# Patient Record
Sex: Female | Born: 1975 | Race: Black or African American | Hispanic: No | Marital: Married | Smoking: Former smoker
Health system: Southern US, Community
[De-identification: ages and names within clinical notes are randomized; demographics above are authoritative.]

## PROBLEM LIST (undated history)

## (undated) DIAGNOSIS — Z87448 Personal history of other diseases of urinary system: Secondary | ICD-10-CM

## (undated) DIAGNOSIS — N926 Irregular menstruation, unspecified: Secondary | ICD-10-CM

## (undated) DIAGNOSIS — K219 Gastro-esophageal reflux disease without esophagitis: Secondary | ICD-10-CM

## (undated) DIAGNOSIS — R7301 Impaired fasting glucose: Secondary | ICD-10-CM

## (undated) DIAGNOSIS — IMO0002 Reserved for concepts with insufficient information to code with codable children: Secondary | ICD-10-CM

## (undated) DIAGNOSIS — G43709 Chronic migraine without aura, not intractable, without status migrainosus: Secondary | ICD-10-CM

## (undated) DIAGNOSIS — G5600 Carpal tunnel syndrome, unspecified upper limb: Secondary | ICD-10-CM

## (undated) DIAGNOSIS — Z973 Presence of spectacles and contact lenses: Secondary | ICD-10-CM

## (undated) DIAGNOSIS — J309 Allergic rhinitis, unspecified: Secondary | ICD-10-CM

## (undated) DIAGNOSIS — E785 Hyperlipidemia, unspecified: Secondary | ICD-10-CM

## (undated) DIAGNOSIS — I10 Essential (primary) hypertension: Secondary | ICD-10-CM

## (undated) HISTORY — DX: Essential (primary) hypertension: I10

## (undated) HISTORY — DX: Reserved for concepts with insufficient information to code with codable children: IMO0002

## (undated) HISTORY — DX: Chronic migraine without aura, not intractable, without status migrainosus: G43.709

## (undated) HISTORY — DX: Carpal tunnel syndrome, unspecified upper limb: G56.00

## (undated) HISTORY — DX: Impaired fasting glucose: R73.01

## (undated) HISTORY — DX: Gastro-esophageal reflux disease without esophagitis: K21.9

## (undated) HISTORY — PX: CERVICAL DISC ARTHROPLASTY: SHX587

## (undated) HISTORY — DX: Allergic rhinitis, unspecified: J30.9

## (undated) HISTORY — DX: Hyperlipidemia, unspecified: E78.5

## (undated) HISTORY — DX: Personal history of other diseases of urinary system: Z87.448

## (undated) HISTORY — DX: Irregular menstruation, unspecified: N92.6

## (undated) HISTORY — PX: BACK SURGERY: SHX140

---

## 2014-08-14 LAB — HM PAP SMEAR

## 2014-08-23 ENCOUNTER — Ambulatory Visit: Payer: Self-pay | Admitting: Family Medicine

## 2014-08-23 LAB — HM MAMMOGRAPHY

## 2014-09-08 ENCOUNTER — Ambulatory Visit: Payer: Self-pay | Admitting: Family Medicine

## 2015-01-31 ENCOUNTER — Ambulatory Visit: Payer: Self-pay

## 2015-02-01 ENCOUNTER — Other Ambulatory Visit: Payer: Self-pay | Admitting: Family Medicine

## 2015-02-01 DIAGNOSIS — R928 Other abnormal and inconclusive findings on diagnostic imaging of breast: Secondary | ICD-10-CM

## 2015-02-05 ENCOUNTER — Ambulatory Visit: Payer: Self-pay

## 2015-02-06 ENCOUNTER — Telehealth: Payer: Self-pay | Admitting: Family Medicine

## 2015-02-06 DIAGNOSIS — R928 Other abnormal and inconclusive findings on diagnostic imaging of breast: Secondary | ICD-10-CM

## 2015-02-06 NOTE — Telephone Encounter (Signed)
-----   Message from Arnetha Courser, MD sent at 02/01/2015 11:22 AM EDT ----- Regarding: Due for 6 month f/u LEFT breast mammo and [place] Due for LEFT breast mammo and [place] around February 26, 2015

## 2015-02-06 NOTE — Telephone Encounter (Signed)
Ok, per [facility], we still have to place the order for f/u and we still have to call them after we've placed the order to get her scheduled. They do NOT call the patient to schedule follow up mammo and ultrasounds.

## 2015-02-06 NOTE — Telephone Encounter (Signed)
Patient due for f/u breast imaging around June 27th Please make sure imaging center is taking care of this, or do we need to enter orders through EPIC? Thank you

## 2015-02-07 ENCOUNTER — Ambulatory Visit: Payer: BC Managed Care – PPO | Attending: Family Medicine

## 2015-02-07 DIAGNOSIS — G5701 Lesion of sciatic nerve, right lower limb: Secondary | ICD-10-CM | POA: Diagnosis present

## 2015-02-07 DIAGNOSIS — M545 Low back pain: Secondary | ICD-10-CM | POA: Insufficient documentation

## 2015-02-07 DIAGNOSIS — M5431 Sciatica, right side: Secondary | ICD-10-CM | POA: Insufficient documentation
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
## 2015-02-07 NOTE — Patient Instructions (Signed)
Patient was instructed to use a lumbar towel support when sitting on a chair. Patient verbalized understanding.   Improved exercise technique, movement at target joints, use of target muscles after mod verbal, visual, tactile cues.

## 2015-02-12 ENCOUNTER — Ambulatory Visit: Payer: BC Managed Care – PPO

## 2015-02-12 DIAGNOSIS — M545 Low back pain: Secondary | ICD-10-CM | POA: Diagnosis not present

## 2015-02-12 DIAGNOSIS — G5701 Lesion of sciatic nerve, right lower limb: Secondary | ICD-10-CM

## 2015-02-12 DIAGNOSIS — M5431 Sciatica, right side: Secondary | ICD-10-CM

## 2015-02-12 NOTE — Therapy (Signed)
[place] [facility] [address] [place], [address] Phone: [phone]   Fax:  [phone]  Physical Therapy Treatment  Patient Details  Name: Carrie Cross MRN: [id number] Date of Birth: [birth year] Referring Provider:  Arnetha Courser, MD  Encounter Date: 02/12/2015      PT End of Session - 02/12/15 1547    Visit Number 2   Number of Visits 13   Date for PT Re-Evaluation 03/21/15   PT Start Time 6812   PT Stop Time 1645   PT Time Calculation (min) 60 min   Activity Tolerance Patient tolerated treatment well   Behavior During Therapy [facility] for tasks assessed/performed      Past Medical History  Diagnosis Date  . Hypertension     Controlled per pt    No past surgical history on file.  There were no vitals filed for this visit.  Visit Diagnosis:  Piriformis syndrome, right  Sciatica, right  Bilateral low back pain, with sciatica presence unspecified      Subjective Assessment - 02/12/15 1609    Subjective 4/10 R hip and LE pain currently. R hip and LE pain felt better after last session.    Patient Stated Goals Patient states she wants to be better able to bring her R leg up. Also expresses the desire to decrease her pain.    Currently in Pain? Yes   Pain Score 4    Pain Location Other (Comment)  Posterior hip, thigh, lateral leg, foot   Pain Orientation Right   Pain Onset More than a month ago   Pain Frequency Constant   Multiple Pain Sites Yes          [facility] Adult PT Treatment/Exercise - 02/12/15 1610    Lumbar Exercises: Standing   Other Standing Lumbar Exercises Standing bilateral scapular retraction with red band 10x5 seconds for 3 sets, standing wobble board ankle DF/PF x 45 seconds   Lumbar Exercises: Seated   Other Seated Lumbar Exercises Sitting with lumbar towel roll x 3 min, sitting on pysioball anterior to neutral pelvic rocking 10x, seated L hip extension isometrics 10x2  with 5 second holds, seated R hip extension isometrics 10x3 with 5 second holds, seated R piriformis stretch 30 seconds x 3.    Lumbar Exercises: Prone   Other Prone Lumbar Exercises Pone glute max/quad set 10x3 with 5 second holds, pone manual R piriformis stretch 30 seconds x 3, prone on elbows with 10 exhales.    Manual Therapy   Manual therapy comments Prone P to A to R L 5, L4, L3 transverse process grade 3- to 3+.           PT Education - 02/12/15 2108    Education provided Yes   Education Details ther-ex, HEP   Person(s) Educated Patient   Methods Explanation;Demonstration;Tactile cues;Verbal cues;Handout   Comprehension Verbalized understanding;Returned demonstration             PT Long Term Goals - 02/07/15 1943    PT LONG TERM GOAL #1   Title Patient will be independent with her HEP to help decrease back pain, improve ability to sit, sleep, and laugh.    Time 6   Period Weeks   Status New   PT LONG TERM GOAL #2   Title Patient will have a decrease in back pain to 4/10 or less at worst to improve ability to tolerate sitting for longer durations to perform sitting tasks  Time 6   Period Weeks   Status New   PT LONG TERM GOAL #3   Title Patient will improve her Modified Oswestry Low Back Pain Disability Questionnaire by at least 6 points as a demonstration of improved function.    Time 6   Period Weeks   Status New   PT LONG TERM GOAL #4   Title Patient will improve bilateral glute max strength by at least 1/2 MMT to decrease overuse of R piriformis muscle and to help decrease R LE pain.    Time 6   Period Weeks   Status New               Plan - 02/12/15 1636    Clinical Impression Statement Decreased R LE pain to 1/10 after manual therapy to R L3-5 transverse processes. Increased R LE symptoms with prone on elbows with deep breaths, pelvic rocks on physioball, prone glute max/quad sets, standing ankle DF/PF. Slight decrease in R LE pain after seated R  hip extension isometrics. Decreased to 1/10 after sitting piriformis stretch.    Pt will benefit from skilled therapeutic intervention in order to improve on the following deficits Decreased range of motion;Difficulty walking;Pain;Hypomobility;Improper body mechanics;Impaired flexibility;Decreased strength   Rehab Potential Good   Clinical Impairments Affecting Rehab Potential pain   PT Frequency 2x / week   PT Duration 6 weeks   PT Treatment/Interventions ADLs/Self Care Home Management;Aquatic Therapy;Traction;Neuromuscular re-education;Gait training;Patient/family education;Passive range of motion;Cryotherapy;Electrical Stimulation;Iontophoresis 4mg /ml Dexamethasone;Therapeutic activities;Manual techniques;Therapeutic exercise;Moist Heat   PT Next Visit Plan R piriformis stretch, R glute max strengthening in seated, lumbar and thoracic mobility   Consulted and Agree with Plan of Care Patient        Problem List There are no active problems to display for this patient.   Tc Kapusta 02/12/2015, 9:18 PM  [place] [facility] [address], [place], [address] Phone: [phone]   Fax:  [phone]

## 2015-02-12 NOTE — Patient Instructions (Addendum)
  Piriformis Stretch, Sitting   Sit, one ankle on opposite knee,crossed-leg position. Hold __30_ seconds.  Repeat _3__ times per session. Do __2_ sessions per day.  Copyright  VHI. All rights reserved.    Improved exercise technique, movement at target joints, use of target muscles after mod verbal, visual, tactile cues.

## 2015-02-14 ENCOUNTER — Ambulatory Visit: Payer: BC Managed Care – PPO

## 2015-02-14 DIAGNOSIS — M545 Low back pain: Secondary | ICD-10-CM | POA: Diagnosis not present

## 2015-02-14 DIAGNOSIS — M5431 Sciatica, right side: Secondary | ICD-10-CM

## 2015-02-14 DIAGNOSIS — G5701 Lesion of sciatic nerve, right lower limb: Secondary | ICD-10-CM

## 2015-02-14 NOTE — Therapy (Signed)
[place] [facility] [address] [place], [address] Phone: [phone]   Fax:  [phone]  Physical Therapy Treatment  Patient Details  Name: Carrie Cross MRN: [id number] Date of Birth: 1976-07-25 Referring Provider:  Arnetha Courser, MD  Encounter Date: 02/14/2015      PT End of Session - 02/14/15 1540    Visit Number 3   Number of Visits 13   Date for PT Re-Evaluation 03/21/15   PT Start Time 1540   PT Stop Time 1644   PT Time Calculation (min) 64 min   Activity Tolerance Patient tolerated treatment well;Patient limited by pain   Behavior During Therapy [facility] for tasks assessed/performed      Past Medical History  Diagnosis Date  . Hypertension     Controlled per pt    No past surgical history on file.  There were no vitals filed for this visit.  Visit Diagnosis:  Bilateral low back pain, with sciatica presence unspecified  Sciatica, right  Piriformis syndrome, right      Subjective Assessment - 02/14/15 1542    Subjective 3-4/10 R side current, 7/10 yesterday constant since waking up with it in the morning. Sleeping is hard for her because of her pain.    Patient Stated Goals Patient states she wants to be better able to bring her R leg up. Also expresses the desire to decrease her pain.    Currently in Pain? Yes   Pain Score 4                          [facility] Adult PT Treatment/Exercise - 02/14/15 1608    Exercises   Other Exercises  Directed patient with seated R piriformis stretch 30 seconds x3, supine manual R piriformis stretch (by PT) 6x15 seconds, supine manually resisted R hip extension isometrics at end range hip flexion 10x3 with 5 seconds, supine manually resisted clam shell in neutral 10x3 with 5 seconds, supine bilateral shoulder extension isometrics in supine 10x5 seconds, supine hip clam shell resisting yellow band to neutral 10x3. supine hooklying position  with lumbar towel roll x 2 min, the with legs straight x 4 min, sitting with lumbar towel roll x 4 min (2 min no charge)   Manual Therapy   Manual therapy comments supine manual lumbar traction                PT Education - 02/14/15 1636    Education provided Yes   Education Details ther-ex, HEP   Person(s) Educated Patient   Methods Explanation;Demonstration;Tactile cues;Verbal cues   Comprehension Verbalized understanding;Returned demonstration             PT Long Term Goals - 02/07/15 1943    PT LONG TERM GOAL #1   Title Patient will be independent with her HEP to help decrease back pain, improve ability to sit, sleep, and laugh.    Time 6   Period Weeks   Status New   PT LONG TERM GOAL #2   Title Patient will have a decrease in back pain to 4/10 or less at worst to improve ability to tolerate sitting for longer durations to perform sitting tasks   Time 6   Period Weeks   Status New   PT LONG TERM GOAL #3   Title Patient will improve her Modified Oswestry Low Back Pain Disability Questionnaire by at least 6 points as a demonstration of improved  function.    Time 6   Period Weeks   Status New   PT LONG TERM GOAL #4   Title Patient will improve bilateral glute max strength by at least 1/2 MMT to decrease overuse of R piriformis muscle and to help decrease R LE pain.    Time 6   Period Weeks   Status New               Plan - 02/14/15 1628    Clinical Impression Statement Increased R LE symptoms with supine clamshells past neutral with yellow band, manual lumbar traction, bridge. Increased pain with supine to sit when getting up from the L side of the mat table. No symptoms with supine clamshell isometrics at neutral, or supine position with lumbar towel roll (for gentle lumbar extension which decreased R LE symptoms to 1-2/10).  Demonstrates disc, right L5/S1 joint  and R piriformis involvement.  Disc involvement more predominant today.  Decreased symptoms  to 1/10  with sitting with lumbar towel roll as well.    Pt will benefit from skilled therapeutic intervention in order to improve on the following deficits Decreased range of motion;Difficulty walking;Pain;Hypomobility;Improper body mechanics;Impaired flexibility;Decreased strength   Rehab Potential Good   Clinical Impairments Affecting Rehab Potential pain   PT Frequency 2x / week   PT Treatment/Interventions ADLs/Self Care Home Management;Aquatic Therapy;Traction;Neuromuscular re-education;Gait training;Patient/family education;Passive range of motion;Cryotherapy;Electrical Stimulation;Iontophoresis 4mg /ml Dexamethasone;Therapeutic activities;Manual techniques;Therapeutic exercise;Moist Heat   PT Next Visit Plan R piriformis stretch, R glute max strengthening in seated, lumbar and thoracic mobility   Consulted and Agree with Plan of Care Patient        Problem List There are no active problems to display for this patient.   Azul Coffie 02/14/2015, 4:59 PM  [place] [facility] [address], [place], [address] Phone: [phone]   Fax:  [phone]

## 2015-02-14 NOTE — Patient Instructions (Signed)
Patient was instructed to try lying on her back with a towel roll behind her low back to help her sleep. Patient verbalized understanding.    Improved exercise technique, movement at target joints, use of target muscles after mod verbal, visual, tactile cues.

## 2015-02-19 ENCOUNTER — Ambulatory Visit: Payer: BC Managed Care – PPO

## 2015-02-19 ENCOUNTER — Other Ambulatory Visit: Payer: Self-pay | Admitting: Family Medicine

## 2015-02-19 DIAGNOSIS — M545 Low back pain: Secondary | ICD-10-CM

## 2015-02-19 DIAGNOSIS — G5701 Lesion of sciatic nerve, right lower limb: Secondary | ICD-10-CM

## 2015-02-19 DIAGNOSIS — M5431 Sciatica, right side: Secondary | ICD-10-CM

## 2015-02-19 NOTE — Patient Instructions (Signed)
Gave L S/L R trunk rotation with L LE straight static position and R S/L with towel roll or pillow under R trunk at lumbar area static hold as part of her HEP to help her sleep. Patient demonstrated and verbalized understanding.   Improved exercise technique, movement at target joints, use of target muscles after mod verbal, visual, tactile cues.

## 2015-02-19 NOTE — Therapy (Signed)
[place] [facility] [address] [place], [address] Phone: [phone]   Fax:  [phone]  Physical Therapy Treatment  Patient Details  Name: Carrie Cross MRN: [id number] Date of Birth: [birth year] Referring Provider:  Arnetha Courser, MD  Encounter Date: 02/19/2015      PT End of Session - 02/19/15 1552    Visit Number 4   Number of Visits 13   Date for PT Re-Evaluation 03/21/15   PT Start Time 1548   PT Stop Time 1643   PT Time Calculation (min) 55 min   Activity Tolerance Patient tolerated treatment well;Patient limited by pain   Behavior During Therapy [facility] for tasks assessed/performed      Past Medical History  Diagnosis Date  . Hypertension     Controlled per pt    No past surgical history on file.  There were no vitals filed for this visit.  Visit Diagnosis:  Sciatica, right  Piriformis syndrome, right  Bilateral low back pain, with sciatica presence unspecified      Subjective Assessment - 02/19/15 1553    Subjective 5/10 current R LE. Sleeps on her L side. Has difficulty sleeping becuase of the pain.    Patient Stated Goals Patient states she wants to be better able to bring her R leg up. Also expresses the desire to decrease her pain.    Currently in Pain? Yes   Pain Score 5    Pain Location --  Posterior hip, thigh, lateral leg, foot   Pain Orientation Right   Pain Onset More than a month ago   Multiple Pain Sites Yes                         [facility] Adult PT Treatment/Exercise - 02/19/15 1600    Exercises   Other Exercises  Directed patient with sitting with lumbar towel roll behind back for 6 minutes, L S/L position with towel roll under L trunk x 2 min, L S/L static bow and arrow position (R trunk and lumbar rotation with L LE straight) x 8 min, then with R ankle DF/PF 10x3, R S/L with towel roll under trunk x 8 minutes, then with R ankle DF/PF 10x3, standing R  and L back extension gently 6x each side, standing bilateral shoulder extension resisting yellow band 10x 5 seconds, sitting R piriformis stretch with lumbar towel roll 3x 30 seconds R LE, seated bilateral hip adduction pillow squeeze 10x3 with 5 second holds, standing mini squats 10x3 with emphasis on femoral control and glute use (and knees not going over toes)                PT Education - 02/19/15 1633    Education provided Yes   Education Details ther-ex, HEP   Person(s) Educated Patient   Methods Explanation;Demonstration;Tactile cues;Verbal cues;Handout   Comprehension Verbalized understanding;Returned demonstration             PT Long Term Goals - 02/07/15 1943    PT LONG TERM GOAL #1   Title Patient will be independent with her HEP to help decrease back pain, improve ability to sit, sleep, and laugh.    Time 6   Period Weeks   Status New   PT LONG TERM GOAL #2   Title Patient will have a decrease in back pain to 4/10 or less at worst to improve ability to tolerate sitting for longer durations to perform  sitting tasks   Time 6   Period Weeks   Status New   PT LONG TERM GOAL #3   Title Patient will improve her Modified Oswestry Low Back Pain Disability Questionnaire by at least 6 points as a demonstration of improved function.    Time 6   Period Weeks   Status New   PT LONG TERM GOAL #4   Title Patient will improve bilateral glute max strength by at least 1/2 MMT to decrease overuse of R piriformis muscle and to help decrease R LE pain.    Time 6   Period Weeks   Status New               Plan - 02/19/15 1627    Clinical Impression Statement R LE pain decreased to 4/10 after sitting with lumbar towel roll, decreased to 2/10 after L S/L L LE straight with R trunk rotation, decreased to 1/10 after R S/L with towel roll under R side at lumbar spine. R LE pain 1/10 at end of session. Patient tolerated static positions at beginning of therapy well.    Pt  will benefit from skilled therapeutic intervention in order to improve on the following deficits Decreased range of motion;Difficulty walking;Pain;Hypomobility;Improper body mechanics;Impaired flexibility;Decreased strength   Rehab Potential Good   Clinical Impairments Affecting Rehab Potential pain   PT Frequency 2x / week   PT Duration 6 weeks   PT Treatment/Interventions ADLs/Self Care Home Management;Aquatic Therapy;Traction;Neuromuscular re-education;Gait training;Patient/family education;Passive range of motion;Cryotherapy;Electrical Stimulation;Iontophoresis 4mg /ml Dexamethasone;Therapeutic activities;Manual techniques;Therapeutic exercise;Moist Heat   PT Next Visit Plan static positions to decrease disc symptoms followed by neural flossing and active exercises   Consulted and Agree with Plan of Care Patient        Problem List There are no active problems to display for this patient.   Halah Whiteside 02/19/2015, 5:05 PM  [place] [facility] [address] [place], [address] Phone: [phone]   Fax:  [phone]

## 2015-02-19 NOTE — Telephone Encounter (Signed)
E-Fax came through for refill on: Rx: omeprazole (PRILOSEC) 20 MG capsule Rx in basket

## 2015-02-19 NOTE — Telephone Encounter (Signed)
Routing to provider  

## 2015-02-21 ENCOUNTER — Ambulatory Visit: Payer: BC Managed Care – PPO

## 2015-02-21 DIAGNOSIS — M5431 Sciatica, right side: Secondary | ICD-10-CM

## 2015-02-21 DIAGNOSIS — M545 Low back pain: Secondary | ICD-10-CM

## 2015-02-21 DIAGNOSIS — G5701 Lesion of sciatic nerve, right lower limb: Secondary | ICD-10-CM

## 2015-02-21 MED ORDER — OMEPRAZOLE 20 MG PO CPDR: 20.0000 mg | DELAYED_RELEASE_CAPSULE | Freq: Every day | ORAL | Status: DC | PRN

## 2015-02-21 NOTE — Therapy (Signed)
[place] [facility] [address], [place], [address] Phone: [phone]   Fax:  [phone]  Physical Therapy Treatment  Patient Details  Name: Carrie Cross MRN: [id number] Date of Birth: [birth year] Referring Provider:  Arnetha Courser, MD  Encounter Date: 02/21/2015      PT End of Session - 02/21/15 1549    Visit Number 5   Number of Visits 13   Date for PT Re-Evaluation 03/21/15   PT Start Time 3220   PT Stop Time 1640   PT Time Calculation (min) 51 min   Activity Tolerance Patient tolerated treatment well;Patient limited by pain   Behavior During Therapy [facility] for tasks assessed/performed      Past Medical History  Diagnosis Date  . Hypertension     Controlled per pt    No past surgical history on file.  There were no vitals filed for this visit.  Visit Diagnosis:  Sciatica, right  Piriformis syndrome, right  Bilateral low back pain, with sciatica presence unspecified      Subjective Assessment - 02/21/15 1553    Subjective 2/10 current. This morning was a 5/10. Better able to sleep longer. Back pain however steadily coming back up.    Patient Stated Goals Patient states she wants to be better able to bring her R leg up. Also expresses the desire to decrease her pain.    Currently in Pain? Yes   Pain Score 2    Pain Location Back   Multiple Pain Sites Yes                         [facility] Adult PT Treatment/Exercise - 02/21/15 1557    Exercises   Other Exercises  Directed patient with L S/L with pillow under lumbar spine 3.5 min, R S/L with R LE straight, with static L trunk rotation position x 4 min, then with R ankle DF/PF 30x, seated R piriformis stretch with lumbar towel roll 30 seconds x 3, standing bilateral shoulder extension with yellow band 10x3 with 5 second holds. standing mini squats 10x3, wedding march holding onto 8 lbs dumbells each hand 32 ft x 4, wobble board  ankle DF/PF 2 min   Manual Therapy   Manual therapy comments Prone UPA to R sacral ala, L5 and L4 transverse processes grade 3- to 3+  decreased back pain afterwards                PT Education - 02/21/15 1624    Education provided Yes   Education Details ther-ex   [employer]) Educated Patient   Methods Explanation;Demonstration;Verbal cues;Tactile cues   Comprehension Returned demonstration             PT Long Term Goals - 02/07/15 1943    PT LONG TERM GOAL #1   Title Patient will be independent with her HEP to help decrease back pain, improve ability to sit, sleep, and laugh.    Time 6   Period Weeks   Status New   PT LONG TERM GOAL #2   Title Patient will have a decrease in back pain to 4/10 or less at worst to improve ability to tolerate sitting for longer durations to perform sitting tasks   Time 6   Period Weeks   Status New   PT LONG TERM GOAL #3   Title Patient will improve her Modified Oswestry Low Back Pain Disability Questionnaire by at least 6  points as a demonstration of improved function.    Time 6   Period Weeks   Status New   PT LONG TERM GOAL #4   Title Patient will improve bilateral glute max strength by at least 1/2 MMT to decrease overuse of R piriformis muscle and to help decrease R LE pain.    Time 6   Period Weeks   Status New               Plan - 02/21/15 1625    Clinical Impression Statement Decreased R LE pain to 1/10 after R S/L with R LE straight and L trunk rotation. Decreased R LE pain after manual therapy to R low back. No R LE pain after session. Patient making progress towards goals.    Pt will benefit from skilled therapeutic intervention in order to improve on the following deficits Decreased range of motion;Difficulty walking;Pain;Hypomobility;Improper body mechanics;Impaired flexibility;Decreased strength   Rehab Potential Good   Clinical Impairments Affecting Rehab Potential pain   PT Frequency 2x / week   PT  Duration 6 weeks   PT Treatment/Interventions ADLs/Self Care Home Management;Aquatic Therapy;Traction;Neuromuscular re-education;Gait training;Patient/family education;Passive range of motion;Cryotherapy;Electrical Stimulation;Iontophoresis 4mg /ml Dexamethasone;Therapeutic activities;Manual techniques;Therapeutic exercise;Moist Heat   PT Next Visit Plan static positions to decrease disc symptoms followed by neural flossing and active exercises   Consulted and Agree with Plan of Care Patient        Problem List There are no active problems to display for this patient.   Laygo,Miguel 02/21/2015, 4:45 PM  [place] [facility] [address], [place], [address] Phone: [phone]   Fax:  [phone]

## 2015-02-21 NOTE — Patient Instructions (Signed)
Improved exercise technique, movement at target joints, use of target muscles after mod verbal, visual, tactile cues.   

## 2015-02-26 ENCOUNTER — Ambulatory Visit: Payer: BC Managed Care – PPO

## 2015-02-26 DIAGNOSIS — M5431 Sciatica, right side: Secondary | ICD-10-CM

## 2015-02-26 DIAGNOSIS — M545 Low back pain: Secondary | ICD-10-CM

## 2015-02-26 DIAGNOSIS — G5701 Lesion of sciatic nerve, right lower limb: Secondary | ICD-10-CM

## 2015-02-26 NOTE — Patient Instructions (Addendum)
(  Home) Hip: Internal Rotation - Sitting    Pull right foot out to the side. May hold chair to keep body still. Repeat __10__ times per set. Do ___3_ sets per session. Do __1__ session per day Use __0__ lb weights.  Copyright  VHI. All rights reserved.    Improved exercise technique, movement at target joints, use of target muscles after mod verbal, visual, tactile cues.

## 2015-02-27 ENCOUNTER — Telehealth: Payer: Self-pay

## 2015-02-27 DIAGNOSIS — R928 Other abnormal and inconclusive findings on diagnostic imaging of breast: Secondary | ICD-10-CM

## 2015-02-27 NOTE — Therapy (Signed)
[place] [facility] [address] [place], [address] Phone: [phone]   Fax:  [phone]  Physical Therapy Treatment  Patient Details  Name: Carrie Cross MRN: [id number] Date of Birth: 01-16-1976 Referring Provider:  Arnetha Courser, MD  Encounter Date: 02/26/2015      PT End of Session - 02/26/15 1541    Visit Number 6   Number of Visits 13   Date for PT Re-Evaluation 03/21/15   PT Start Time 6962   PT Stop Time 1635   PT Time Calculation (min) 53 min   Activity Tolerance Patient tolerated treatment well;Patient limited by pain   Behavior During Therapy [facility] for tasks assessed/performed      Past Medical History  Diagnosis Date  . Hypertension     Controlled per pt    No past surgical history on file.  There were no vitals filed for this visit.  Visit Diagnosis:  Sciatica, right  Piriformis syndrome, right  Bilateral low back pain, with sciatica presence unspecified      Subjective Assessment - 02/26/15 1546    Subjective Pt states that her R LE was fine after last session until that night in which her R LE was an 8/10. Sleeping is off and on but the length of time sleeping at once is a little longer.    Patient Stated Goals Patient states she wants to be better able to bring her R leg up. Also expresses the desire to decrease her pain.    Currently in Pain? Yes   Pain Score 5    Pain Location --  back, R LE   Pain Orientation Right   Multiple Pain Sites Yes            [facility] Adult PT Treatment/Exercise - 02/26/15 1550    Exercises   Other Exercises  Directed patient with sitting with lumbar towel roll x 2 minutes, then with R piriformis stretch 30 seconds x 3, R S/L with R LE straight and static L trunk rotation 4 min, prone glute max squeeze with R knee bent 10x with 5 second holds, then with R thigh propped with pillow 10x2 with 5 second holds. seated R hip IR 10x3, standing  wobble board ankle DF/PF 2 min.    Manual Therapy   Manual therapy comments Prone UPA to R sacral ala, L5 and L4 transverse processes grade 3- to 3+, prone R piriformis stretch with muscle energy technique to increase piriformis ROM.            PT Education - 02/27/15 1736    Education provided Yes   Education Details ther-ex, HEP   Person(s) Educated Patient   Methods Explanation;Demonstration;Tactile cues;Verbal cues;Handout   Comprehension Verbalized understanding;Returned demonstration             PT Long Term Goals - 02/07/15 1943    PT LONG TERM GOAL #1   Title Patient will be independent with her HEP to help decrease back pain, improve ability to sit, sleep, and laugh.    Time 6   Period Weeks   Status New   PT LONG TERM GOAL #2   Title Patient will have a decrease in back pain to 4/10 or less at worst to improve ability to tolerate sitting for longer durations to perform sitting tasks   Time 6   Period Weeks   Status New   PT LONG TERM GOAL #3   Title Patient will improve  her Modified Oswestry Low Back Pain Disability Questionnaire by at least 6 points as a demonstration of improved function.    Time 6   Period Weeks   Status New   PT LONG TERM GOAL #4   Title Patient will improve bilateral glute max strength by at least 1/2 MMT to decrease overuse of R piriformis muscle and to help decrease R LE pain.    Time 6   Period Weeks   Status New               Plan - 02/26/15 1638    Clinical Impression Statement Decreased R LE pain with prone R piriformis stretch and seated R hip IR exercise   Pt will benefit from skilled therapeutic intervention in order to improve on the following deficits Decreased range of motion;Difficulty walking;Pain;Hypomobility;Improper body mechanics;Impaired flexibility;Decreased strength   Rehab Potential Good   Clinical Impairments Affecting Rehab Potential pain   PT Frequency 2x / week   PT Duration 6 weeks   PT  Treatment/Interventions ADLs/Self Care Home Management;Aquatic Therapy;Traction;Neuromuscular re-education;Gait training;Patient/family education;Passive range of motion;Cryotherapy;Electrical Stimulation;Iontophoresis 4mg /ml Dexamethasone;Therapeutic activities;Manual techniques;Therapeutic exercise;Moist Heat   PT Next Visit Plan static positions to decrease disc symptoms followed by neural flossing and active exercises, pirifomis stretching   Consulted and Agree with Plan of Care Patient        Problem List There are no active problems to display for this patient.   Ghazal Pevey 02/27/2015, 5:39 PM  [place] [facility] [address], [place], [address] Phone: [phone]   Fax:  [phone]

## 2015-02-27 NOTE — Telephone Encounter (Signed)
She is not pregnant.

## 2015-02-27 NOTE — Telephone Encounter (Signed)
-----   Message from Arnetha Courser, MD sent at 02/27/2015 12:28 PM EDT ----- Regarding: FW: Time to order breast imaging for 6 month f/u Please contact patient, ask about pregnancy, enter orders and I'll co-sign (please); thank you, Dr. Sanda Klein ----- Message -----    From: Arnetha Courser, MD    Sent: 02/06/2015   2:56 PM      To: Arnetha Courser, MD Subject: Time to order breast imaging for 6 month f/u   Left breast US and diag mammo due around July 8th; ask patient first before we order if she is / could be pregnant, then back to me to order

## 2015-02-28 ENCOUNTER — Ambulatory Visit: Payer: BC Managed Care – PPO

## 2015-02-28 DIAGNOSIS — M545 Low back pain: Secondary | ICD-10-CM

## 2015-02-28 DIAGNOSIS — M5431 Sciatica, right side: Secondary | ICD-10-CM

## 2015-02-28 DIAGNOSIS — G5701 Lesion of sciatic nerve, right lower limb: Secondary | ICD-10-CM

## 2015-02-28 NOTE — Therapy (Signed)
[place] [facility] [address] [place], [address] Phone: [phone]   Fax:  [phone]  Physical Therapy Treatment  Patient Details  Name: Carrie Cross MRN: [id number] Date of Birth: November 11, 1975 Referring Provider:  Arnetha Courser, MD  Encounter Date: 02/28/2015      PT End of Session - 02/28/15 1552    Visit Number 7   Number of Visits 13   Date for PT Re-Evaluation 03/21/15   PT Start Time 4401   PT Stop Time 1630   PT Time Calculation (min) 38 min   Activity Tolerance Patient tolerated treatment well;Patient limited by pain   Behavior During Therapy [facility] for tasks assessed/performed      Past Medical History  Diagnosis Date  . Hypertension     Controlled per pt    No past surgical history on file.  There were no vitals filed for this visit.  Visit Diagnosis:  Sciatica, right  Piriformis syndrome, right  Bilateral low back pain, with sciatica presence unspecified      Subjective Assessment - 02/28/15 1553    Subjective 4/10 currently R LE. The exercises last session helped but when she got to her car her and sat down, her sympoms increased. Getting out of the car usually bothers her R LE more than being in the car.    Patient Stated Goals Patient states she wants to be better able to bring her R leg up. Also expresses the desire to decrease her pain.    Currently in Pain? Yes   Pain Score 4    Pain Location --  R LE   Multiple Pain Sites Yes                         [facility] Adult PT Treatment/Exercise - 02/28/15 1615    Exercises   Other Exercises  Directed patient with seated exercises in car: seated hip adductor towel  squeeze 10x3 with 5 second holds, seated R hip extension isometrics 10x3 with 5 second holds, ergonomic car transfer with emphasis on proper back posture (no lumbar flexion) 3x; in gym: prone on elbows 2 min x 2, prone glute max/quad set 10x 5 second  holds for 3 sets, ergonomic prone to L S/L to sit to stand 1x with cues for keeping back neutral and use of glute muscles.                 PT Education - 02/28/15 2355    Education provided Yes   Education Details ther-ex, HEP   Person(s) Educated Patient   Methods Explanation;Demonstration;Tactile cues;Verbal cues;Handout   Comprehension Verbalized understanding;Returned demonstration             PT Long Term Goals - 02/07/15 1943    PT LONG TERM GOAL #1   Title Patient will be independent with her HEP to help decrease back pain, improve ability to sit, sleep, and laugh.    Time 6   Period Weeks   Status New   PT LONG TERM GOAL #2   Title Patient will have a decrease in back pain to 4/10 or less at worst to improve ability to tolerate sitting for longer durations to perform sitting tasks   Time 6   Period Weeks   Status New   PT LONG TERM GOAL #3   Title Patient will improve her Modified Oswestry Low Back Pain Disability Questionnaire by at least 6 points  as a demonstration of improved function.    Time 6   Period Weeks   Status New   PT LONG TERM GOAL #4   Title Patient will improve bilateral glute max strength by at least 1/2 MMT to decrease overuse of R piriformis muscle and to help decrease R LE pain.    Time 6   Period Weeks   Status New               Plan - 02/28/15 1618    Clinical Impression Statement Decreased back pain with seated exercises in car as well as with ergonomic car transfers. 0/10 R LE pain after prone on elbows and prone glute/quad set. Decreased back pain when getting out of the prone position on the L side with proper back posture and use of glute max   Pt will benefit from skilled therapeutic intervention in order to improve on the following deficits Decreased range of motion;Difficulty walking;Pain;Hypomobility;Improper body mechanics;Impaired flexibility;Decreased strength   Rehab Potential Good   Clinical Impairments Affecting  Rehab Potential pain   PT Frequency 2x / week   PT Duration 6 weeks   PT Treatment/Interventions ADLs/Self Care Home Management;Aquatic Therapy;Traction;Neuromuscular re-education;Gait training;Patient/family education;Passive range of motion;Cryotherapy;Electrical Stimulation;Iontophoresis 4mg /ml Dexamethasone;Therapeutic activities;Manual techniques;Therapeutic exercise;Moist Heat   PT Next Visit Plan decrease piriformis activation, increase glute max muscle use, promote lumbar extension, neural flossing   Consulted and Agree with Plan of Care Patient        Problem List There are no active problems to display for this patient.   Laygo,Miguel 02/28/2015, 11:59 PM  [place] [facility] [address] [place], [address] Phone: [phone]   Fax:  [phone]

## 2015-02-28 NOTE — Patient Instructions (Addendum)
Quadriceps Set (Prone)   With legs supported with pillow and ankles relaxed (not shown), tighten thigh muscles to straighten knees. Hold __5__ seconds. Relax. Repeat ___10_ times per set. Do _3___ sets per session. Do ___2_ sessions per day.  http://orth.exer.us/727   Copyright  VHI. All rights reserved.    Improved exercise technique, movement at target joints, use of target muscles after mod verbal, visual, tactile cues.

## 2015-03-02 ENCOUNTER — Other Ambulatory Visit: Payer: Self-pay | Admitting: Family Medicine

## 2015-03-02 DIAGNOSIS — R928 Other abnormal and inconclusive findings on diagnostic imaging of breast: Secondary | ICD-10-CM

## 2015-03-08 ENCOUNTER — Ambulatory Visit: Payer: BC Managed Care – PPO | Attending: Family Medicine

## 2015-03-08 DIAGNOSIS — G5701 Lesion of sciatic nerve, right lower limb: Secondary | ICD-10-CM | POA: Insufficient documentation

## 2015-03-08 DIAGNOSIS — M545 Low back pain: Secondary | ICD-10-CM

## 2015-03-08 DIAGNOSIS — M5431 Sciatica, right side: Secondary | ICD-10-CM | POA: Diagnosis not present

## 2015-03-08 NOTE — Patient Instructions (Addendum)
On Elbows (Prone)   Rise up on elbows as high as possible, keeping hips on floor. Hold _2___ minutes Repeat __3__ times per set. Do __1__ sets per session. Do __2__ sessions per day.  http://orth.exer.us/93   Copyright  VHI. All rights reserved.    Improved exercise technique, movement at target joints, use of target muscles after mod verbal, visual, tactile cues.

## 2015-03-08 NOTE — Therapy (Signed)
[facility] [address] [place], [address] Phone: [phone]   Fax:  [phone]  Physical Therapy Treatment  Patient Details  Name: Carrie Cross MRN: [id number] Date of Birth: May 17, 1976 Referring Provider:  Arnetha Courser, MD  Encounter Date: 03/08/2015      PT End of Session - 03/08/15 0803    Visit Number 8   Number of Visits 13   Date for PT Re-Evaluation 03/21/15   PT Start Time 0803   PT Stop Time 0859   PT Time Calculation (min) 56 min   Activity Tolerance Patient tolerated treatment well;Patient limited by pain   Behavior During Therapy [facility] for tasks assessed/performed      Past Medical History  Diagnosis Date  . Hypertension     Controlled per pt    No past surgical history on file.  There were no vitals filed for this visit.  Visit Diagnosis:  Sciatica, right  Bilateral low back pain, with sciatica presence unspecified  Piriformis syndrome, right      Subjective Assessment - 03/08/15 0807    Subjective 2/10 currently R LE. Used a sleep aid from [employer] to help her sleep. 9/10 at most at night with any type of movement, even when she is laying on her back and slides over. Able to go to sleep but feels her pain when she moves in her sleep.    Patient Stated Goals Patient states she wants to be better able to bring her R leg up. Also expresses the desire to decrease her pain.    Currently in Pain? Yes   Pain Score 2    Multiple Pain Sites Yes                         [facility] Adult PT Treatment/Exercise - 03/08/15 0809    Exercises   Other Exercises  Directed patient with L lateral shift correction 10x5 seconds, then 10x10 seconds, prone on elbows 3x2 min, prone glute max set 10x2 with 5 second holds each LE, prone manual R piriformis stretch 10x10 seconds for 2 sets, prone assisted R glute max extension 10x2, prone on elbows with manual pressure to R low back  2 min x 2, prone press ups with manual pressure to R low back 3x   Manual Therapy   Manual therapy comments Prone UPA to R sacral ala, R L5 and L4 transverse processes grade 3 to 3+  Decreased R LE pain to 0.5/10                PT Education - 03/08/15 0912    Education provided Yes   Education Details ther-ex, HEP   Person(s) Educated Patient   Methods Explanation;Demonstration;Tactile cues;Verbal cues;Handout   Comprehension Verbalized understanding;Returned demonstration             PT Long Term Goals - 03/08/15 0913    PT LONG TERM GOAL #1   Title Patient will be independent with her HEP to help decrease back pain, improve ability to sit, sleep, and laugh.    Time 6   Period Weeks   Status On-going   PT LONG TERM GOAL #2   Title Patient will have a decrease in back pain to 4/10 or less at worst to improve ability to tolerate sitting for longer durations to perform sitting tasks   Time 6   Period Weeks   Status On-going   PT LONG  TERM GOAL #3   Title Patient will improve her Modified Oswestry Low Back Pain Disability Questionnaire by at least 6 points as a demonstration of improved function.    Time 6   Period Weeks   Status On-going   PT LONG TERM GOAL #4   Title Patient will improve bilateral glute max strength by at least 1/2 MMT to decrease overuse of R piriformis muscle and to help decrease R LE pain.    Time 6   Period Weeks   Status On-going               Plan - 03/08/15 2595    Clinical Impression Statement Decrease R LE symptoms to 0.1/10 after session (after manual therapy to R sacral ala, L5, L4 transverse processes followed by prone on elbows x 2 min). Tolerated treatment focusing on herniated disc well. Continue with promoting lumbar extenson and mobility.    Pt will benefit from skilled therapeutic intervention in order to improve on the following deficits Decreased range of motion;Difficulty walking;Pain;Hypomobility;Improper body  mechanics;Impaired flexibility;Decreased strength   Rehab Potential Good   Clinical Impairments Affecting Rehab Potential pain   PT Frequency 2x / week   PT Duration 6 weeks   PT Treatment/Interventions ADLs/Self Care Home Management;Aquatic Therapy;Traction;Neuromuscular re-education;Gait training;Patient/family education;Passive range of motion;Cryotherapy;Electrical Stimulation;Iontophoresis 4mg /ml Dexamethasone;Therapeutic activities;Manual techniques;Therapeutic exercise;Moist Heat   PT Next Visit Plan decrease piriformis activation, increase glute max muscle use, promote lumbar extension, neural flossing   Consulted and Agree with Plan of Care Patient        Problem List There are no active problems to display for this patient.   Ceonna Frazzini 03/08/2015, 9:16 AM  [place] [facility] [address], [place], [address] Phone: [phone]   Fax:  [phone]

## 2015-03-12 ENCOUNTER — Ambulatory Visit
Admission: RE | Admit: 2015-03-12 | Discharge: 2015-03-12 | Disposition: A | Discharge status: Home / Self Care | Payer: BC Managed Care – PPO | Source: Ambulatory Visit | Attending: Family Medicine | Admitting: Family Medicine

## 2015-03-12 ENCOUNTER — Ambulatory Visit: Payer: BC Managed Care – PPO

## 2015-03-12 DIAGNOSIS — R928 Other abnormal and inconclusive findings on diagnostic imaging of breast: Secondary | ICD-10-CM | POA: Insufficient documentation

## 2015-03-13 ENCOUNTER — Ambulatory Visit: Payer: BC Managed Care – PPO

## 2015-03-13 DIAGNOSIS — M5431 Sciatica, right side: Secondary | ICD-10-CM | POA: Diagnosis not present

## 2015-03-13 DIAGNOSIS — G5701 Lesion of sciatic nerve, right lower limb: Secondary | ICD-10-CM

## 2015-03-13 DIAGNOSIS — M545 Low back pain: Secondary | ICD-10-CM

## 2015-03-14 NOTE — Patient Instructions (Addendum)
Improved exercise technique, movement at target joints, use of target muscles after mod verbal, visual, tactile cues.    Modified her prone on elbows HEP with R side bend. Pt verbalized and demonstrated understanding.

## 2015-03-14 NOTE — Therapy (Signed)
[facility] [facility] [address], [place], [address] Phone: [phone]   Fax:  [phone]  Physical Therapy Treatment  Patient Details  Name: Carrie Cross MRN: [id number] Date of Birth: October 25, 1975 Referring Provider:  Arnetha Courser, MD  Encounter Date: 03/13/2015      PT End of Session - 03/13/15 1523    Visit Number 8   Number of Visits 13   Date for PT Re-Evaluation 03/21/15   PT Start Time 1520   PT Stop Time 1610   PT Time Calculation (min) 50 min   Activity Tolerance Patient tolerated treatment well;Patient limited by pain   Behavior During Therapy [facility] for tasks assessed/performed      Past Medical History  Diagnosis Date  . Hypertension     Controlled per pt    No past surgical history on file.  There were no vitals filed for this visit.  Visit Diagnosis:  Sciatica, right  Bilateral low back pain, with sciatica presence unspecified  Piriformis syndrome, right      Subjective Assessment - 03/13/15 1524    Subjective 4/10 R LE current, 8-9/10 at worst past 3 days. When she moves her sympoms are better. For the past few days, her R LE feels tight. Getting into and out of the car is better. Getting out of her bed bothers her back.    Patient Stated Goals Patient states she wants to be better able to bring her R leg up. Also expresses the desire to decrease her pain.    Currently in Pain? Yes   Pain Score 4    Multiple Pain Sites Yes                         [facility] Adult PT Treatment/Exercise - 03/13/15 1527    Exercises   Other Exercises  Directed patient with sitting on lumbar towel roll 2 min, seated R piriformis stretch about 1 min x3, prone assisted R glute max extension 10x3, prone on elbows x 2 min, prone position with R side bend 2 min x3 (given as part or her HEP as a modification of her regular prone on elbow exercise; pt demonstrated and verbalized  understanding). Pt was also recommended to continue performing her HEP such as prone on elbows each time she feels her symptoms to try to increase the duration of her relief after exercises. Pt verbalized understanding. Sitting on pillow under R hip x 2 min.    Manual Therapy   Manual therapy comments Prone R UPA to L 5,4,3,2 transverse process grade 3 to 3+  decreased to 0/10 in prone after manual therapy                 PT Education - 03/14/15 1116    Education provided Yes   Education Details ther-ex   [employer]) Educated Patient   Methods Explanation;Demonstration;Verbal cues   Comprehension Verbalized understanding;Returned demonstration             PT Long Term Goals - 03/08/15 0913    PT LONG TERM GOAL #1   Title Patient will be independent with her HEP to help decrease back pain, improve ability to sit, sleep, and laugh.    Time 6   Period Weeks   Status On-going   PT LONG TERM GOAL #2   Title Patient will have a decrease in back pain to 4/10 or less at worst to improve ability  to tolerate sitting for longer durations to perform sitting tasks   Time 6   Period Weeks   Status On-going   PT LONG TERM GOAL #3   Title Patient will improve her Modified Oswestry Low Back Pain Disability Questionnaire by at least 6 points as a demonstration of improved function.    Time 6   Period Weeks   Status On-going   PT LONG TERM GOAL #4   Title Patient will improve bilateral glute max strength by at least 1/2 MMT to decrease overuse of R piriformis muscle and to help decrease R LE pain.    Time 6   Period Weeks   Status On-going               Plan - 03/13/15 1611    Clinical Impression Statement Decreased R LE pain after manual therapy and exercises in prone. Symptoms returned when transitioning to sitting.    Pt will benefit from skilled therapeutic intervention in order to improve on the following deficits Decreased range of motion;Difficulty  walking;Pain;Hypomobility;Improper body mechanics;Impaired flexibility;Decreased strength   Rehab Potential Good   Clinical Impairments Affecting Rehab Potential pain   PT Frequency 2x / week   PT Duration 6 weeks   PT Treatment/Interventions ADLs/Self Care Home Management;Aquatic Therapy;Traction;Neuromuscular re-education;Gait training;Patient/family education;Passive range of motion;Cryotherapy;Electrical Stimulation;Iontophoresis 4mg /ml Dexamethasone;Therapeutic activities;Manual techniques;Therapeutic exercise;Moist Heat   PT Next Visit Plan decrease piriformis activation, increase glute max muscle use, promote lumbar extension, neural flossing   Consulted and Agree with Plan of Care Patient        Problem List There are no active problems to display for this patient.   Latrease Kunde 03/14/2015, 11:25 AM  [place] [facility] [address], [place], [address] Phone: [phone]   Fax:  [phone]

## 2015-03-15 ENCOUNTER — Ambulatory Visit: Payer: BC Managed Care – PPO

## 2015-03-15 DIAGNOSIS — M5431 Sciatica, right side: Secondary | ICD-10-CM | POA: Diagnosis not present

## 2015-03-15 DIAGNOSIS — M545 Low back pain: Secondary | ICD-10-CM

## 2015-03-15 DIAGNOSIS — G5701 Lesion of sciatic nerve, right lower limb: Secondary | ICD-10-CM

## 2015-03-15 NOTE — Patient Instructions (Addendum)
Log Roll   Lying on back, bend left knee and place left arm across chest. Roll all in one movement to the right. Reverse to roll to the left. Always move as one unit.   Copyright  VHI. All rights reserved.   Improved exercise technique, movement at target joints, use of target muscles after mod verbal, visual, tactile cues.

## 2015-03-15 NOTE — Therapy (Signed)
[place] [facility] [address] [place], [address] Phone: [phone]   Fax:  [phone]  Physical Therapy Treatment  Patient Details  Name: ALEISHA PAONE MRN: [id number] Date of Birth: [birth year] Referring Provider:  Arnetha Courser, MD  Encounter Date: 03/15/2015      PT End of Session - 03/15/15 1521    Visit Number 9   Number of Visits 13   Date for PT Re-Evaluation 03/21/15   PT Start Time 8182   PT Stop Time 1603   PT Time Calculation (min) 42 min   Activity Tolerance Patient tolerated treatment well;Patient limited by pain   Behavior During Therapy [facility] for tasks assessed/performed      Past Medical History  Diagnosis Date  . Hypertension     Controlled per pt    No past surgical history on file.  There were no vitals filed for this visit.  Visit Diagnosis:  Sciatica, right  Bilateral low back pain, with sciatica presence unspecified  Piriformis syndrome, right      Subjective Assessment - 03/15/15 1521    Subjective 4/10 R LE current. 8/10 at most for the past 2 days.    Patient Stated Goals Patient states she wants to be better able to bring her R leg up. Also expresses the desire to decrease her pain.    Currently in Pain? Yes   Pain Score 4    Multiple Pain Sites Yes            [facility] PT Assessment - 03/15/15 1549    Observation/Other Assessments   Observations (+) long sit test suggesting posterior nutation of R innominate.                      [place] Adult PT Treatment/Exercise - 03/15/15 1537    Exercises   Other Exercises  Directed patient with log rolling: supine to R S/L 10x, supine to L S/L 10x, R S/L to sit 10x, L S/L to sit 10x. Pt education on back and disc when transitioning from L S/L to sit. Directed patient with prone on elbows with R trunk side bend 2 min, regular prone on elbows for 2 min, prone with R LE off table R hip extension 2x, supine  bridge 2x, supine SLR R hip flexion 10x then 10x2 with 5 second holds. supine mini single leg bridge L LE 10x, supine posterior pelvic tilt 10x2 with 5 second holds. seated L hip extension isometrics 10x5 seconds for 2 sets                PT Education - 03/15/15 2016    Education provided Yes   Education Details ther-ex, HEP   Person(s) Educated Patient   Methods Demonstration;Tactile cues;Explanation;Verbal cues;Handout   Comprehension Returned demonstration             PT Long Term Goals - 03/08/15 0913    PT LONG TERM GOAL #1   Title Patient will be independent with her HEP to help decrease back pain, improve ability to sit, sleep, and laugh.    Time 6   Period Weeks   Status On-going   PT LONG TERM GOAL #2   Title Patient will have a decrease in back pain to 4/10 or less at worst to improve ability to tolerate sitting for longer durations to perform sitting tasks   Time 6   Period Weeks   Status On-going   PT  LONG TERM GOAL #3   Title Patient will improve her Modified Oswestry Low Back Pain Disability Questionnaire by at least 6 points as a demonstration of improved function.    Time 6   Period Weeks   Status On-going   PT LONG TERM GOAL #4   Title Patient will improve bilateral glute max strength by at least 1/2 MMT to decrease overuse of R piriformis muscle and to help decrease R LE pain.    Time 6   Period Weeks   Status On-going               Plan - 03/15/15 1600    Clinical Impression Statement No increase in R LE pain when log rolling or performing supine <> sit both sides. 3.5/10 R LE pain after session.    Pt will benefit from skilled therapeutic intervention in order to improve on the following deficits Decreased range of motion;Difficulty walking;Pain;Hypomobility;Improper body mechanics;Impaired flexibility;Decreased strength   Rehab Potential Good   Clinical Impairments Affecting Rehab Potential pain   PT Frequency 2x / week   PT Duration  6 weeks   PT Treatment/Interventions ADLs/Self Care Home Management;Aquatic Therapy;Traction;Neuromuscular re-education;Gait training;Patient/family education;Passive range of motion;Cryotherapy;Electrical Stimulation;Iontophoresis 4mg /ml Dexamethasone;Therapeutic activities;Manual techniques;Therapeutic exercise;Moist Heat   PT Next Visit Plan proper back posture, decrease piriformis activation, increase glute max muscle use, promote lumbar extension, neural flossing   Consulted and Agree with Plan of Care Patient        Problem List There are no active problems to display for this patient.   Laygo,Miguel 03/15/2015, 8:19 PM  [place] [facility] [address], [place], [address] Phone: [phone]   Fax:  [phone]

## 2015-03-19 ENCOUNTER — Ambulatory Visit: Payer: BC Managed Care – PPO

## 2015-03-19 ENCOUNTER — Telehealth: Payer: Self-pay | Admitting: Family Medicine

## 2015-03-19 MED ORDER — OMEPRAZOLE 20 MG PO CPDR: 20.0000 mg | DELAYED_RELEASE_CAPSULE | Freq: Every day | ORAL | Status: DC | PRN

## 2015-03-19 NOTE — Telephone Encounter (Signed)
Rx: omeprazole (PRILOSEC) 20 MG capsule E-Fax came through for refill, copy in basket.

## 2015-03-21 ENCOUNTER — Ambulatory Visit: Payer: BC Managed Care – PPO

## 2015-03-28 ENCOUNTER — Telehealth: Payer: Self-pay

## 2015-03-28 NOTE — Telephone Encounter (Signed)
She has been being treated for a pulled muscle on her sciatic nerve. She has been going to PT for 1-2 months and it's not helping much and she can't afford to keep paying $70 for every trip to PT. She isn't sleeping due to the pain and wants to know if there is anything you can give her.

## 2015-03-28 NOTE — Telephone Encounter (Signed)
Patient left message to call. 

## 2015-03-28 NOTE — Telephone Encounter (Signed)
Left message for patient to call.

## 2015-03-29 MED ORDER — CYCLOBENZAPRINE HCL 10 MG PO TABS: 10.0000 mg | ORAL_TABLET | Freq: Three times a day (TID) | ORAL | Status: DC | PRN

## 2015-03-29 MED ORDER — GABAPENTIN 300 MG PO CAPS: 300.0000 mg | ORAL_CAPSULE | Freq: Every day | ORAL | Status: DC

## 2015-03-29 MED ORDER — PREDNISONE 10 MG PO TABS: ORAL_TABLET | ORAL | Status: AC

## 2015-03-29 NOTE — Telephone Encounter (Signed)
She had been seen and was referred to PT; went for almost two months; she still can't sleep; worse when sitting; really bad when she is in bed She has tried all the exercises, that works for a short time Therapist thinks more of a disc in back Let's try medicines, and if not helping in 1-2 weeks, come in for appt

## 2015-04-16 ENCOUNTER — Other Ambulatory Visit: Payer: Self-pay | Admitting: Family Medicine

## 2015-05-08 DIAGNOSIS — IMO0002 Reserved for concepts with insufficient information to code with codable children: Secondary | ICD-10-CM | POA: Insufficient documentation

## 2015-05-08 DIAGNOSIS — E785 Hyperlipidemia, unspecified: Secondary | ICD-10-CM | POA: Insufficient documentation

## 2015-05-08 DIAGNOSIS — G43709 Chronic migraine without aura, not intractable, without status migrainosus: Secondary | ICD-10-CM | POA: Insufficient documentation

## 2015-05-08 DIAGNOSIS — J309 Allergic rhinitis, unspecified: Secondary | ICD-10-CM | POA: Insufficient documentation

## 2015-05-08 DIAGNOSIS — G5600 Carpal tunnel syndrome, unspecified upper limb: Secondary | ICD-10-CM | POA: Insufficient documentation

## 2015-05-08 DIAGNOSIS — N926 Irregular menstruation, unspecified: Secondary | ICD-10-CM | POA: Insufficient documentation

## 2015-05-08 DIAGNOSIS — I10 Essential (primary) hypertension: Secondary | ICD-10-CM | POA: Insufficient documentation

## 2015-05-08 DIAGNOSIS — K219 Gastro-esophageal reflux disease without esophagitis: Secondary | ICD-10-CM | POA: Insufficient documentation

## 2015-05-08 DIAGNOSIS — R7301 Impaired fasting glucose: Secondary | ICD-10-CM | POA: Insufficient documentation

## 2015-05-11 ENCOUNTER — Ambulatory Visit (INDEPENDENT_AMBULATORY_CARE_PROVIDER_SITE_OTHER): Payer: BC Managed Care – PPO | Admitting: Family Medicine

## 2015-05-11 ENCOUNTER — Encounter: Payer: Self-pay | Admitting: Family Medicine

## 2015-05-11 VITALS — BP 125/84 | HR 86 | Temp 97.5°F | Wt 163.0 lb

## 2015-05-11 DIAGNOSIS — E663 Overweight: Secondary | ICD-10-CM

## 2015-05-11 DIAGNOSIS — I10 Essential (primary) hypertension: Secondary | ICD-10-CM

## 2015-05-11 DIAGNOSIS — J069 Acute upper respiratory infection, unspecified: Secondary | ICD-10-CM | POA: Diagnosis not present

## 2015-05-11 DIAGNOSIS — K219 Gastro-esophageal reflux disease without esophagitis: Secondary | ICD-10-CM

## 2015-05-11 DIAGNOSIS — M5416 Radiculopathy, lumbar region: Secondary | ICD-10-CM | POA: Diagnosis not present

## 2015-05-11 DIAGNOSIS — L309 Dermatitis, unspecified: Secondary | ICD-10-CM

## 2015-05-11 MED ORDER — METHOCARBAMOL 500 MG PO TABS: 500.0000 mg | ORAL_TABLET | Freq: Four times a day (QID) | ORAL | Status: DC | PRN

## 2015-05-11 MED ORDER — PREDNISONE 10 MG PO TABS: ORAL_TABLET | ORAL | Status: DC

## 2015-05-11 MED ORDER — TRIAMCINOLONE ACETONIDE 0.1 % EX CREA: 1.0000 "application " | TOPICAL_CREAM | Freq: Two times a day (BID) | CUTANEOUS | Status: DC | PRN

## 2015-05-11 MED ORDER — HYDROXYZINE HCL 25 MG PO TABS: 25.0000 mg | ORAL_TABLET | Freq: Every evening | ORAL | Status: DC | PRN

## 2015-05-11 NOTE — Assessment & Plan Note (Signed)
Praised patient for weight loss efforts, healthier eating

## 2015-05-11 NOTE — Progress Notes (Signed)
BP 125/84 mmHg  Pulse 86  Temp(Src) 97.5 F (36.4 C)  Wt 163 lb (73.936 kg)  SpO2 100%   Subjective:    Patient ID: Carrie Cross, female    DOB: [birth year], 39 y.o.   MRN: [id number]  HPI: Carrie Cross is a 39 y.o. female  Chief Complaint  Patient presents with  . Cough    Husband has pneumonia and now she has a cough  . Itching    bilateral arms  . Leg Pain    right leg   Her cough started a week ago; she coughs during the day, but more at night; wakes her up; no fevers; cough is nonproductive; having sore throat; just irritated; ears are popping, bothering her some, feels like something in them, like in a tunnel; some postnasal drip; not blowing out much through nose; no body aches  Having rash on the arms and legs; itches; daughter has eczema; eczema aveeno doesn't work; worse with hot weather; worse with hot water; going on for a month or so; started really small on the arms; taking benadryl and it still wakes her up; daughter affected by weather especially; no places on her trunk  She is having issues with her right side and hurts; she has been to physical therapy; they think it's her back; she kept going to him and it wasn't working; the only thing that gave her relief was the prednisone; during the day, she was okay, but at night, it is terrible; she can't cough because it hurts; the whole right hip is heavy and hurts to move; it will sometimes go all the way down to the foot; other times to just below the knee; no back pain; the PT is thinking something is up with her back; no muscle relaxers now; when she has tried them in the past, she can go back to sleep better; last round of prednisone was a month or two ago; patient is miserable; nothing is fixing it  She is losing weight on purpose; eating better; not really exercising, but clean eating, watching carbs and sugars; she does not have a target weight  Relevant past medical, surgical, family and social  history reviewed and updated as indicated. Interim medical history since our last visit reviewed. Allergies and medications reviewed and updated.  Review of Systems Per HPI unless specifically indicated above     Objective:    BP 125/84 mmHg  Pulse 86  Temp(Src) 97.5 F (36.4 C)  Wt 163 lb (73.936 kg)  SpO2 100%  Wt Readings from Last 3 Encounters:  05/11/15 163 lb (73.936 kg)  01/11/15 173 lb (78.472 kg)    Physical Exam  Constitutional: She appears well-developed and well-nourished. No distress.  HENT:  Head: Normocephalic and atraumatic.  Right Ear: Hearing, tympanic membrane, external ear and ear canal normal. Tympanic membrane is not injected and not erythematous. No middle ear effusion.  Left Ear: Hearing, tympanic membrane, external ear and ear canal normal. Tympanic membrane is not injected and not erythematous.  No middle ear effusion.  Nose: Rhinorrhea (scant clear rhinorrhea) present.  Mouth/Throat: Mucous membranes are normal. Mucous membranes are not pale. No oral lesions. Posterior oropharyngeal erythema (very mild injection posteriorly) present. No oropharyngeal exudate or posterior oropharyngeal edema.  Eyes: EOM are normal. No scleral icterus.  Neck: No thyromegaly present.  Cardiovascular: Normal rate, regular rhythm and normal heart sounds.   No murmur heard. Pulmonary/Chest: Effort normal and breath sounds normal. No tachypnea. No respiratory  distress. She has no decreased breath sounds. She has no wheezes. She has no rhonchi. She has no rales.  Abdominal: Soft. Bowel sounds are normal. She exhibits no distension.  Musculoskeletal: She exhibits no edema.       Right hip: She exhibits no tenderness and no bony tenderness.       Lumbar back: She exhibits decreased range of motion (forward flexion at the waist to about 75 degrees; normal extension) and tenderness (mild tenderness right of midline; no SI joint tenderness). She exhibits no edema.  Lymphadenopathy:     She has no cervical adenopathy.  Neurological: She is alert. She exhibits normal muscle tone. Gait normal.  Reflex Scores:      Patellar reflexes are 1+ on the right side and 1+ on the left side. Lower extremity strength 5/5; negative SLR (seated)  Skin: Skin is warm and dry. Rash (nummular lesions on the arms, no scale; mild erythema; slightly lichenified) noted. She is not diaphoretic. No pallor.  Psychiatric: She has a normal mood and affect. Her behavior is normal. Judgment and thought content normal.    Results for orders placed or performed in visit on 05/08/15  HM MAMMOGRAPHY  Result Value Ref Range   HM Mammogram per PP   HM PAP SMEAR  Result Value Ref Range   HM Pap smear per PP       Assessment & Plan:   Problem List Items Addressed This Visit      Cardiovascular and Mediastinum   Hypertension    Controlled today; as she continues to lose weight, we'll likely be able to decrease medicines        Digestive   GERD (gastroesophageal reflux disease)    Continue PPI; take prednisone with food; if any stomach pain or dark stools, seek medical help immeidately; avoid NSAIDs        Nervous and Auditory   Subacute right lumbar radiculopathy    Patient has seen physical therapist; pain persists; not really helped much by muscle relaxants; I would like her to see orthopaedist for evaluation; not sure if etiology coming from spinal pathology but I'm reluctant to just order lumbar spine MRI ($$$) and will have her see ortho first; she is "miserable" so we'll do a prednisone taper (since this will also help her eczema), but will only to a 5 day taper, lower than last, discussed risk of GI problems such as ulcer and bleed, diabetes, etc.       Relevant Medications   methocarbamol (ROBAXIN) 500 MG tablet   hydrOXYzine (ATARAX/VISTARIL) 25 MG tablet   Other Relevant Orders   Ambulatory referral to Orthopedic Surgery     Musculoskeletal and Integument   Eczema    5 day  taper of prednisone; cautions given; avoid all products with fragrance; use topical steorid cream; continue allegra and use Rx hydroxyzine at bedtime; avoid soap when possible and pat dry after showers        Other   Overweight (BMI 25.0-29.9)    Praised patient for weight loss efforts, healthier eating       Other Visit Diagnoses    Upper respiratory infection    -  Primary    rest, hydration, green tea, vit C; no need for antibiotics at this time; call if worsening; Mucinex DM may help; avoid the "D" which could raise blood pressure       Follow up plan: Return if symptoms worsen or fail to improve.  An after-visit summary was printed  and given to the patient at [place].  Please see the patient instructions which may contain other information and recommendations beyond what is mentioned above in the assessment and plan.  Meds ordered this encounter  Medications  . methocarbamol (ROBAXIN) 500 MG tablet    Sig: Take 1 tablet (500 mg total) by mouth every 6 (six) hours as needed for muscle spasms.    Dispense:  15 tablet    Refill:  0  . predniSONE (DELTASONE) 10 MG tablet    Sig: Five pills po today, then 4 on Sat, 3 on Sun, 2 on Mon, and 1 on Tuesday; take with food    Dispense:  15 tablet    Refill:  0  . triamcinolone cream (KENALOG) 0.1 %    Sig: Apply 1 application topically 2 (two) times daily as needed. Use just on affected skin; not on face    Dispense:  80 g    Refill:  1  . hydrOXYzine (ATARAX/VISTARIL) 25 MG tablet    Sig: Take 1 tablet (25 mg total) by mouth at bedtime as needed for itching.    Dispense:  20 tablet    Refill:  0   Orders Placed This Encounter  Procedures  . Ambulatory referral to Orthopedic Surgery

## 2015-05-11 NOTE — Assessment & Plan Note (Signed)
Continue PPI; take prednisone with food; if any stomach pain or dark stools, seek medical help immeidately; avoid NSAIDs

## 2015-05-11 NOTE — Assessment & Plan Note (Signed)
Controlled today; as she continues to lose weight, we'll likely be able to decrease medicines

## 2015-05-11 NOTE — Assessment & Plan Note (Signed)
5 day taper of prednisone; cautions given; avoid all products with fragrance; use topical steorid cream; continue allegra and use Rx hydroxyzine at bedtime; avoid soap when possible and pat dry after showers

## 2015-05-11 NOTE — Assessment & Plan Note (Signed)
Patient has seen physical therapist; pain persists; not really helped much by muscle relaxants; I would like her to see orthopaedist for evaluation; not sure if etiology coming from spinal pathology but I'm reluctant to just order lumbar spine MRI ($$$) and will have her see ortho first; she is "miserable" so we'll do a prednisone taper (since this will also help her eczema), but will only to a 5 day taper, lower than last, discussed risk of GI problems such as ulcer and bleed, diabetes, etc.

## 2015-05-11 NOTE — Patient Instructions (Addendum)
Try vitamin C (orange juice if not diabetic or vitamin C tablets) and drink green tea to help your immune system during your illness Get plenty of rest and hydration Pick up some Mucinex DM (not the D, but make sure it is DM) for cough if needed Avoid all products with fragrance Use the steroid cream just on the affected skin Start the prednisone We'll refer you to the orthopaedist Keep up the great job with your weight loss efforts

## 2015-05-24 ENCOUNTER — Other Ambulatory Visit: Payer: Self-pay

## 2015-05-24 MED ORDER — OMEPRAZOLE 20 MG PO CPDR: 20.0000 mg | DELAYED_RELEASE_CAPSULE | Freq: Every day | ORAL | Status: DC | PRN

## 2015-06-06 ENCOUNTER — Telehealth: Payer: Self-pay

## 2015-06-06 NOTE — Telephone Encounter (Signed)
yes

## 2015-06-06 NOTE — Telephone Encounter (Signed)
Patient called and stated that her Omeprazole requires prior auth, can you look into it? Thanks!

## 2015-06-14 NOTE — Telephone Encounter (Signed)
Has this been taken care of?

## 2015-07-23 ENCOUNTER — Other Ambulatory Visit: Payer: Self-pay | Admitting: Family Medicine

## 2015-07-23 DIAGNOSIS — E785 Hyperlipidemia, unspecified: Secondary | ICD-10-CM

## 2015-07-23 DIAGNOSIS — R7301 Impaired fasting glucose: Secondary | ICD-10-CM

## 2015-07-23 DIAGNOSIS — Z5181 Encounter for therapeutic drug level monitoring: Secondary | ICD-10-CM

## 2015-07-23 NOTE — Telephone Encounter (Signed)
Your patient.  Thanks 

## 2015-07-23 NOTE — Assessment & Plan Note (Signed)
Check lipids and sgpt 

## 2015-07-23 NOTE — Assessment & Plan Note (Signed)
Check A1C and fasting glucose 

## 2015-07-23 NOTE — Assessment & Plan Note (Signed)
Check liver, kidney function

## 2015-07-23 NOTE — Telephone Encounter (Signed)
Please let patient know that it's time to check fasting lipids, liver enzymes, and kidney function She might want to come in before Thanksgiving and the holidays, as many people tend to eat a little out of the ordinary during the holiday season Orders entered Rx filled Thanks

## 2015-07-24 NOTE — Telephone Encounter (Signed)
Patient notified, lab appt. Scheduled.

## 2015-07-25 ENCOUNTER — Other Ambulatory Visit: Payer: BC Managed Care – PPO

## 2015-07-25 DIAGNOSIS — R7301 Impaired fasting glucose: Secondary | ICD-10-CM

## 2015-07-25 DIAGNOSIS — E785 Hyperlipidemia, unspecified: Secondary | ICD-10-CM

## 2015-07-25 DIAGNOSIS — Z5181 Encounter for therapeutic drug level monitoring: Secondary | ICD-10-CM

## 2015-07-26 LAB — COMPREHENSIVE METABOLIC PANEL
A/G RATIO: 1.8 (ref 1.1–2.5)
ALBUMIN: 4.1 g/dL (ref 3.5–5.5)
ALK PHOS: 87 IU/L (ref 39–117)
ALT: 20 IU/L (ref 0–32)
AST: 20 IU/L (ref 0–40)
BILIRUBIN TOTAL: 0.3 mg/dL (ref 0.0–1.2)
BUN / CREAT RATIO: 19 (ref 8–20)
BUN: 13 mg/dL (ref 6–20)
CHLORIDE: 102 mmol/L (ref 97–106)
CO2: 24 mmol/L (ref 18–29)
Calcium: 9 mg/dL (ref 8.7–10.2)
Creatinine, Ser: 0.67 mg/dL (ref 0.57–1.00)
GFR calc Af Amer: 129 mL/min/{1.73_m2} (ref >59)
GFR calc non Af Amer: 112 mL/min/{1.73_m2} (ref >59)
GLOBULIN, TOTAL: 2.3 g/dL (ref 1.5–4.5)
GLUCOSE: 97 mg/dL (ref 65–99)
Potassium: 3.7 mmol/L (ref 3.5–5.2)
SODIUM: 140 mmol/L (ref 136–144)
Total Protein: 6.4 g/dL (ref 6.0–8.5)

## 2015-07-26 LAB — LIPID PANEL W/O CHOL/HDL RATIO
Cholesterol, Total: 151 mg/dL (ref 100–199)
HDL: 53 mg/dL (ref >39)
LDL Calculated: 89 mg/dL (ref 0–99)
Triglycerides: 47 mg/dL (ref 0–149)
VLDL CHOLESTEROL CAL: 9 mg/dL (ref 5–40)

## 2015-07-26 LAB — HGB A1C W/O EAG: Hgb A1c MFr Bld: 6.7 % — ABNORMAL HIGH (ref 4.8–5.6)

## 2015-07-30 ENCOUNTER — Telehealth: Payer: Self-pay

## 2015-07-30 DIAGNOSIS — R928 Other abnormal and inconclusive findings on diagnostic imaging of breast: Secondary | ICD-10-CM

## 2015-07-30 NOTE — Telephone Encounter (Signed)
Patient would like Dr.Lada to call.

## 2015-07-30 NOTE — Telephone Encounter (Signed)
Patient would like to talk to Dr. Sanda Klein in regards to something. She would not tell me. I advised her that Dr. lada was out of the office today and she was ok with waiting.  She is also due for her yearly mammogram and 6 month f/u diagnostic left breast mammo. Needs to be after 08/24/15. Needs orders placed.

## 2015-07-31 ENCOUNTER — Other Ambulatory Visit: Payer: Self-pay | Admitting: Family Medicine

## 2015-07-31 DIAGNOSIS — R928 Other abnormal and inconclusive findings on diagnostic imaging of breast: Secondary | ICD-10-CM | POA: Insufficient documentation

## 2015-07-31 NOTE — Telephone Encounter (Signed)
CMP from last week reviewed; Rx approved

## 2015-07-31 NOTE — Telephone Encounter (Signed)
rx

## 2015-07-31 NOTE — Telephone Encounter (Signed)
Your patient.  Thanks 

## 2015-07-31 NOTE — Telephone Encounter (Signed)
I spoke with patient; husband has hx of hsv; she has had something going on for a week down below; I don't have any openings today but can see her tomorrow at 8:00 am (double book) I also explained we need to address her blood sugar, as it has gone up

## 2015-07-31 NOTE — Assessment & Plan Note (Signed)
Due for 6 month f/u at time of yearly mammogram on or after Aug 24, 2015

## 2015-08-01 ENCOUNTER — Ambulatory Visit (INDEPENDENT_AMBULATORY_CARE_PROVIDER_SITE_OTHER): Payer: BC Managed Care – PPO | Admitting: Family Medicine

## 2015-08-01 ENCOUNTER — Ambulatory Visit: Payer: BC Managed Care – PPO | Admitting: Family Medicine

## 2015-08-01 ENCOUNTER — Encounter: Payer: Self-pay | Admitting: Family Medicine

## 2015-08-01 VITALS — BP 127/84 | HR 74 | Temp 97.1°F | Wt 166.0 lb

## 2015-08-01 DIAGNOSIS — R739 Hyperglycemia, unspecified: Secondary | ICD-10-CM

## 2015-08-01 DIAGNOSIS — R7301 Impaired fasting glucose: Secondary | ICD-10-CM

## 2015-08-01 DIAGNOSIS — N898 Other specified noninflammatory disorders of vagina: Secondary | ICD-10-CM | POA: Insufficient documentation

## 2015-08-01 DIAGNOSIS — R198 Other specified symptoms and signs involving the digestive system and abdomen: Secondary | ICD-10-CM | POA: Diagnosis not present

## 2015-08-01 DIAGNOSIS — R3989 Other symptoms and signs involving the genitourinary system: Secondary | ICD-10-CM | POA: Insufficient documentation

## 2015-08-01 DIAGNOSIS — Z23 Encounter for immunization: Secondary | ICD-10-CM

## 2015-08-01 LAB — WET PREP FOR TRICH, YEAST, CLUE
CLUE CELL EXAM: POSITIVE — AB
Trichomonas Exam: NEGATIVE
Yeast Exam: NEGATIVE

## 2015-08-01 LAB — BAYER DCA HB A1C WAIVED: HB A1C (BAYER DCA - WAIVED): 6.2 % (ref <7.0)

## 2015-08-01 MED ORDER — ACYCLOVIR 5 % EX CREA: 1.0000 "application " | TOPICAL_CREAM | CUTANEOUS | Status: DC

## 2015-08-01 MED ORDER — METRONIDAZOLE 500 MG PO TABS: 500.0000 mg | ORAL_TABLET | Freq: Two times a day (BID) | ORAL | Status: DC

## 2015-08-01 NOTE — Assessment & Plan Note (Signed)
Recheck A1C was 6.2; explained she is very close to diabetes; work on weight loss, avoiding sweets as much as possible; we'll want to follow this every 6 months

## 2015-08-01 NOTE — Assessment & Plan Note (Signed)
Suspicious for HSV; viral culture collected; treat with acyclovir topically since it has been a week; take care with shaving

## 2015-08-01 NOTE — Progress Notes (Signed)
BP 127/84 mmHg  Pulse 74  Temp(Src) 97.1 F (36.2 C)  Wt 166 lb (75.297 kg)  SpO2 100%   Subjective:    Patient ID: Carrie Cross, female    DOB: [birth year], 39 y.o.   MRN: [id number]  HPI: Carrie Cross is a 39 y.o. female  Chief Complaint  Patient presents with  . Hyperglycemia    discuss labs  . Vaginal Issues    possible HSV outbreak   She was found to;have an A1C of 6.7 on last labs but the glucose was normal; I asked her to come back in for recheck  She noticed a sore in her genital region a week ago; shaves down below; husband has HSV; painful; also has a little discharge, gets BV from time to time; husband wears condoms most of the time; patient did have labs done before that showed she had been exposed to HSV; no fevers  Relevant past medical reviewed and updated as indicated Allergies and medications reviewed and updated.  Review of Systems  Per HPI unless specifically indicated above     Objective:    BP 127/84 mmHg  Pulse 74  Temp(Src) 97.1 F (36.2 C)  Wt 166 lb (75.297 kg)  SpO2 100%  Wt Readings from Last 3 Encounters:  08/01/15 166 lb (75.297 kg)  05/11/15 163 lb (73.936 kg)  01/11/15 173 lb (78.472 kg)    Physical Exam  Constitutional: She appears well-developed and well-nourished.  Pulmonary/Chest: Effort normal.  Genitourinary: There is lesion (shallow ulcerated lesion about 3x4 mm, smaller next to it 1x2 mm) on the left labia. Vaginal discharge (thin whitish discharge at introitus) found.  Lymphadenopathy:       Right: No inguinal adenopathy present.       Left: No inguinal adenopathy present.  Psychiatric: She has a normal mood and affect.    Results for orders placed or performed in visit on 07/25/15  Lipid Panel w/o Chol/HDL Ratio  Result Value Ref Range   Cholesterol, Total 151 100 - 199 mg/dL   Triglycerides 47 0 - 149 mg/dL   HDL 53 >39 mg/dL   VLDL Cholesterol Cal 9 5 - 40 mg/dL   LDL Calculated 89 0 - 99 mg/dL   Hgb A1c w/o eAG  Result Value Ref Range   Hgb A1c MFr Bld 6.7 (H) 4.8 - 5.6 %  Comprehensive metabolic panel  Result Value Ref Range   Glucose 97 65 - 99 mg/dL   BUN 13 6 - 20 mg/dL   Creatinine, Ser 0.67 0.57 - 1.00 mg/dL   GFR calc non Af Amer 112 >59 mL/min/1.73   GFR calc Af Amer 129 >59 mL/min/1.73   BUN/Creatinine Ratio 19 8 - 20   Sodium 140 136 - 144 mmol/L   Potassium 3.7 3.5 - 5.2 mmol/L   Chloride 102 97 - 106 mmol/L   CO2 24 18 - 29 mmol/L   Calcium 9.0 8.7 - 10.2 mg/dL   Total Protein 6.4 6.0 - 8.5 g/dL   Albumin 4.1 3.5 - 5.5 g/dL   Globulin, Total 2.3 1.5 - 4.5 g/dL   Albumin/Globulin Ratio 1.8 1.1 - 2.5   Bilirubin Total 0.3 0.0 - 1.2 mg/dL   Alkaline Phosphatase 87 39 - 117 IU/L   AST 20 0 - 40 IU/L   ALT 20 0 - 32 IU/L      Assessment & Plan:   Problem List Items Addressed This Visit      Endocrine  IFG (impaired fasting glucose)    Recheck A1C was 6.2; explained she is very close to diabetes; work on weight loss, avoiding sweets as much as possible; we'll want to follow this every 6 months        Genitourinary   Genital sore - Primary    Suspicious for HSV; viral culture collected; treat with acyclovir topically since it has been a week; take care with shaving      Relevant Orders   Herpes simplex virus culture     Other   Vaginal discharge    Suspect BV; discussed role of stress; wet mount collected; Rx given for metronidazole to fill if test comes back positive; we'll call her      Relevant Orders   WET PREP FOR [place]    Other Visit Diagnoses    Hyperglycemia        order used for A1C    Relevant Orders    Bayer DCA Hb A1c Waived (STAT)    Needs flu shot        flu vaccine given today    Encounter for immunization            Follow up plan: Return in about 6 months (around 01/29/2016) for 30 minute visit, fasting, prediabetes.  Meds ordered this encounter  Medications  . acyclovir cream (ZOVIRAX) 5 %    Sig:  Apply 1 application topically every 4 (four) hours. While awake for five days    Dispense:  5 g    Refill:  5  . metroNIDAZOLE (FLAGYL) 500 MG tablet    Sig: Take 1 tablet (500 mg total) by mouth 2 (two) times daily. No alcohol or cold medicines that contain alcohol while on this med    Dispense:  14 tablet    Refill:  0   Orders Placed This Encounter  Procedures  . Herpes simplex virus culture  . WET PREP FOR [place], YEAST, CLUE  . Flu Vaccine QUAD 36+ mos IM  . Bayer DCA Hb A1c Waived (STAT)

## 2015-08-01 NOTE — Assessment & Plan Note (Signed)
Suspect BV; discussed role of stress; wet mount collected; Rx given for metronidazole to fill if test comes back positive; we'll call her

## 2015-08-01 NOTE — Patient Instructions (Addendum)
Use the new cream every four hours while awake for five days Fill the new pills ONLY if we call and tell you to take them (those would be for BV) Call with any problems You received the flu shot today; it should protect you against the flu virus over the coming months; it will take about two weeks for antibodies to develop; do try to stay away from hospitals, nursing homes, and daycares during peak flu season; taking extra vitamin C daily during flu season may help you avoid getting sick Really limit sweets and try to lose weight

## 2015-08-04 LAB — HERPES SIMPLEX VIRUS CULTURE

## 2015-08-10 ENCOUNTER — Encounter: Payer: Self-pay | Admitting: Family Medicine

## 2015-08-10 ENCOUNTER — Telehealth: Payer: Self-pay | Admitting: Family Medicine

## 2015-08-10 DIAGNOSIS — B009 Herpesviral infection, unspecified: Secondary | ICD-10-CM

## 2015-08-10 NOTE — Telephone Encounter (Signed)
Calling with lab results; left brief msg; will call Monday

## 2015-08-15 NOTE — Telephone Encounter (Signed)
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

## 2015-08-22 ENCOUNTER — Telehealth: Payer: Self-pay | Admitting: Family Medicine

## 2015-08-22 NOTE — Telephone Encounter (Signed)
Pt would like to know if her order had been put in for her mammo at [facility].

## 2015-08-23 NOTE — Telephone Encounter (Signed)
Patient notified order is in, she will call [facility] to schedule her mammogram.

## 2015-08-24 ENCOUNTER — Other Ambulatory Visit: Payer: Self-pay

## 2015-08-24 ENCOUNTER — Other Ambulatory Visit: Payer: Self-pay | Admitting: Family Medicine

## 2015-08-24 ENCOUNTER — Telehealth: Payer: Self-pay | Admitting: Family Medicine

## 2015-08-24 DIAGNOSIS — R928 Other abnormal and inconclusive findings on diagnostic imaging of breast: Secondary | ICD-10-CM

## 2015-08-24 NOTE — Telephone Encounter (Signed)
Jul 25, 2015 lipids and sgpt reviewed; rx approved

## 2015-08-24 NOTE — Telephone Encounter (Signed)
Yes, signed; thank you

## 2015-08-24 NOTE — Assessment & Plan Note (Signed)
[place] ordered per [facility] request

## 2015-08-24 NOTE — Telephone Encounter (Signed)
Pt called stated Dr. Sanda Klein has done an order for a mammogram, when pt called to schedule an appt the clinic told her the order needed to include an ultrasound so they can schedule the appt. Pt stated clinic stated everything needed would be in the notes. Please call pt or [facility] with any questions. Thanks.

## 2015-08-24 NOTE — Telephone Encounter (Signed)
Ultrasound added for left breast.   Dr. Sanda Klein, will you sign off on this order?

## 2015-08-26 ENCOUNTER — Encounter: Payer: Self-pay | Admitting: Family Medicine

## 2015-08-28 MED ORDER — VALACYCLOVIR HCL 500 MG PO TABS: 500.0000 mg | ORAL_TABLET | Freq: Two times a day (BID) | ORAL | Status: DC

## 2015-08-28 NOTE — Telephone Encounter (Signed)
Routing to provider  

## 2015-08-28 NOTE — Telephone Encounter (Signed)
Rx approved, sent to [facility]

## 2015-09-07 ENCOUNTER — Telehealth: Payer: Self-pay | Admitting: Family Medicine

## 2015-09-07 DIAGNOSIS — R928 Other abnormal and inconclusive findings on diagnostic imaging of breast: Secondary | ICD-10-CM

## 2015-09-07 NOTE — Telephone Encounter (Signed)
Patient called stating that [facility] told her that the orders Dr. Sanda Klein put in are not complete. Please call patient for further information. [phone]

## 2015-09-13 ENCOUNTER — Other Ambulatory Visit: Payer: Self-pay | Admitting: Specialist

## 2015-09-13 DIAGNOSIS — M5431 Sciatica, right side: Secondary | ICD-10-CM

## 2015-09-21 NOTE — Telephone Encounter (Signed)
Spoke with patient, order is still not correct. Called and left a voicemail at [facility] to give me a call so that we can get this figured out.

## 2015-09-26 ENCOUNTER — Encounter: Payer: Self-pay | Admitting: Family Medicine

## 2015-09-26 NOTE — Telephone Encounter (Signed)
Keri,  If you will find out what is going on, I will order whatever they want We want to get this done Please verify what radiology is asking for and we'll put in orders Then call patient and let her know progress

## 2015-10-04 ENCOUNTER — Ambulatory Visit
Admission: RE | Admit: 2015-10-04 | Discharge: 2015-10-04 | Disposition: A | Discharge status: Home / Self Care | Payer: BC Managed Care – PPO | Source: Ambulatory Visit | Attending: Specialist | Admitting: Specialist

## 2015-10-04 DIAGNOSIS — M5431 Sciatica, right side: Secondary | ICD-10-CM | POA: Diagnosis present

## 2015-10-04 DIAGNOSIS — M4806 Spinal stenosis, lumbar region: Secondary | ICD-10-CM | POA: Diagnosis not present

## 2015-10-04 DIAGNOSIS — M5127 Other intervertebral disc displacement, lumbosacral region: Secondary | ICD-10-CM | POA: Insufficient documentation

## 2015-10-04 DIAGNOSIS — M545 Low back pain: Secondary | ICD-10-CM | POA: Diagnosis present

## 2015-10-08 ENCOUNTER — Encounter: Payer: Self-pay | Admitting: Family Medicine

## 2015-10-08 NOTE — Telephone Encounter (Signed)
Alwyn Ren, Can you please assist the patient with this? I put this order in back in November; it should have been done already I sent a MyChart note to her, but would love to make sure her studies get done and patient's needs are addressed Thank you

## 2015-10-09 NOTE — Telephone Encounter (Signed)
Amy, I sent a message to [place] yesterday about this If someone will please contact radiology; WHATEVER they need to follow-up on the imaging tests ordered months ago, I will order I need to have her get her breasts imaged I do not understand the hold-up They will sometimes put in additional orders of what they want and have me co-sign them I just want her to get her studies done and I will sign whatever is needed

## 2015-10-09 NOTE — Telephone Encounter (Signed)
Fantastic; thank you, Amy I can't figure out how to cancel that old one either; it's not under open orders

## 2015-10-09 NOTE — Telephone Encounter (Signed)
I spoke back with [facility], they said that it must be a bilat diagnostic mammo (which was already ordered) but also an u/s of the right breast limited and left breast limited. I corrected the orders. I also sent the patient a mychart message stating that I have spoke back with [facility] and verified that the correct orders are in place.  The only item that needs to be cancelled is the 'MM DIAG BREAST TOMO UNI LEFT'. I could not figure out how to cancel it.

## 2015-10-17 ENCOUNTER — Other Ambulatory Visit: Payer: Self-pay | Admitting: Family Medicine

## 2015-10-17 MED ORDER — OSELTAMIVIR PHOSPHATE 75 MG PO CAPS: 75.0000 mg | ORAL_CAPSULE | Freq: Every day | ORAL | Status: DC

## 2015-10-25 ENCOUNTER — Ambulatory Visit
Admission: RE | Admit: 2015-10-25 | Discharge: 2015-10-25 | Disposition: A | Discharge status: Home / Self Care | Payer: BC Managed Care – PPO | Source: Ambulatory Visit | Attending: Family Medicine | Admitting: Family Medicine

## 2015-10-25 ENCOUNTER — Other Ambulatory Visit: Payer: Self-pay | Admitting: Family Medicine

## 2015-10-25 DIAGNOSIS — R928 Other abnormal and inconclusive findings on diagnostic imaging of breast: Secondary | ICD-10-CM | POA: Diagnosis present

## 2015-10-25 DIAGNOSIS — N6489 Other specified disorders of breast: Secondary | ICD-10-CM | POA: Insufficient documentation

## 2015-10-27 ENCOUNTER — Other Ambulatory Visit: Payer: Self-pay | Admitting: Family Medicine

## 2015-10-27 NOTE — Telephone Encounter (Signed)
rx approved

## 2015-11-07 HISTORY — PX: BACK SURGERY: SHX140

## 2015-11-26 ENCOUNTER — Other Ambulatory Visit: Payer: Self-pay | Admitting: Family Medicine

## 2015-11-26 NOTE — Telephone Encounter (Signed)
Refills approved.

## 2015-12-03 ENCOUNTER — Encounter: Payer: Self-pay | Admitting: Family Medicine

## 2015-12-13 ENCOUNTER — Encounter: Payer: Self-pay | Admitting: Physical Therapy

## 2015-12-13 ENCOUNTER — Ambulatory Visit: Payer: BC Managed Care – PPO | Attending: Neurosurgery | Admitting: Physical Therapy

## 2015-12-13 DIAGNOSIS — R262 Difficulty in walking, not elsewhere classified: Secondary | ICD-10-CM | POA: Insufficient documentation

## 2015-12-13 DIAGNOSIS — M5431 Sciatica, right side: Secondary | ICD-10-CM | POA: Insufficient documentation

## 2015-12-13 DIAGNOSIS — M545 Low back pain, unspecified: Secondary | ICD-10-CM

## 2015-12-13 DIAGNOSIS — M6281 Muscle weakness (generalized): Secondary | ICD-10-CM | POA: Insufficient documentation

## 2015-12-13 NOTE — Therapy (Signed)
[place] MAIN [facility] SERVICES [address] [place], [place], [address] Phone: [phone]   Fax:  [phone]  Physical Therapy Evaluation  Patient Details  Name: Carrie Cross MRN: [id number] Date of Birth: 1985/01/12 Referring Provider: Kary Kos MD  Encounter Date: 12/13/2015      PT End of Session - 12/13/15 1614    Visit Number 1   Number of Visits 13   Date for PT Re-Evaluation 01/24/16   PT Start Time [id number]   PT Stop Time 1610   PT Time Calculation (min) 55 min   Activity Tolerance Patient tolerated treatment well;No increased pain   Behavior During Therapy [facility] for tasks assessed/performed      Past Medical History  Diagnosis Date  . Hypertension     Controlled per pt  . Hyperlipidemia   . GERD (gastroesophageal reflux disease)   . Allergic rhinitis   . Irregular menstruation   . Carpal tunnel syndrome   . IFG (impaired fasting glucose)   . Chronic migraine   . Hx of pyelonephritis     as a child    History reviewed. No pertinent past surgical history.  There were no vitals filed for this visit.       Subjective Assessment - 12/13/15 1523    Subjective 40 yo Female reports having increased back pain with RLE radiculpathy starting last summer 2016; Patient had PT treatment for 4 weeks in June without relief; She is s/p discectomy on 11/07/15; She reports since surgery she is not having any RLE pain and no heaviness; She reports feeling increased stiffness in spine and feeling just achy in the low back with prolonged activity; She reports not being back to her PLOF.    Pertinent History personal factors affecting rehab: chronic back pain, fear of re-injury   How long can you sit comfortably? has to re-adjust but doesn't feel any RLE pain;    How long can you stand comfortably? longest stood is 5-10 min with no pain; but hasn't tried standing long time;    How long can you walk comfortably? limited to <20 min due to  increased back pain;    Diagnostic tests MRI showed L5-S1 severe disc bulge prior to surgery; no recent images;    Patient Stated Goals get back to PLOF; get back to walking as before; being able to bend down towards floor, squatting;    Currently in Pain? Yes   Pain Score 1    Pain Location Back   Pain Orientation Lower   Pain Descriptors / Indicators Aching;Dull   Pain Type Surgical pain   Pain Radiating Towards just in low back;    Pain Onset 1 to 4 weeks ago   Aggravating Factors  prolonged walking (more than 20 min at a time);    Pain Relieving Factors pain medication, rest,    Effect of Pain on Daily Activities decreased            [facility] PT Assessment - 12/13/15 0001    Assessment   Medical Diagnosis s/p lumbar disc displacement   Referring Provider Kary Kos MD   Onset Date/Surgical Date 11/07/15   Hand Dominance Right   Next MD Visit Jan 10, 2016   Prior Therapy Had PT for 4 weeks in June 2016 with minimal relief;    Precautions   Precautions Back   Precaution Comments patient verbalized understanding of precautions and restriction; not wearing any back brace;    Restrictions  Other Position/Activity Restrictions no lifting >8 pounds; no twisting/bending   Balance Screen   Has the patient fallen in the past 6 months No   Has the patient had a decrease in activity level because of a fear of falling?  No   Is the patient reluctant to leave their home because of a fear of falling?  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; push carts; no specific job requirements;    Leisure travel, shopping sometimes; hang out with family;    Cognition    Overall Cognitive Status Within Functional Limits for tasks assessed   Observation/Other Assessments   Modified [place] 36% (moderate disability)   Lower Extremity Functional Scale  39/80 (the lower the score the greater the disability)   Posture/Postural Control   Posture Comments sits with erect posture, increased lumbar lordosis;    AROM   Lumbar Flexion 40   Lumbar Extension 25   Lumbar - Right Side Bend 25   Lumbar - Left Side Bend 35   Strength   Overall Strength Comments BLE strength is WFL with exception: R: hip flexion 4-/5, hip extension 3+/5, hip abduction 4-/5, knee flexion 3+/5; weakness also noticed in right multifidi with less muscle activation during hip extension as compared to left (decreased tone)   Palpation   SI assessment  has increased discomfort with right lower back pain with PA mobs along L3, L4, L5, S1; hypomobility noted at those spots; Also right transverse processes more prominant than left with hypomobility and increased pain with palpation;    Palpation comment moderate tenderness along right lumbar paraspinals; good myofascial movement noted;    Slump test   Findings Negative   Side Right   Prone Knee Bend Test   Findings Negative   Side Right   Transfers   Comments able to transfer sit<> without HHA without difficulty;    Ambulation/Gait   Gait Comments reciprocal gait pattern, slightly slower gait speed, normal base of support, good foot clearance;    Standardized Balance Assessment   Five times sit to stand comments  13.8 sec without HHA (>10 sec indicates incresaed risk for falls)   10 Meter Walk 1.05 m/s without AD (community ambulator)       TREATMENT: PT initiated HEP: Sitting and standing pelvic rocks anterior/posterior x10 reps each with cues for isolating just lumbo-pelvic movement and core stabilization; Supine: Lumbar trunk rotation x1 min with cues for avoiding painful ROM; Posterior/anterior pelvic rocks x5 reps;  Patient  required min-moderate verbal/tactile cues for correct exercise technique.                     PT Education - 12/13/15 1613    Education provided Yes   Education Details initiated HEP, see patient instructions;    Person(s) Educated Patient   Methods Explanation;Verbal cues   Comprehension Verbalized understanding;Returned demonstration;Verbal cues required             PT Long Term Goals - 12/13/15 1623    PT LONG TERM GOAL #1   Title Patient will be independent in home exercise program to improve strength/mobility for better  functional independence with ADLs. by 01/24/16   Time 6   Period Weeks   Status New   PT LONG TERM GOAL #2   Title Patient will have a decrease in back pain to 2/10 or less at worst to improve ability to tolerate standing and walking for longer durations for functional mobility by 01/24/16   Time 6   Period Weeks   Status New   PT LONG TERM GOAL #3   Title Patient will improve her Modified Oswestry Low Back Pain Disability Questionnaire to <20% as a demonstration of improved function. by 01/24/16   Time 6   Period Weeks   Status New   PT LONG TERM GOAL #4   Title Patient (< 66 years old) will complete five times sit to stand test in < 10 seconds indicating an increased LE strength and improved balance. by 01/24/16   Time 6   Period Weeks   Status New   PT LONG TERM GOAL #5   Title Patient will increase BLE gross strength to 4+/5 as to improve functional strength for independent gait, increased standing tolerance and increased ADL ability. by 01/24/16   Time 6   Period Weeks   Status New   Additional Long Term Goals   Additional Long Term Goals Yes   PT LONG TERM GOAL #6   Title Patient will increase lower extremity functional scale to >60/80 to demonstrate improved functional mobility and increased tolerance with ADLs.  by 02/24/16   Time 6   Period Weeks   Status New               Plan - 12/13/15 1615    Clinical Impression  Statement 40 yo Female reports increased back pain with RLE radiculopathy since summer 2016. She is s/p lumbar discectomy and reports resolution of RLE pain with pain only in low back with prolonged standing/walking. She exhibits increased tightness/stiffness with lumbar flexion and right lateral flexion. She also has spinal hypomobility along L3, L4, L5 due to pain. Patient has weakness in RLE particularly in hip and knee; She would benefit from skilled PT Intervention to improve lumbar ROM, strength and reduce back pain with ADLs for return to PLOF.    Rehab Potential Good   Clinical Impairments Affecting Rehab Potential positive: no radicular symptoms now, good caregiver support; negative: chronicity of pain; Patient's clinical presentation is stable as she is young with minimal co-morbidities and her pain is localized to her low back;    PT Frequency 2x / week   PT Duration 6 weeks   PT Treatment/Interventions Aquatic Therapy;Cryotherapy;Electrical Stimulation;Moist Heat;Traction;Therapeutic exercise;Therapeutic activities;Functional mobility training;Stair training;Gait training;Ultrasound;Patient/family education;Manual techniques;Energy conservation;Dry needling   PT Next Visit Plan advance HEP, TENs, work on lumbar ROM   PT Home Exercise Plan initiated- see patient instructions;    Consulted and Agree with Plan of Care Patient      Patient will benefit from skilled therapeutic intervention in order to improve the following deficits and impairments:  Hypomobility, Decreased activity tolerance, Decreased strength, Pain, Difficulty walking, Decreased mobility, Decreased range of motion, Improper body mechanics, Impaired flexibility  Visit Diagnosis: Right-sided low back pain without sciatica - Plan: PT plan of care cert/re-cert  Muscle weakness (generalized) - Plan: PT plan of care cert/re-cert  Difficulty in walking, not elsewhere classified - Plan: PT plan of care  cert/re-cert     Problem List Patient Active Problem List   Diagnosis Date Noted  . Herpes simplex 08/10/2015  . Vaginal discharge 08/01/2015  .  Genital sore 08/01/2015  . Abnormal mammogram of left breast 07/31/2015  . Medication monitoring encounter 07/23/2015  . Eczema 05/11/2015  . Subacute right lumbar radiculopathy 05/11/2015  . Overweight (BMI 25.0-29.9) 05/11/2015  . Hypertension   . Hyperlipidemia   . GERD (gastroesophageal reflux disease)   . Allergic rhinitis   . Irregular menstruation   . Carpal tunnel syndrome   . IFG (impaired fasting glucose)   . Chronic migraine     Micaiah Remillard PT, DPT 12/13/2015, 4:28 PM  [place] MAIN [facility] SERVICES [address] [place], [place], [address] Phone: [phone]   Fax:  [phone]  Name: DREANNA MEGGITT MRN: [id number] Date of Birth: November 21, 1975

## 2015-12-13 NOTE — Patient Instructions (Signed)
Pelvic Tilt  Lying on back with knees bent, Flatten back by tightening stomach muscles and rocking hips back Hold for 2-3 sec, Repeat __10__ times per set. Do __1__ sets per session. Do __2__ sessions per day.  http://orth.exer.us/134    Copyright  VHI. All rights reserved.   Lower Trunk Rotation Stretch  Lying on back with knees bent, Keeping back flat and feet together, rotate knees side to side slowly and in pain free range of motion.  Hold _2___ seconds. Repeat for 1-2 minutes. Do __1__ sets per session. Do __2-3__ sessions per day.  http://orth.exer.us/122     Pelvic Anterior / Posterior Tilt / Pelvic Rock    Stand with upper back and buttocks touching wall, (arms can be behind your back) A.Anterior tilt: Raise chest and arch back slightly. B.Posterior tilt: Contract abdominals and flatten back. Rock _10___ times per session. Do __2-3__ sessions per day Progression: Perform pelvic tilt away from wall.  Copyright  VHI. All rights reserved.

## 2015-12-17 ENCOUNTER — Encounter: Payer: Self-pay | Admitting: Physical Therapy

## 2015-12-17 ENCOUNTER — Ambulatory Visit: Payer: BC Managed Care – PPO | Admitting: Physical Therapy

## 2015-12-17 DIAGNOSIS — M545 Low back pain, unspecified: Secondary | ICD-10-CM

## 2015-12-17 DIAGNOSIS — M6281 Muscle weakness (generalized): Secondary | ICD-10-CM

## 2015-12-17 DIAGNOSIS — R262 Difficulty in walking, not elsewhere classified: Secondary | ICD-10-CM

## 2015-12-17 NOTE — Therapy (Signed)
[place] MAIN [facility] SERVICES [address] [place], [place], [address] Phone: [phone]   Fax:  [phone]  Physical Therapy Treatment  Patient Details  Name: Carrie Cross MRN: [id number] Date of Birth: 09-26-1975 Referring Provider: Kary Kos MD  Encounter Date: 12/17/2015      PT End of Session - 12/17/15 0855    Visit Number 2   Number of Visits 13   Date for PT Re-Evaluation 01/24/16   PT Start Time 0845   PT Stop Time 0930   PT Time Calculation (min) 45 min   Activity Tolerance Patient tolerated treatment well;No increased pain   Behavior During Therapy [facility] for tasks assessed/performed      Past Medical History  Diagnosis Date  . Hypertension     Controlled per pt  . Hyperlipidemia   . GERD (gastroesophageal reflux disease)   . Allergic rhinitis   . Irregular menstruation   . Carpal tunnel syndrome   . IFG (impaired fasting glucose)   . Chronic migraine   . Hx of pyelonephritis     as a child    History reviewed. No pertinent past surgical history.  There were no vitals filed for this visit.      Subjective Assessment - 12/17/15 0848    Subjective Patient reports increased stiffness today; She was busy over the weekend and had a long car ride which increased her stiffness. She denies any back pain currently but reports that its just tight. Reports compliance with HEP;    Pertinent History personal factors affecting rehab: chronic back pain, fear of re-injury   How long can you sit comfortably? has to re-adjust but doesn't feel any RLE pain;    How long can you stand comfortably? longest stood is 5-10 min with no pain; but hasn't tried standing long time;    How long can you walk comfortably? limited to <20 min due to increased back pain;    Diagnostic tests MRI showed L5-S1 severe disc bulge prior to surgery; no recent images;    Patient Stated Goals get back to PLOF; get back to walking as before; being able  to bend down towards floor, squatting;    Currently in Pain? No/denies   Pain Onset 1 to 4 weeks ago      TREATMENT: Warm up on Nustep BUE/BLE level 2 x3 min (unbilled)  Hooklying: Pball lumbar trunk rotation x1 min with cues to avoid painful ROM;  Pball bridges x15 with arms across chest and cues to increase core stabilization for better trunk stability;   Qped; Cat/camel stretch 5 sec hold x5 reps; Ardine Eng pose 15 sec hold x2 reps;  Sitting on pball: Gentle bounce x1 min; Pelvic rocks, anterior/posterior x10 reps with cues to avoid LE movement and isolate just pelvic movement; Pelvic rocks side/side lateral flexion x10 reps; Pelvic circles clockwise with cues for isolating just pelvic movement and to work on improving smoothness of motion; Patient reports slight increase in right low back pain of 1/10 following seated pball exercise;   Standing with back against wall, pelvic rocks forward/backward x10 reps; Facing wall lumbar extension with cues to relax into increased extension x10 reps to reduce back pain and improve lumbar mobility;  Left side to wall, right lateral flexion x10 reps;  BLE leg press, 90# x12; RLE only leg press 45# x10 reps with cues to slow down eccentric return for better quad strengthening; RLE  Only leg press heel raise 45# x12 with cues  to maintain knee extension for isolating calf strengthening  Standing heel off step calf stretch 20 sec hold x2 bilaterally; Standing hamstring stretch 20 sec hold x2 bilaterally; Patient required min-moderate verbal/tactile cues for correct exercise technique.   Finished with moist heat to low back x5 min in prone; (unbilled)                        PT Education - 12/17/15 0855    Education provided Yes   Education Details lumbar ROM, core stabilization   Person(s) Educated Patient   Methods Explanation;Verbal cues   Comprehension Verbalized understanding;Returned demonstration;Verbal cues  required             PT Long Term Goals - 12/13/15 1623    PT LONG TERM GOAL #1   Title Patient will be independent in home exercise program to improve strength/mobility for better functional independence with ADLs. by 01/24/16   Time 6   Period Weeks   Status New   PT LONG TERM GOAL #2   Title Patient will have a decrease in back pain to 2/10 or less at worst to improve ability to tolerate standing and walking for longer durations for functional mobility by 01/24/16   Time 6   Period Weeks   Status New   PT LONG TERM GOAL #3   Title Patient will improve her Modified Oswestry Low Back Pain Disability Questionnaire to <20% as a demonstration of improved function. by 01/24/16   Time 6   Period Weeks   Status New   PT LONG TERM GOAL #4   Title Patient (< 51 years old) will complete five times sit to stand test in < 10 seconds indicating an increased LE strength and improved balance. by 01/24/16   Time 6   Period Weeks   Status New   PT LONG TERM GOAL #5   Title Patient will increase BLE gross strength to 4+/5 as to improve functional strength for independent gait, increased standing tolerance and increased ADL ability. by 01/24/16   Time 6   Period Weeks   Status New   Additional Long Term Goals   Additional Long Term Goals Yes   PT LONG TERM GOAL #6   Title Patient will increase lower extremity functional scale to >60/80 to demonstrate improved functional mobility and increased tolerance with ADLs.  by 02/24/16   Time 6   Period Weeks   Status New               Plan - 12/17/15 [id number]    Clinical Impression Statement Instructed patient in advanced lumbar ROM/stability exercise for less stiffness. Patient did require min VCs to avoid painful ROM and to improve core stabilization/pelvic control for increased ROM and less stiffness. Also instructed patient in LE strengthening focusing on RLE for better motor control; Patient would benefit from additional skilled PT  Intervention to improve LE strength, lumbar mobility and reduce back pain;    Rehab Potential Good   Clinical Impairments Affecting Rehab Potential positive: no radicular symptoms now, good caregiver support; negative: chronicity of pain; Patient's clinical presentation is stable as she is young with minimal co-morbidities and her pain is localized to her low back;    PT Frequency 2x / week   PT Duration 6 weeks   PT Treatment/Interventions Aquatic Therapy;Cryotherapy;Electrical Stimulation;Moist Heat;Traction;Therapeutic exercise;Therapeutic activities;Functional mobility training;Stair training;Gait training;Ultrasound;Patient/family education;Manual techniques;Energy conservation;Dry needling   PT Next Visit Plan advance HEP, TENs, work on lumbar ROM  PT Home Exercise Plan initiated- see patient instructions;    Consulted and Agree with Plan of Care Patient      Patient will benefit from skilled therapeutic intervention in order to improve the following deficits and impairments:  Hypomobility, Decreased activity tolerance, Decreased strength, Pain, Difficulty walking, Decreased mobility, Decreased range of motion, Improper body mechanics, Impaired flexibility  Visit Diagnosis: Right-sided low back pain without sciatica  Muscle weakness (generalized)  Difficulty in walking, not elsewhere classified     Problem List Patient Active Problem List   Diagnosis Date Noted  . Herpes simplex 08/10/2015  . Vaginal discharge 08/01/2015  . Genital sore 08/01/2015  . Abnormal mammogram of left breast 07/31/2015  . Medication monitoring encounter 07/23/2015  . Eczema 05/11/2015  . Subacute right lumbar radiculopathy 05/11/2015  . Overweight (BMI 25.0-29.9) 05/11/2015  . Hypertension   . Hyperlipidemia   . GERD (gastroesophageal reflux disease)   . Allergic rhinitis   . Irregular menstruation   . Carpal tunnel syndrome   . IFG (impaired fasting glucose)   . Chronic migraine      Trotter,Margaret  PT, DPT  12/17/2015, 9:26 AM  [place] MAIN [facility] SERVICES [address] [place], [place], [address] Phone: [phone]   Fax:  [phone]  Name: Carrie Cross MRN: [id number] Date of Birth: [birth year]

## 2015-12-17 NOTE — Patient Instructions (Addendum)
Standing: Unilateral    Stand, one heel on stool, leg straight, standing leg slightly bent. Slowly lean forward, keeping back straight. Hold __15-20_ seconds. Repeat _2-3__ times per session. Do _2__ sessions per day.   Copyright  VHI. All rights reserved.  Calf Stretch    Place hands on wall at shoulder height. Keeping back leg straight, bend front leg, feet pointing forward, heels flat on floor. Lean forward slightly until stretch is felt in calf of back leg. Hold stretch _15-20__ seconds, breathing slowly in and out. Repeat stretch with other leg back. Do __2-3_ sessions per day. Variation: Use chair or table for support.  Copyright  VHI. All rights reserved.

## 2015-12-19 ENCOUNTER — Encounter: Payer: Self-pay | Admitting: Physical Therapy

## 2015-12-19 ENCOUNTER — Ambulatory Visit: Payer: BC Managed Care – PPO | Admitting: Physical Therapy

## 2015-12-19 DIAGNOSIS — R262 Difficulty in walking, not elsewhere classified: Secondary | ICD-10-CM

## 2015-12-19 DIAGNOSIS — M545 Low back pain, unspecified: Secondary | ICD-10-CM

## 2015-12-19 DIAGNOSIS — M6281 Muscle weakness (generalized): Secondary | ICD-10-CM

## 2015-12-19 NOTE — Patient Instructions (Addendum)
KNEE: Flexion - Prone    Bend knee. Raise heel toward buttocks. Do not raise hips. __10_ reps per set, _2__ sets per day, _5__ days per week   Copyright  VHI. All rights reserved.  EXTENSION: Prone - Knee Extended (Active)    Lie on stomach, legs straight. Lift right leg toward ceiling.  Complete _1__ sets of _10__ repetitions. Perform _2__ sessions per day.  http://gtsc.exer.us/71   Copyright  VHI. All rights reserved.  EXTENSION: Prone - Knee Flexed (Active)    Lie on stomach, right knee bent to 90. Lift leg toward ceiling. Use _0__ lbs. Complete _1__ sets of _10__ repetitions. Perform __2_ sessions per day.  http://gtsc.exer.us/67   Copyright  VHI. All rights reserved.

## 2015-12-19 NOTE — Therapy (Signed)
[place] MAIN [facility] SERVICES [address] [place], [place], [address] Phone: [phone]   Fax:  [phone]  Physical Therapy Treatment  Patient Details  Name: Carrie Cross MRN: [id number] Date of Birth: 1975/10/06 Referring Provider: Kary Kos MD  Encounter Date: 12/19/2015      PT End of Session - 12/19/15 0957    Visit Number 3   Number of Visits 13   Date for PT Re-Evaluation 01/24/16   PT Start Time 0845   PT Stop Time 0930   PT Time Calculation (min) 45 min   Activity Tolerance Patient tolerated treatment well;No increased pain   Behavior During Therapy [facility] for tasks assessed/performed      Past Medical History  Diagnosis Date  . Hypertension     Controlled per pt  . Hyperlipidemia   . GERD (gastroesophageal reflux disease)   . Allergic rhinitis   . Irregular menstruation   . Carpal tunnel syndrome   . IFG (impaired fasting glucose)   . Chronic migraine   . Hx of pyelonephritis     as a child    History reviewed. No pertinent past surgical history.  There were no vitals filed for this visit.      Subjective Assessment - 12/19/15 0848    Subjective Patient reports a little soreness in low back today; She reports having trouble with pelvic rock exercise not being sure if she is doing it right;    Pertinent History personal factors affecting rehab: chronic back pain, fear of re-injury   How long can you sit comfortably? has to re-adjust but doesn't feel any RLE pain;    How long can you stand comfortably? longest stood is 5-10 min with no pain; but hasn't tried standing long time;    How long can you walk comfortably? limited to <20 min due to increased back pain;    Diagnostic tests MRI showed L5-S1 severe disc bulge prior to surgery; no recent images;    Patient Stated Goals get back to PLOF; get back to walking as before; being able to bend down towards floor, squatting;    Currently in Pain? Yes   Pain  Score 3    Pain Location Back   Pain Orientation Lower   Pain Descriptors / Indicators Aching;Dull   Pain Onset 1 to 4 weeks ago            TREATMENT: Warm up on Nustep BUE/BLE level 2 x3 min (unbilled)  Hooklying: Anterior/posterior pelvic rocks x10 with diaphragmatic breathing for increased pelvic movement;  lumbar trunk rotation x1 min with cues to avoid painful ROM;  Bridges with alternate leg kick for increased hip strengthening x10 bilaterally with cues to increase core stabilization;  Prone: Press up on hands stretch into lumbar extension x5; Knee flexion quad stretch x10 AROM; Glute max hip extension x10 with cues to avoid trunk rotation; Hip extension x10 bilaterally with min Vcs to avoid trunk rotation and to not lift into pain;   Qped; Cat/camel stretch 5 sec hold x5 reps; Ardine Eng pose with right QL stretch 15 sec hold x2 reps;  Sitting, pelvic rocks forward/backward x10 reps with cues to avoid trunk movement and isolate just pelvic movement;  Standing with back against wall, pelvic rocks forward/backward x10 reps; Patient required mod VCS to avoid shoulder/thoracic movement and isolate just pelvic movement;    BLE leg press, 90# x15; BLE leg press, heel raises 90# x15 with min VCs to avoid knee flexion;  RLE only leg press 45# 2x10 reps with cues to slow down eccentric return for better quad strengthening;  Finished with moist heat to low back x5 min in prone; (unbilled)                      PT Education - 12/19/15 0956    Education provided Yes   Education Details lumbar ROM exercise, core strengthening   Person(s) Educated Patient   Methods Explanation;Verbal cues   Comprehension Verbalized understanding;Returned demonstration;Verbal cues required             PT Long Term Goals - 12/13/15 1623    PT LONG TERM GOAL #1   Title Patient will be independent in home exercise program to improve strength/mobility for better functional  independence with ADLs. by 01/24/16   Time 6   Period Weeks   Status New   PT LONG TERM GOAL #2   Title Patient will have a decrease in back pain to 2/10 or less at worst to improve ability to tolerate standing and walking for longer durations for functional mobility by 01/24/16   Time 6   Period Weeks   Status New   PT LONG TERM GOAL #3   Title Patient will improve her Modified Oswestry Low Back Pain Disability Questionnaire to <20% as a demonstration of improved function. by 01/24/16   Time 6   Period Weeks   Status New   PT LONG TERM GOAL #4   Title Patient (< 58 years old) will complete five times sit to stand test in < 10 seconds indicating an increased LE strength and improved balance. by 01/24/16   Time 6   Period Weeks   Status New   PT LONG TERM GOAL #5   Title Patient will increase BLE gross strength to 4+/5 as to improve functional strength for independent gait, increased standing tolerance and increased ADL ability. by 01/24/16   Time 6   Period Weeks   Status New   Additional Long Term Goals   Additional Long Term Goals Yes   PT LONG TERM GOAL #6   Title Patient will increase lower extremity functional scale to >60/80 to demonstrate improved functional mobility and increased tolerance with ADLs.  by 02/24/16   Time 6   Period Weeks   Status New               Plan - 12/19/15 0957    Clinical Impression Statement Instructed patient in lumbar ROM, core stabilization exercise. Also initiated hip extension strengthening as part of HEP; Patient required min VCs for correct exercise technique and to avoid trunk rotation during exercise. Patient reports slight ache in low back with advanced exercise. She was able to demonstrate better lumbopelvic control with pelvic rocks. Patient would benefit from additional skilled PT Intervention to improve lumbar ROM, reduce back pain and return to PLOF.    Rehab Potential Good   Clinical Impairments Affecting Rehab Potential  positive: no radicular symptoms now, good caregiver support; negative: chronicity of pain; Patient's clinical presentation is stable as she is young with minimal co-morbidities and her pain is localized to her low back;    PT Frequency 2x / week   PT Duration 6 weeks   PT Treatment/Interventions Aquatic Therapy;Cryotherapy;Electrical Stimulation;Moist Heat;Traction;Therapeutic exercise;Therapeutic activities;Functional mobility training;Stair training;Gait training;Ultrasound;Patient/family education;Manual techniques;Energy conservation;Dry needling   PT Next Visit Plan advance HEP, TENs, work on lumbar ROM   PT Home Exercise Plan advanced, see patient instructions;  Consulted and Agree with Plan of Care Patient      Patient will benefit from skilled therapeutic intervention in order to improve the following deficits and impairments:  Hypomobility, Decreased activity tolerance, Decreased strength, Pain, Difficulty walking, Decreased mobility, Decreased range of motion, Improper body mechanics, Impaired flexibility  Visit Diagnosis: Right-sided low back pain without sciatica  Muscle weakness (generalized)  Difficulty in walking, not elsewhere classified     Problem List Patient Active Problem List   Diagnosis Date Noted  . Herpes simplex 08/10/2015  . Vaginal discharge 08/01/2015  . Genital sore 08/01/2015  . Abnormal mammogram of left breast 07/31/2015  . Medication monitoring encounter 07/23/2015  . Eczema 05/11/2015  . Subacute right lumbar radiculopathy 05/11/2015  . Overweight (BMI 25.0-29.9) 05/11/2015  . Hypertension   . Hyperlipidemia   . GERD (gastroesophageal reflux disease)   . Allergic rhinitis   . Irregular menstruation   . Carpal tunnel syndrome   . IFG (impaired fasting glucose)   . Chronic migraine     Natilee Gauer PT, DPT 12/19/2015, 10:00 AM  [place] MAIN [facility] SERVICES [address] [place], [place],  [address] Phone: [phone]   Fax:  [phone]  Name: Carrie Cross MRN: [id number] Date of Birth: 01/31/1976

## 2015-12-24 ENCOUNTER — Ambulatory Visit: Payer: BC Managed Care – PPO | Admitting: Physical Therapy

## 2015-12-24 ENCOUNTER — Encounter: Payer: Self-pay | Admitting: Physical Therapy

## 2015-12-24 DIAGNOSIS — M545 Low back pain, unspecified: Secondary | ICD-10-CM

## 2015-12-24 DIAGNOSIS — M6281 Muscle weakness (generalized): Secondary | ICD-10-CM

## 2015-12-24 DIAGNOSIS — R262 Difficulty in walking, not elsewhere classified: Secondary | ICD-10-CM

## 2015-12-24 NOTE — Therapy (Signed)
[place] MAIN [facility] SERVICES [address] [place], [place], [address] Phone: [phone]   Fax:  [phone]  Physical Therapy Treatment  Patient Details  Name: Carrie Cross MRN: [id number] Date of Birth: 1975-10-18 Referring Provider: Kary Kos MD  Encounter Date: 12/24/2015      PT End of Session - 12/24/15 1633    Visit Number 4   Number of Visits 13   Date for PT Re-Evaluation 01/24/16   PT Start Time 1600   PT Stop Time 1640   PT Time Calculation (min) 40 min   Activity Tolerance Patient tolerated treatment well;No increased pain   Behavior During Therapy [facility] for tasks assessed/performed      Past Medical History  Diagnosis Date  . Hypertension     Controlled per pt  . Hyperlipidemia   . GERD (gastroesophageal reflux disease)   . Allergic rhinitis   . Irregular menstruation   . Carpal tunnel syndrome   . IFG (impaired fasting glucose)   . Chronic migraine   . Hx of pyelonephritis     as a child    History reviewed. No pertinent past surgical history.  There were no vitals filed for this visit.      Subjective Assessment - 12/24/15 1605    Subjective "I am feeling pretty good today. Its not hurting too bad, its just stiff"   Pertinent History personal factors affecting rehab: chronic back pain, fear of re-injury   How long can you sit comfortably? has to re-adjust but doesn't feel any RLE pain;    How long can you stand comfortably? longest stood is 5-10 min with no pain; but hasn't tried standing long time;    How long can you walk comfortably? limited to <20 min due to increased back pain;    Diagnostic tests MRI showed L5-S1 severe disc bulge prior to surgery; no recent images;    Patient Stated Goals get back to PLOF; get back to walking as before; being able to bend down towards floor, squatting;    Currently in Pain? Yes   Pain Score 3    Pain Location Back   Pain Orientation Lower;Right   Pain  Descriptors / Indicators Aching;Sore   Pain Onset 1 to 4 weeks ago      TREATMENT: Warm up on Nustep BUE/BLE level 2 x3 min (unbilled)  Hooklying: pball double knee to chest 5 sec hold x5; pball lumbar trunk rotation x2 min with cues to avoid painful ROM;  pball Bridges with arms across chest for increased hip strengthening x20 with min Vcs to increase core stabilization for less back discomfort;   Sidelying: Hip abduction circles, clockwise/counterclockwise 2 sets of 5 each direction; She required mod VCs to avoid trunk rotation and to isolate hip movement;   Qped; Cat/camel stretch 5 sec hold x5 reps x2 sets; Ardine Eng pose with right QL stretch 15 sec hold x2 reps; Trunk rotation with UE lift x5 reps  Required min VCs for correct positioning with stretches for optimum tissue extensibility;  Patient is independent in demonstrating anterior/posterior pelvic rocks and reports that she has been doing more as part of HEP;  PT applied interferential TENs to patient's lumbar paraspinals on right side, x15 min at tolerated intensity concurrent with moist heat in prone; Patient reports no pain after treatment session;  PT Education - 12/24/15 1633    Education provided Yes   Education Details lumbar ROM, stretches, exercise   Person(s) Educated Patient   Methods Explanation;Verbal cues   Comprehension Verbalized understanding;Returned demonstration;Verbal cues required             PT Long Term Goals - 12/13/15 1623    PT LONG TERM GOAL #1   Title Patient will be independent in home exercise program to improve strength/mobility for better functional independence with ADLs. by 01/24/16   Time 6   Period Weeks   Status New   PT LONG TERM GOAL #2   Title Patient will have a decrease in back pain to 2/10 or less at worst to improve ability to tolerate standing and walking for longer durations for functional mobility by 01/24/16    Time 6   Period Weeks   Status New   PT LONG TERM GOAL #3   Title Patient will improve her Modified Oswestry Low Back Pain Disability Questionnaire to <20% as a demonstration of improved function. by 01/24/16   Time 6   Period Weeks   Status New   PT LONG TERM GOAL #4   Title Patient (< 98 years old) will complete five times sit to stand test in < 10 seconds indicating an increased LE strength and improved balance. by 01/24/16   Time 6   Period Weeks   Status New   PT LONG TERM GOAL #5   Title Patient will increase BLE gross strength to 4+/5 as to improve functional strength for independent gait, increased standing tolerance and increased ADL ability. by 01/24/16   Time 6   Period Weeks   Status New   Additional Long Term Goals   Additional Long Term Goals Yes   PT LONG TERM GOAL #6   Title Patient will increase lower extremity functional scale to >60/80 to demonstrate improved functional mobility and increased tolerance with ADLs.  by 02/24/16   Time 6   Period Weeks   Status New               Plan - 12/24/15 1634    Clinical Impression Statement Instructed patient in lumbar ROM to reduce stiffness. patient able to initiate movement better with less cues. She does continue to require min VCs for correct positioning during stretches for increased tissue extensibility. She responded well to TENs with less back pain. She would benefit from additional skilled PT Intervention to improve lumbar ROM and reduce back pain;    Rehab Potential Good   Clinical Impairments Affecting Rehab Potential positive: no radicular symptoms now, good caregiver support; negative: chronicity of pain; Patient's clinical presentation is stable as she is young with minimal co-morbidities and her pain is localized to her low back;    PT Frequency 2x / week   PT Duration 6 weeks   PT Treatment/Interventions Aquatic Therapy;Cryotherapy;Electrical Stimulation;Moist Heat;Traction;Therapeutic exercise;Therapeutic  activities;Functional mobility training;Stair training;Gait training;Ultrasound;Patient/family education;Manual techniques;Energy conservation;Dry needling   PT Next Visit Plan advance HEP, TENs, work on lumbar ROM   PT Home Exercise Plan continue as previously given;    Consulted and Agree with Plan of Care Patient      Patient will benefit from skilled therapeutic intervention in order to improve the following deficits and impairments:  Hypomobility, Decreased activity tolerance, Decreased strength, Pain, Difficulty walking, Decreased mobility, Decreased range of motion, Improper body mechanics, Impaired flexibility  Visit Diagnosis: Right-sided low back pain without sciatica  Muscle weakness (generalized)  Difficulty in  walking, not elsewhere classified     Problem List Patient Active Problem List   Diagnosis Date Noted  . Herpes simplex 08/10/2015  . Vaginal discharge 08/01/2015  . Genital sore 08/01/2015  . Abnormal mammogram of left breast 07/31/2015  . Medication monitoring encounter 07/23/2015  . Eczema 05/11/2015  . Subacute right lumbar radiculopathy 05/11/2015  . Overweight (BMI 25.0-29.9) 05/11/2015  . Hypertension   . Hyperlipidemia   . GERD (gastroesophageal reflux disease)   . Allergic rhinitis   . Irregular menstruation   . Carpal tunnel syndrome   . IFG (impaired fasting glucose)   . Chronic migraine     Trotter,Margaret PT, DPT 12/24/2015, 4:35 PM  [place] MAIN [facility] SERVICES [address] [place], [place], [address] Phone: [phone]   Fax:  [phone]  Name: Carrie Cross MRN: [id number] Date of Birth: Aug 12, 1976

## 2015-12-27 ENCOUNTER — Ambulatory Visit: Payer: BC Managed Care – PPO | Admitting: Physical Therapy

## 2015-12-27 ENCOUNTER — Encounter: Payer: Self-pay | Admitting: Physical Therapy

## 2015-12-27 DIAGNOSIS — M545 Low back pain, unspecified: Secondary | ICD-10-CM

## 2015-12-27 DIAGNOSIS — R262 Difficulty in walking, not elsewhere classified: Secondary | ICD-10-CM

## 2015-12-27 DIAGNOSIS — M5431 Sciatica, right side: Secondary | ICD-10-CM

## 2015-12-27 DIAGNOSIS — M6281 Muscle weakness (generalized): Secondary | ICD-10-CM

## 2015-12-27 NOTE — Therapy (Signed)
[place] MAIN [facility] SERVICES [address] [place], [place], [address] Phone: [phone]   Fax:  [phone]  Physical Therapy Treatment  Patient Details  Name: Carrie Cross MRN: [id number] Date of Birth: October 15, 1975 Referring Provider: Kary Kos MD  Encounter Date: 12/27/2015      PT End of Session - 12/27/15 1311    Visit Number 5   Number of Visits 13   Date for PT Re-Evaluation 01/24/16   PT Start Time 1300   PT Stop Time 1345   PT Time Calculation (min) 45 min   Activity Tolerance Patient tolerated treatment well;No increased pain   Behavior During Therapy [facility] for tasks assessed/performed      Past Medical History  Diagnosis Date  . Hypertension     Controlled per pt  . Hyperlipidemia   . GERD (gastroesophageal reflux disease)   . Allergic rhinitis   . Irregular menstruation   . Carpal tunnel syndrome   . IFG (impaired fasting glucose)   . Chronic migraine   . Hx of pyelonephritis     as a child    History reviewed. No pertinent past surgical history.  There were no vitals filed for this visit.      Subjective Assessment - 12/27/15 1306    Subjective Patient reports feeling increased soreness and stiffness in lumbar spine; Patient denies any pain down legs;    Pertinent History personal factors affecting rehab: chronic back pain, fear of re-injury   How long can you sit comfortably? has to re-adjust but doesn't feel any RLE pain;    How long can you stand comfortably? longest stood is 5-10 min with no pain; but hasn't tried standing long time;    How long can you walk comfortably? limited to <20 min due to increased back pain;    Diagnostic tests MRI showed L5-S1 severe disc bulge prior to surgery; no recent images;    Patient Stated Goals get back to PLOF; get back to walking as before; being able to bend down towards floor, squatting;    Currently in Pain? Yes   Pain Score 3    Pain Location Back   Pain  Orientation Lower   Pain Descriptors / Indicators Aching;Sore   Pain Type Surgical pain   Pain Onset 1 to 4 weeks ago      TREATMENT: Prone: Press up on hands x10 with cues to keep hips on table and increase lumbar extension;  Qped: Childs pose 15 sec hold x2; Cat/camel stretch 5 sec hold x5 with min VCs to increase lumbopelvic movement;  Supine: Lumbar trunk rotation with cues to increase ROM for better flexibility x2 min; Pball double knee to chest 10 sec hold x5; Pball bridges with arms across chest x15 with cues to avoid side/side movement and increase core stabilization; BLE lift, suspended heel slides with cues to increase core stabilization x5 bilaterally;  Sidelying: Thoracolumbar trunk rotation 10 sec hold x5 each direction with min VCs to avoid painful ROM;  Prone: Alternate hip extension x10 with min VCs to keep knees straight for better hip extension and to avoid trunk rotation;  Alternate shoulder flexion x10 bilaterally with min VCs to avoid painful ROM; Alternate UE/LE lift combo x10 bilaterally with min VCs to avoid trunk rotation and to increase core stabilization for better trunk control;   Standing against wall: Anterior/posterior pelvic tilt x10; Right lateral flexion with extension x10 reps;  PT educated patient in body mechanics including: Push instead  of pull with education on ways to stay close to cart and avoid trunk rotation; Lifting floor to waist with cues to avoid trunk rotation and avoid stooping; Cues to increase foot movement when loading/unloading cart and avoid keeping feet planted; Education to keep items close when carrying (ie- the laundry basket) and to keep weight distributed evenly; Education on safe laundry techniques;                           PT Education - 12/27/15 1310    Education provided Yes   Education Details lumbar ROM/strengthening, HEP reinforced;   Person(s) Educated Patient   Methods  Explanation;Verbal cues   Comprehension Verbalized understanding;Returned demonstration;Verbal cues required             PT Long Term Goals - 12/13/15 1623    PT LONG TERM GOAL #1   Title Patient will be independent in home exercise program to improve strength/mobility for better functional independence with ADLs. by 01/24/16   Time 6   Period Weeks   Status New   PT LONG TERM GOAL #2   Title Patient will have a decrease in back pain to 2/10 or less at worst to improve ability to tolerate standing and walking for longer durations for functional mobility by 01/24/16   Time 6   Period Weeks   Status New   PT LONG TERM GOAL #3   Title Patient will improve her Modified Oswestry Low Back Pain Disability Questionnaire to <20% as a demonstration of improved function. by 01/24/16   Time 6   Period Weeks   Status New   PT LONG TERM GOAL #4   Title Patient (< 29 years old) will complete five times sit to stand test in < 10 seconds indicating an increased LE strength and improved balance. by 01/24/16   Time 6   Period Weeks   Status New   PT LONG TERM GOAL #5   Title Patient will increase BLE gross strength to 4+/5 as to improve functional strength for independent gait, increased standing tolerance and increased ADL ability. by 01/24/16   Time 6   Period Weeks   Status New   Additional Long Term Goals   Additional Long Term Goals Yes   PT LONG TERM GOAL #6   Title Patient will increase lower extremity functional scale to >60/80 to demonstrate improved functional mobility and increased tolerance with ADLs.  by 02/24/16   Time 6   Period Weeks   Status New               Plan - 12/27/15 1529    Clinical Impression Statement Patient instructed in lumbar ROM/core strengthening to increase support in lumbar spine. Patient reports less stiffness but had increased soreness along right lower back with advanced prone exercise. PT instructed patient in correct body mechanics for lifting  and work tasks to reduce risk for re-injury. She would benefit from additional skilled PT intervention to further instruction in body mechanics and to improve core strength for less back pain;    Rehab Potential Good   Clinical Impairments Affecting Rehab Potential positive: no radicular symptoms now, good caregiver support; negative: chronicity of pain; Patient's clinical presentation is stable as she is young with minimal co-morbidities and her pain is localized to her low back;    PT Frequency 2x / week   PT Duration 6 weeks   PT Treatment/Interventions Aquatic Therapy;Cryotherapy;Electrical Stimulation;Moist Heat;Traction;Therapeutic exercise;Therapeutic activities;Functional mobility  training;Stair training;Gait training;Ultrasound;Patient/family education;Manual techniques;Energy conservation;Dry needling   PT Next Visit Plan advance HEP, TENs, work on lumbar ROM   PT Home Exercise Plan continue as previously given;    Consulted and Agree with Plan of Care Patient      Patient will benefit from skilled therapeutic intervention in order to improve the following deficits and impairments:  Hypomobility, Decreased activity tolerance, Decreased strength, Pain, Difficulty walking, Decreased mobility, Decreased range of motion, Improper body mechanics, Impaired flexibility  Visit Diagnosis: Right-sided low back pain without sciatica  Muscle weakness (generalized)  Difficulty in walking, not elsewhere classified  Sciatica, right     Problem List Patient Active Problem List   Diagnosis Date Noted  . Herpes simplex 08/10/2015  . Vaginal discharge 08/01/2015  . Genital sore 08/01/2015  . Abnormal mammogram of left breast 07/31/2015  . Medication monitoring encounter 07/23/2015  . Eczema 05/11/2015  . Subacute right lumbar radiculopathy 05/11/2015  . Overweight (BMI 25.0-29.9) 05/11/2015  . Hypertension   . Hyperlipidemia   . GERD (gastroesophageal reflux disease)   . Allergic  rhinitis   . Irregular menstruation   . Carpal tunnel syndrome   . IFG (impaired fasting glucose)   . Chronic migraine     Jaslen Adcox PT, DPT 12/27/2015, 3:37 PM  [place] MAIN [facility] SERVICES [address] [place], [place], [address] Phone: [phone]   Fax:  [phone]  Name: Carrie Cross MRN: [id number] Date of Birth: 1976-06-11

## 2015-12-31 ENCOUNTER — Ambulatory Visit: Payer: BC Managed Care – PPO

## 2016-01-01 ENCOUNTER — Encounter: Payer: Self-pay | Admitting: Physical Therapy

## 2016-01-01 ENCOUNTER — Ambulatory Visit: Payer: BC Managed Care – PPO | Attending: Neurosurgery | Admitting: Physical Therapy

## 2016-01-01 DIAGNOSIS — M6281 Muscle weakness (generalized): Secondary | ICD-10-CM | POA: Diagnosis present

## 2016-01-01 DIAGNOSIS — M545 Low back pain, unspecified: Secondary | ICD-10-CM

## 2016-01-01 DIAGNOSIS — R262 Difficulty in walking, not elsewhere classified: Secondary | ICD-10-CM | POA: Insufficient documentation

## 2016-01-01 NOTE — Therapy (Signed)
[place] MAIN [facility] SERVICES [address] [place], [place], [address] Phone: [phone]   Fax:  [phone]  Physical Therapy Treatment  Patient Details  Name: Carrie Cross MRN: [id number] Date of Birth: 05-27-1976 Referring Provider: Kary Kos MD  Encounter Date: 01/01/2016      PT End of Session - 01/01/16 1449    Visit Number 6   Number of Visits 13   Date for PT Re-Evaluation 01/24/16   PT Start Time [id number]   PT Stop Time 1515   PT Time Calculation (min) 43 min   Activity Tolerance Patient tolerated treatment well;Patient limited by pain   Behavior During Therapy [facility] for tasks assessed/performed      Past Medical History  Diagnosis Date  . Hypertension     Controlled per pt  . Hyperlipidemia   . GERD (gastroesophageal reflux disease)   . Allergic rhinitis   . Irregular menstruation   . Carpal tunnel syndrome   . IFG (impaired fasting glucose)   . Chronic migraine   . Hx of pyelonephritis     as a child    History reviewed. No pertinent past surgical history.  There were no vitals filed for this visit.      Subjective Assessment - 01/01/16 1439    Subjective Patient reports increased right sided low back pain today; She reports lifting a 52# bag of dog food without good mechanics; She reports that she is learning that she has to be careful with how she lifts.    Pertinent History personal factors affecting rehab: chronic back pain, fear of re-injury   How long can you sit comfortably? has to re-adjust but doesn't feel any RLE pain;    How long can you stand comfortably? longest stood is 5-10 min with no pain; but hasn't tried standing long time;    How long can you walk comfortably? limited to <20 min due to increased back pain;    Diagnostic tests MRI showed L5-S1 severe disc bulge prior to surgery; no recent images;    Patient Stated Goals get back to PLOF; get back to walking as before; being able to bend down  towards floor, squatting;    Currently in Pain? Yes   Pain Score 4    Pain Location Back   Pain Orientation Right;Lower   Pain Descriptors / Indicators Aching;Sore   Pain Type Acute pain   Pain Onset 1 to 4 weeks ago          TREATMENT: Patient exhibits increased tightness along right lumbar paraspinals; Educated patient in prone exercise: Press up on elbows 2x15 with cues to increase push through BUE and relax back muscles; Also instructed patient to relax glutes for increased lumbar extension; Prone press up on elbows x15 with RLE lateral flexion with LE; patient exhibits increased tightness in this position due to increased back pain; Educated patient in importance of using good body mechanics to reduce risk of re-injury; Also instructed patient to perform prone or standing lumbar extension immediately after lifting or feeling increased pain for better lumbar ROM and to reduce back pain;   PT applied interferential TENs to right lumbar paraspinals x15 min concurrent with cryotherapy;  Standing: Lumbar extension x15 with cues to relax glutes and keep elbows straight when facing wall; Standing lumbar extension against bar x15 with overpressure into lumbar extension; Educated patient in using towel roll when sitting and to start with prone exercise for less back discomfort following lumbar strain;  PT Education - 01/01/16 1449    Education provided Yes   Education Details lumbar ROM, strengthening, HEP reinforced;    Person(s) Educated Patient   Methods Explanation;Verbal cues   Comprehension Verbalized understanding;Returned demonstration;Verbal cues required             PT Long Term Goals - 12/13/15 1623    PT LONG TERM GOAL #1   Title Patient will be independent in home exercise program to improve strength/mobility for better functional independence with ADLs. by 01/24/16   Time 6   Period Weeks   Status New   PT LONG TERM  GOAL #2   Title Patient will have a decrease in back pain to 2/10 or less at worst to improve ability to tolerate standing and walking for longer durations for functional mobility by 01/24/16   Time 6   Period Weeks   Status New   PT LONG TERM GOAL #3   Title Patient will improve her Modified Oswestry Low Back Pain Disability Questionnaire to <20% as a demonstration of improved function. by 01/24/16   Time 6   Period Weeks   Status New   PT LONG TERM GOAL #4   Title Patient (40 years old) will complete five times sit to stand test in < 10 seconds indicating an increased LE strength and improved balance. by 01/24/16   Time 6   Period Weeks   Status New   PT LONG TERM GOAL #5   Title Patient will increase BLE gross strength to 4+/5 as to improve functional strength for independent gait, increased standing tolerance and increased ADL ability. by 01/24/16   Time 6   Period Weeks   Status New   Additional Long Term Goals   Additional Long Term Goals Yes   PT LONG TERM GOAL #6   Title Patient will increase lower extremity functional scale to >60/80 to demonstrate improved functional mobility and increased tolerance with ADLs.  by 02/24/16   Time 6   Period Weeks   Status New               Plan - 01/01/16 1507    Clinical Impression Statement Patient reports lifting heavy bag of dog food with immediate onset of low back pain; PT instructed patient in repeated extension. She does not have radicular symptoms but does have tightness in right lumbosacral paraspinals. She responded well to TENs with less back pain after treatment session. She would benefit from additional skilled PT Intervention to improve lumbar ROM and reduce back pain;    Rehab Potential Good   Clinical Impairments Affecting Rehab Potential positive: no radicular symptoms now, good caregiver support; negative: chronicity of pain; Patient's clinical presentation is stable as she is young with minimal co-morbidities and her  pain is localized to her low back;    PT Frequency 2x / week   PT Duration 6 weeks   PT Treatment/Interventions Aquatic Therapy;Cryotherapy;Electrical Stimulation;Moist Heat;Traction;Therapeutic exercise;Therapeutic activities;Functional mobility training;Stair training;Gait training;Ultrasound;Patient/family education;Manual techniques;Energy conservation;Dry needling   PT Next Visit Plan advance HEP, TENs, work on lumbar ROM   PT Home Exercise Plan continue as previously given;    Consulted and Agree with Plan of Care Patient      Patient will benefit from skilled therapeutic intervention in order to improve the following deficits and impairments:  Hypomobility, Decreased activity tolerance, Decreased strength, Pain, Difficulty walking, Decreased mobility, Decreased range of motion, Improper body mechanics, Impaired flexibility  Visit Diagnosis: Right-sided low back pain without sciatica  Muscle weakness (generalized)  Difficulty in walking, not elsewhere classified     Problem List Patient Active Problem List   Diagnosis Date Noted  . Herpes simplex 08/10/2015  . Vaginal discharge 08/01/2015  . Genital sore 08/01/2015  . Abnormal mammogram of left breast 07/31/2015  . Medication monitoring encounter 07/23/2015  . Eczema 05/11/2015  . Subacute right lumbar radiculopathy 05/11/2015  . Overweight (BMI 25.0-29.9) 05/11/2015  . Hypertension   . Hyperlipidemia   . GERD (gastroesophageal reflux disease)   . Allergic rhinitis   . Irregular menstruation   . Carpal tunnel syndrome   . IFG (impaired fasting glucose)   . Chronic migraine     Pessy Delamar PT, DPT 01/01/2016, 3:10 PM  [place] MAIN [facility] SERVICES [address] [place], [place], [address] Phone: [phone]   Fax:  [phone]  Name: FAITHLYN DOMRES MRN: [id number] Date of Birth: [birth year]

## 2016-01-03 ENCOUNTER — Encounter: Payer: Self-pay | Admitting: Physical Therapy

## 2016-01-03 ENCOUNTER — Ambulatory Visit: Payer: BC Managed Care – PPO | Admitting: Physical Therapy

## 2016-01-03 DIAGNOSIS — M545 Low back pain, unspecified: Secondary | ICD-10-CM

## 2016-01-03 DIAGNOSIS — M6281 Muscle weakness (generalized): Secondary | ICD-10-CM

## 2016-01-03 DIAGNOSIS — R262 Difficulty in walking, not elsewhere classified: Secondary | ICD-10-CM

## 2016-01-03 NOTE — Therapy (Signed)
[place] MAIN [facility] SERVICES [address] [place], [place], [address] Phone: [phone]   Fax:  [phone]  Physical Therapy Treatment  Patient Details  Name: Carrie Cross MRN: [id number] Date of Birth: [birth year] Referring Provider: Kary Kos MD  Encounter Date: 01/03/2016      PT End of Session - 01/03/16 1328    Visit Number 7   Number of Visits 13   Date for PT Re-Evaluation 01/24/16   PT Start Time 1300   PT Stop Time 1345   PT Time Calculation (min) 45 min   Activity Tolerance Patient tolerated treatment well;No increased pain   Behavior During Therapy [facility] for tasks assessed/performed      Past Medical History  Diagnosis Date  . Hypertension     Controlled per pt  . Hyperlipidemia   . GERD (gastroesophageal reflux disease)   . Allergic rhinitis   . Irregular menstruation   . Carpal tunnel syndrome   . IFG (impaired fasting glucose)   . Chronic migraine   . Hx of pyelonephritis     as a child    History reviewed. No pertinent past surgical history.  There were no vitals filed for this visit.      Subjective Assessment - 01/03/16 1326    Subjective Patient reports feeling "a little better" than earlier this week. she continues to have pain in her right low back;    Pertinent History personal factors affecting rehab: chronic back pain, fear of re-injury   How long can you sit comfortably? has to re-adjust but doesn't feel any RLE pain;    How long can you stand comfortably? longest stood is 5-10 min with no pain; but hasn't tried standing long time;    How long can you walk comfortably? limited to <20 min due to increased back pain;    Diagnostic tests MRI showed L5-S1 severe disc bulge prior to surgery; no recent images;    Patient Stated Goals get back to PLOF; get back to walking as before; being able to bend down towards floor, squatting;    Currently in Pain? Yes   Pain Score 2    Pain Location Back   Pain Orientation Right;Lower   Pain Descriptors / Indicators Aching;Sore   Pain Type Chronic pain   Pain Onset 1 to 4 weeks ago          TREATMENT: PT assessed patient's lumbar spine. She reports increased pain along right posterior hip with PA mobs along L3, L4, L5; She also exhibits slight tightness in right lumbosacral paraspinals; PT performed grade II-III PA mobs 10 sec bouts L1, L2, L3, L4, L5 x2 sets; PT assessed myofascial tightness in right lumbar paraspinals;  PT applied lumbar mechanical traction in prone, 20 sec pull, 10 sec rest, 80# pull, 50# rest force x12 min; Patient reports less pain in right posterior hip but still has tenderness to palpation of right lumbosacral paraspinals; PT applied interferential TENs to right lumbar paraspinals at tolerated intensity x20 min concurrent with moist heat;                        PT Education - 01/03/16 1327    Education provided Yes   Education Details traction, ROM, HEP reinforced;    Person(s) Educated Patient   Methods Explanation;Verbal cues   Comprehension Verbalized understanding;Returned demonstration;Verbal cues required             PT Long Term Goals -  12/13/15 1623    PT LONG TERM GOAL #1   Title Patient will be independent in home exercise program to improve strength/mobility for better functional independence with ADLs. by 01/24/16   Time 6   Period Weeks   Status New   PT LONG TERM GOAL #2   Title Patient will have a decrease in back pain to 2/10 or less at worst to improve ability to tolerate standing and walking for longer durations for functional mobility by 01/24/16   Time 6   Period Weeks   Status New   PT LONG TERM GOAL #3   Title Patient will improve her Modified Oswestry Low Back Pain Disability Questionnaire to <20% as a demonstration of improved function. by 01/24/16   Time 6   Period Weeks   Status New   PT LONG TERM GOAL #4   Title Patient (< 40 years old) will complete  five times sit to stand test in < 10 seconds indicating an increased LE strength and improved balance. by 01/24/16   Time 6   Period Weeks   Status New   PT LONG TERM GOAL #5   Title Patient will increase BLE gross strength to 4+/5 as to improve functional strength for independent gait, increased standing tolerance and increased ADL ability. by 01/24/16   Time 6   Period Weeks   Status New   Additional Long Term Goals   Additional Long Term Goals Yes   PT LONG TERM GOAL #6   Title Patient will increase lower extremity functional scale to >60/80 to demonstrate improved functional mobility and increased tolerance with ADLs.  by 02/24/16   Time 6   Period Weeks   Status New               Plan - 01/03/16 1617    Clinical Impression Statement Patient reports continued pain in right sided low back, however pain is less than earlier in the week. Patient responded well to traction and to TENs with less back pain. she would benefit from additional skilled PT intervention to improve lumbosacral ROM, reduce back pain and return to PLOF.    Rehab Potential Good   Clinical Impairments Affecting Rehab Potential positive: no radicular symptoms now, good caregiver support; negative: chronicity of pain; Patient's clinical presentation is stable as she is young with minimal co-morbidities and her pain is localized to her low back;    PT Frequency 2x / week   PT Duration 6 weeks   PT Treatment/Interventions Aquatic Therapy;Cryotherapy;Electrical Stimulation;Moist Heat;Traction;Therapeutic exercise;Therapeutic activities;Functional mobility training;Stair training;Gait training;Ultrasound;Patient/family education;Manual techniques;Energy conservation;Dry needling   PT Next Visit Plan advance HEP, TENs, work on lumbar ROM   PT Home Exercise Plan continue as previously given;    Consulted and Agree with Plan of Care Patient      Patient will benefit from skilled therapeutic intervention in order to  improve the following deficits and impairments:  Hypomobility, Decreased activity tolerance, Decreased strength, Pain, Difficulty walking, Decreased mobility, Decreased range of motion, Improper body mechanics, Impaired flexibility  Visit Diagnosis: Right-sided low back pain without sciatica  Muscle weakness (generalized)  Difficulty in walking, not elsewhere classified     Problem List Patient Active Problem List   Diagnosis Date Noted  . Herpes simplex 08/10/2015  . Vaginal discharge 08/01/2015  . Genital sore 08/01/2015  . Abnormal mammogram of left breast 07/31/2015  . Medication monitoring encounter 07/23/2015  . Eczema 05/11/2015  . Subacute right lumbar radiculopathy 05/11/2015  . Overweight (BMI  25.0-29.9) 05/11/2015  . Hypertension   . Hyperlipidemia   . GERD (gastroesophageal reflux disease)   . Allergic rhinitis   . Irregular menstruation   . Carpal tunnel syndrome   . IFG (impaired fasting glucose)   . Chronic migraine     Lexine Jaspers PT, DPT 01/03/2016, 4:18 PM  [place] MAIN [facility] SERVICES [address] [place], [place], [address] Phone: [phone]   Fax:  [phone]  Name: ODALI ISMAIL MRN: [id number] Date of Birth: 1975-12-03

## 2016-01-08 ENCOUNTER — Ambulatory Visit: Payer: BC Managed Care – PPO | Admitting: Physical Therapy

## 2016-01-08 ENCOUNTER — Encounter: Payer: Self-pay | Admitting: Physical Therapy

## 2016-01-08 DIAGNOSIS — M6281 Muscle weakness (generalized): Secondary | ICD-10-CM

## 2016-01-08 DIAGNOSIS — M545 Low back pain, unspecified: Secondary | ICD-10-CM

## 2016-01-08 DIAGNOSIS — R262 Difficulty in walking, not elsewhere classified: Secondary | ICD-10-CM

## 2016-01-08 NOTE — Therapy (Signed)
[place] MAIN [facility] SERVICES [address] [place], [place], [address] Phone: [phone]   Fax:  [phone]  Physical Therapy Treatment/Progress Note  Patient Details  Name: Carrie Cross MRN: [id number] Date of Birth: 09-19-1975 Referring Provider: Kary Kos MD  Encounter Date: 01/08/2016      PT End of Session - 01/08/16 0911    Visit Number 8   Number of Visits 13   Date for PT Re-Evaluation 01/24/16   PT Start Time 0849   PT Stop Time 0930   PT Time Calculation (min) 41 min   Activity Tolerance Patient tolerated treatment well;No increased pain   Behavior During Therapy [facility] for tasks assessed/performed      Past Medical History  Diagnosis Date  . Hypertension     Controlled per pt  . Hyperlipidemia   . GERD (gastroesophageal reflux disease)   . Allergic rhinitis   . Irregular menstruation   . Carpal tunnel syndrome   . IFG (impaired fasting glucose)   . Chronic migraine   . Hx of pyelonephritis     as a child    History reviewed. No pertinent past surgical history.  There were no vitals filed for this visit.      Subjective Assessment - 01/08/16 0909    Subjective Patient reports continued pain along right lumbar spine into right hip; She reports pain is mild but doesn't really go away;    Pertinent History personal factors affecting rehab: chronic back pain, fear of re-injury   How long can you sit comfortably? has to re-adjust but doesn't feel any RLE pain;    How long can you stand comfortably? longest stood is 5-10 min with no pain; but hasn't tried standing long time;    How long can you walk comfortably? limited to <20 min due to increased back pain;    Diagnostic tests MRI showed L5-S1 severe disc bulge prior to surgery; no recent images;    Patient Stated Goals get back to PLOF; get back to walking as before; being able to bend down towards floor, squatting;    Currently in Pain? Yes   Pain Score 2    Pain Location Hip   Pain Orientation Right;Lower   Pain Descriptors / Indicators Aching;Sore   Pain Type Chronic pain   Pain Onset 1 to 4 weeks ago            [facility] PT Assessment - 01/08/16 0001    Observation/Other Assessments   Modified Oswertry 18% (minimal disability, improved from 12/13/15 which was 36%)   Lower Extremity Functional Scale  67/80 (the higher the score the less disability) improved from 12/13/15 which was 39/80         TREATMENT:  PT applied lumbar mechanical traction in prone, 20 sec pull, 10 sec rest, 90# pull, 60# rest force x15 min;  Patient reports no right posterior hip pain but does have a mild soreness in right lumbar paraspinals at 1/10;  PT instructed patient in LEFs, Modified oswestry, see above; Patient continues to have discomfort with lifting. She has been educated on some lifting mechanics but would benefit from additional skilled intervention to reinforce lifting for return to work status;                      PT Education - 01/08/16 0910    Education provided Yes   Education Details traction, ROM, HEP reinforced;   Person(s) Educated Patient   Methods  Explanation;Verbal cues   Comprehension Verbalized understanding;Returned demonstration;Verbal cues required             PT Long Term Goals - 01/08/16 0911    PT LONG TERM GOAL #1   Title Patient will be independent in home exercise program to improve strength/mobility for better functional independence with ADLs. by 01/24/16   Time 6   Period Weeks   Status On-going   PT LONG TERM GOAL #2   Title Patient will have a decrease in back pain to 2/10 or less at worst to improve ability to tolerate standing and walking for longer durations for functional mobility by 01/24/16   Time 6   Period Weeks   Status Partially Met   PT LONG TERM GOAL #3   Title Patient will improve her Modified Oswestry Low Back Pain Disability Questionnaire to <20% as a demonstration of improved  function. by 01/24/16   Time 6   Period Weeks   Status Achieved   PT LONG TERM GOAL #4   Title Patient (< 34 years old) will complete five times sit to stand test in < 10 seconds indicating an increased LE strength and improved balance. by 01/24/16   Time 6   Period Weeks   Status On-going   PT LONG TERM GOAL #5   Title Patient will increase BLE gross strength to 4+/5 as to improve functional strength for independent gait, increased standing tolerance and increased ADL ability. by 01/24/16   Time 6   Period Weeks   Status On-going   PT LONG TERM GOAL #6   Title Patient will increase lower extremity functional scale to >60/80 to demonstrate improved functional mobility and increased tolerance with ADLs.  by 02/24/16   Time 6   Period Weeks   Status Achieved               Plan - 01/08/16 1034    Clinical Impression Statement Patient has continued pain in right lower back pain which doesn't change much. She reports having a chronic ache which is not relieved with exercise or rest. She is able to demonstrate improved mobility with less back pain as reported on LEFs and Modified oswestry survey. However she would benefit from additional skilled PT Intervention to reinforce lifting mechanics and body mechanics to reduce risk for re-injury.    Rehab Potential Good   Clinical Impairments Affecting Rehab Potential positive: no radicular symptoms now, good caregiver support; negative: chronicity of pain; Patient's clinical presentation is stable as she is young with minimal co-morbidities and her pain is localized to her low back;    PT Frequency 2x / week   PT Duration 6 weeks   PT Treatment/Interventions Aquatic Therapy;Cryotherapy;Electrical Stimulation;Moist Heat;Traction;Therapeutic exercise;Therapeutic activities;Functional mobility training;Stair training;Gait training;Ultrasound;Patient/family education;Manual techniques;Energy conservation;Dry needling   PT Next Visit Plan advance HEP,  TENs, work on lumbar ROM   PT Home Exercise Plan continue as previously given;    Consulted and Agree with Plan of Care Patient      Patient will benefit from skilled therapeutic intervention in order to improve the following deficits and impairments:  Hypomobility, Decreased activity tolerance, Decreased strength, Pain, Difficulty walking, Decreased mobility, Decreased range of motion, Improper body mechanics, Impaired flexibility  Visit Diagnosis: Right-sided low back pain without sciatica  Muscle weakness (generalized)  Difficulty in walking, not elsewhere classified     Problem List Patient Active Problem List   Diagnosis Date Noted  . Herpes simplex 08/10/2015  . Vaginal discharge 08/01/2015  .  Genital sore 08/01/2015  . Abnormal mammogram of left breast 07/31/2015  . Medication monitoring encounter 07/23/2015  . Eczema 05/11/2015  . Subacute right lumbar radiculopathy 05/11/2015  . Overweight (BMI 25.0-29.9) 05/11/2015  . Hypertension   . Hyperlipidemia   . GERD (gastroesophageal reflux disease)   . Allergic rhinitis   . Irregular menstruation   . Carpal tunnel syndrome   . IFG (impaired fasting glucose)   . Chronic migraine     Trotter,Margaret PT, DPT 01/08/2016, 10:39 AM  [place] MAIN [facility] SERVICES [address] [place], [place], [address] Phone: [phone]   Fax:  [phone]  Name: Carrie Cross MRN: [id number] Date of Birth: March 11, 1976

## 2016-01-09 ENCOUNTER — Encounter: Payer: Self-pay | Admitting: Family Medicine

## 2016-01-09 ENCOUNTER — Ambulatory Visit (INDEPENDENT_AMBULATORY_CARE_PROVIDER_SITE_OTHER): Payer: BC Managed Care – PPO | Admitting: Family Medicine

## 2016-01-09 VITALS — BP 100/64 | HR 90 | Temp 98.4°F | Resp 16 | Wt 167.0 lb

## 2016-01-09 DIAGNOSIS — E785 Hyperlipidemia, unspecified: Secondary | ICD-10-CM

## 2016-01-09 DIAGNOSIS — I10 Essential (primary) hypertension: Secondary | ICD-10-CM

## 2016-01-09 DIAGNOSIS — K219 Gastro-esophageal reflux disease without esophagitis: Secondary | ICD-10-CM

## 2016-01-09 DIAGNOSIS — R7301 Impaired fasting glucose: Secondary | ICD-10-CM

## 2016-01-09 DIAGNOSIS — Z5181 Encounter for therapeutic drug level monitoring: Secondary | ICD-10-CM | POA: Diagnosis not present

## 2016-01-09 MED ORDER — OMEPRAZOLE 20 MG PO CPDR: 20.0000 mg | DELAYED_RELEASE_CAPSULE | Freq: Every day | ORAL | Status: DC | PRN

## 2016-01-09 MED ORDER — ATORVASTATIN CALCIUM 20 MG PO TABS: 20.0000 mg | ORAL_TABLET | Freq: Every day | ORAL | Status: DC

## 2016-01-09 MED ORDER — METOPROLOL SUCCINATE ER 100 MG PO TB24: 100.0000 mg | ORAL_TABLET | Freq: Every day | ORAL | Status: DC

## 2016-01-09 MED ORDER — HYDROCHLOROTHIAZIDE 12.5 MG PO CAPS: 12.5000 mg | ORAL_CAPSULE | Freq: Every day | ORAL | Status: DC

## 2016-01-09 MED ORDER — FEXOFENADINE HCL 180 MG PO TABS: 180.0000 mg | ORAL_TABLET | Freq: Every day | ORAL | Status: DC | PRN

## 2016-01-09 NOTE — Assessment & Plan Note (Signed)
Check lipids today 

## 2016-01-09 NOTE — Assessment & Plan Note (Signed)
Low pressure today may be because of large cuff use; will have patient monitor BP at home 2-3 x weekly and call me with update; would love to decrease medicines if possible

## 2016-01-09 NOTE — Assessment & Plan Note (Signed)
Check A1c; limit whites

## 2016-01-09 NOTE — Patient Instructions (Addendum)
Measure blood pressure at home and let me know those numbers in 2 weeks Check 2-3 x a week Let's get labs today If you have not heard anything from my staff in a week about any orders/referrals/studies from today, please contact us here to follow-up [phone] Try to limit saturated fats in your diet (bologna, hot dogs, barbeque, cheeseburgers, hamburgers, steak, bacon, sausage, cheese, etc.) and get more fresh fruits, vegetables, and whole grains

## 2016-01-09 NOTE — Assessment & Plan Note (Signed)
Check Cr, K+ and SGPT

## 2016-01-09 NOTE — Progress Notes (Signed)
BP 100/64 mmHg  Pulse 90  Temp(Src) 98.4 F (36.9 C) (Oral)  Resp 16  Wt 167 lb (75.751 kg)  SpO2 96%  Large BP cuff was used  Subjective:    Patient ID: Carrie Cross [place], female    DOB: 01/29/1976, 40 y.o.   MRN: [id number]  HPI: Carrie Cross is a 40 y.o. female  Chief Complaint  Patient presents with  . Follow-up    6 month  . Labs Only   Her blood is under excellent control today, lower than expected; we reviewed her previous BPs which are usually 120s over 80s; she does not feel dizzy or lightheaded; a large cuff was used today to check her pressure; she walked two miles with her mother yesterday and that might have been overdoing it but usually is not dizzy  She has high cholesterol; thinks she is trying to eat better; eats fast food 2x a week; burger with some fries, no bread; no frozen dinners; might have fried chicken or pork chops once a week; not a big fruit eater; one or two veggies at dinner; meat with meals;   She had back surgery in March; she is doing PT twice a week; that is helping; feels achy sometimes; Dr. Saintclair Halsted at [facility]  Rare episode, HSV; has just used the one treatment; using L-lysine  Prediabetes; last A1c was 6.2  Depression screen [facility] 2/9 01/09/2016  Decreased Interest 0  Down, Depressed, Hopeless 0  PHQ - 2 Score 0   Relevant past medical, surgical, family and social history reviewed Past Medical History  Diagnosis Date  . Hypertension     Controlled per pt  . Hyperlipidemia   . GERD (gastroesophageal reflux disease)   . Allergic rhinitis   . Irregular menstruation   . Carpal tunnel syndrome   . IFG (impaired fasting glucose)   . Chronic migraine   . Hx of pyelonephritis     as a child   Past Surgical History  Procedure Laterality Date  . Back surgery  [id number]    disc shaved down  still has not gone back to work  Family History  Problem Relation Age of Onset  . Stroke Neg Hx   . Heart disease Neg Hx   .  Diabetes Neg Hx   . Cancer Neg Hx    Social History  Substance Use Topics  . Smoking status: Former Smoker -- 0.50 packs/day for 7 years    Types: Cigarettes    Quit date: 06/02/2011  . Smokeless tobacco: Never Used  . Alcohol Use: 1.8 oz/week    3 Glasses of wine per week   Interim medical history since last visit reviewed. Allergies and medications reviewed  Review of Systems Per HPI unless specifically indicated above     Objective:    BP 100/64 mmHg  Pulse 90  Temp(Src) 98.4 F (36.9 C) (Oral)  Resp 16  Wt 167 lb (75.751 kg)  SpO2 96%  Wt Readings from Last 3 Encounters:  01/09/16 167 lb (75.751 kg)  08/01/15 166 lb (75.297 kg)  05/11/15 163 lb (73.936 kg)    Physical Exam  Constitutional: She appears well-developed and well-nourished. No distress.  HENT:  Head: Normocephalic and atraumatic.  Eyes: EOM are normal. No scleral icterus.  Neck: No thyromegaly present.  Cardiovascular: Normal rate, regular rhythm and normal heart sounds.   No murmur heard. Pulmonary/Chest: Effort normal and breath sounds normal. No respiratory distress. She has no wheezes.  Abdominal: Soft.  Bowel sounds are normal. She exhibits no distension.  Musculoskeletal: Normal range of motion. She exhibits no edema.  Neurological: She is alert. She exhibits normal muscle tone.  Skin: Skin is warm and dry. She is not diaphoretic. No pallor.  Psychiatric: She has a normal mood and affect. Her behavior is normal. Judgment and thought content normal.      Assessment & Plan:   Problem List Items Addressed This Visit      Cardiovascular and Mediastinum   Hypertension - Primary    Low pressure today may be because of large cuff use; will have patient monitor BP at home 2-3 x weekly and call me with update; would love to decrease medicines if possible      Relevant Medications   hydrochlorothiazide (MICROZIDE) 12.5 MG capsule   metoprolol succinate (TOPROL-XL) 100 MG 24 hr tablet    atorvastatin (LIPITOR) 20 MG tablet     Digestive   GERD (gastroesophageal reflux disease)    Limit PPI, limit triggers      Relevant Medications   omeprazole (PRILOSEC) 20 MG capsule     Endocrine   IFG (impaired fasting glucose)    Check A1c; limit whites      Relevant Orders   Hemoglobin A1c     Other   Hyperlipidemia    Check lipids today      Relevant Medications   hydrochlorothiazide (MICROZIDE) 12.5 MG capsule   metoprolol succinate (TOPROL-XL) 100 MG 24 hr tablet   atorvastatin (LIPITOR) 20 MG tablet   Other Relevant Orders   Lipid Panel w/o Chol/HDL Ratio   Medication monitoring encounter    Check Cr, K+ and SGPT      Relevant Orders   Comprehensive metabolic panel       Follow up plan: Return in about 6 months (around 07/11/2016) for visit and fasting labs.  An after-visit summary was printed and given to the patient at [place].  Please see the patient instructions which may contain other information and recommendations beyond what is mentioned above in the assessment and plan.  Meds ordered this encounter  Medications  . hydrochlorothiazide (MICROZIDE) 12.5 MG capsule    Sig: Take 1 capsule (12.5 mg total) by mouth daily.    Dispense:  90 capsule    Refill:  3  . metoprolol succinate (TOPROL-XL) 100 MG 24 hr tablet    Sig: Take 1 tablet (100 mg total) by mouth daily. Take with or immediately following a meal.    Dispense:  90 tablet    Refill:  3  . omeprazole (PRILOSEC) 20 MG capsule    Sig: Take 1 capsule (20 mg total) by mouth daily as needed. Caution:prolonged use may increase risk of pneumonia, colitis, osteoporosis, anemia    Dispense:  90 capsule    Refill:  1  . atorvastatin (LIPITOR) 20 MG tablet    Sig: Take 1 tablet (20 mg total) by mouth at bedtime.    Dispense:  90 tablet    Refill:  1  . fexofenadine (ALLEGRA) 180 MG tablet    Sig: Take 1 tablet (180 mg total) by mouth daily as needed for allergies or rhinitis.    Dispense:  90  tablet    Refill:  3    Orders Placed This Encounter  Procedures  . Lipid Panel w/o Chol/HDL Ratio  . Comprehensive metabolic panel  . Hemoglobin A1c

## 2016-01-09 NOTE — Assessment & Plan Note (Signed)
Limit PPI, limit triggers

## 2016-01-10 ENCOUNTER — Encounter: Payer: Self-pay | Admitting: Physical Therapy

## 2016-01-10 ENCOUNTER — Ambulatory Visit: Payer: BC Managed Care – PPO | Admitting: Physical Therapy

## 2016-01-10 ENCOUNTER — Other Ambulatory Visit: Payer: Self-pay | Admitting: Family Medicine

## 2016-01-10 DIAGNOSIS — M6281 Muscle weakness (generalized): Secondary | ICD-10-CM

## 2016-01-10 DIAGNOSIS — M545 Low back pain, unspecified: Secondary | ICD-10-CM

## 2016-01-10 DIAGNOSIS — R262 Difficulty in walking, not elsewhere classified: Secondary | ICD-10-CM

## 2016-01-10 LAB — COMPREHENSIVE METABOLIC PANEL
A/G RATIO: 2.1 (ref 1.2–2.2)
ALBUMIN: 4.7 g/dL (ref 3.5–5.5)
ALT: 21 IU/L (ref 0–32)
AST: 18 IU/L (ref 0–40)
Alkaline Phosphatase: 87 IU/L (ref 39–117)
BILIRUBIN TOTAL: 0.3 mg/dL (ref 0.0–1.2)
BUN / CREAT RATIO: 30 — AB (ref 9–23)
BUN: 16 mg/dL (ref 6–20)
CALCIUM: 9.4 mg/dL (ref 8.7–10.2)
CHLORIDE: 98 mmol/L (ref 96–106)
CO2: 27 mmol/L (ref 18–29)
Creatinine, Ser: 0.54 mg/dL — ABNORMAL LOW (ref 0.57–1.00)
GFR, EST AFRICAN AMERICAN: 137 mL/min/{1.73_m2} (ref >59)
GFR, EST NON AFRICAN AMERICAN: 119 mL/min/{1.73_m2} (ref >59)
GLUCOSE: 93 mg/dL (ref 65–99)
Globulin, Total: 2.2 g/dL (ref 1.5–4.5)
Potassium: 4 mmol/L (ref 3.5–5.2)
Sodium: 140 mmol/L (ref 134–144)
TOTAL PROTEIN: 6.9 g/dL (ref 6.0–8.5)

## 2016-01-10 LAB — LIPID PANEL W/O CHOL/HDL RATIO
Cholesterol, Total: 144 mg/dL (ref 100–199)
HDL: 57 mg/dL (ref >39)
LDL Calculated: 79 mg/dL (ref 0–99)
Triglycerides: 39 mg/dL (ref 0–149)
VLDL Cholesterol Cal: 8 mg/dL (ref 5–40)

## 2016-01-10 LAB — HEMOGLOBIN A1C
Est. average glucose Bld gHb Est-mCnc: 126 mg/dL
Hgb A1c MFr Bld: 6 % — ABNORMAL HIGH (ref 4.8–5.6)

## 2016-01-10 NOTE — Therapy (Signed)
[place] MAIN [facility] SERVICES [address] [place], [place], [address] Phone: [phone]   Fax:  [phone]  Physical Therapy Treatment  Patient Details  Name: Carrie Cross MRN: [id number] Date of Birth: 05-Jul-1976 Referring Provider: Kary Kos MD  Encounter Date: 01/10/2016      PT End of Session - 01/10/16 1025    Visit Number 9   Number of Visits 13   Date for PT Re-Evaluation 01/24/16   PT Start Time 0845   PT Stop Time 0930   PT Time Calculation (min) 45 min   Activity Tolerance Patient tolerated treatment well;No increased pain   Behavior During Therapy [facility] for tasks assessed/performed      Past Medical History  Diagnosis Date  . Hypertension     Controlled per pt  . Hyperlipidemia   . GERD (gastroesophageal reflux disease)   . Allergic rhinitis   . Irregular menstruation   . Carpal tunnel syndrome   . IFG (impaired fasting glucose)   . Chronic migraine   . Hx of pyelonephritis     as a child    Past Surgical History  Procedure Laterality Date  . Back surgery  [id number]    disc shaved down    There were no vitals filed for this visit.      Subjective Assessment - 01/10/16 0847    Subjective Patient reports doing better. She is having less back pain this morning.    Pertinent History personal factors affecting rehab: chronic back pain, fear of re-injury   How long can you sit comfortably? has to re-adjust but doesn't feel any RLE pain;    How long can you stand comfortably? longest stood is 5-10 min with no pain; but hasn't tried standing long time;    How long can you walk comfortably? limited to <20 min due to increased back pain;    Diagnostic tests MRI showed L5-S1 severe disc bulge prior to surgery; no recent images;    Patient Stated Goals get back to PLOF; get back to walking as before; being able to bend down towards floor, squatting;    Currently in Pain? No/denies   Pain Onset 1 to 4 weeks ago         TREATMENT: Gait on treadmill 2.0 mph x3 min (Unbilled);  Patient instructed in repetitive lifting: Waist<>floor 20# box x5 reps with cues to increase base of support for less knee discomfort; Waist to waist (180 degree turns) 20# box x10 reps with cues for pivoting on feet for less discomfort; Pushing cart with 30# x100 feet, with 55# x100 feet with min VCs to increase core stabilization while pushing cart especially with turns;  Patient reports increased fatigue and slight back discomfort with turning and repetitive lifting; She rates right low back pain at 3/10 following lifting tasks;  PT applied lumbar mechanical traction in prone, 20 sec pull, 10 sec rest, 90# pull, 60# rest force x20 min;  Patient reports no pain along right side of low back while the machine was pulling. Following traction she continues to have mild soreness of 1/10 along right low back, but has no hip pain;                          PT Education - 01/10/16 1025    Education provided Yes   Education Details traction, ROM   Person(s) Educated Patient   Methods Explanation;Verbal cues   Comprehension Returned demonstration;Verbalized  understanding;Verbal cues required             PT Long Term Goals - 01/08/16 0911    PT LONG TERM GOAL #1   Title Patient will be independent in home exercise program to improve strength/mobility for better functional independence with ADLs. by 01/24/16   Time 6   Period Weeks   Status On-going   PT LONG TERM GOAL #2   Title Patient will have a decrease in back pain to 2/10 or less at worst to improve ability to tolerate standing and walking for longer durations for functional mobility by 01/24/16   Time 6   Period Weeks   Status Partially Met   PT LONG TERM GOAL #3   Title Patient will improve her Modified Oswestry Low Back Pain Disability Questionnaire to <20% as a demonstration of improved function. by 01/24/16   Time 6   Period Weeks    Status Achieved   PT LONG TERM GOAL #4   Title Patient (< 61 years old) will complete five times sit to stand test in < 10 seconds indicating an increased LE strength and improved balance. by 01/24/16   Time 6   Period Weeks   Status On-going   PT LONG TERM GOAL #5   Title Patient will increase BLE gross strength to 4+/5 as to improve functional strength for independent gait, increased standing tolerance and increased ADL ability. by 01/24/16   Time 6   Period Weeks   Status On-going   PT LONG TERM GOAL #6   Title Patient will increase lower extremity functional scale to >60/80 to demonstrate improved functional mobility and increased tolerance with ADLs.  by 02/24/16   Time 6   Period Weeks   Status Achieved               Plan - 01/10/16 1025    Clinical Impression Statement Instructed patient in repetitive lifting and pushing weight for increased strengthening and education on lifting mechanics. She required min VCs for correct positioning and to increase pivot turn with transferring weight. She reports slight increase in back pain but denies any LE pain. She responds well to traction with less back pain during treatment. Patient would benefit from additional skilled PT Intervention to improve lifting technique and return to PLOF.   Rehab Potential Good   Clinical Impairments Affecting Rehab Potential positive: no radicular symptoms now, good caregiver support; negative: chronicity of pain; Patient's clinical presentation is stable as she is young with minimal co-morbidities and her pain is localized to her low back;    PT Frequency 2x / week   PT Duration 6 weeks   PT Treatment/Interventions Aquatic Therapy;Cryotherapy;Electrical Stimulation;Moist Heat;Traction;Therapeutic exercise;Therapeutic activities;Functional mobility training;Stair training;Gait training;Ultrasound;Patient/family education;Manual techniques;Energy conservation;Dry needling   PT Next Visit Plan advance HEP,  TENs, work on lumbar ROM   PT Home Exercise Plan continue as previously given;    Consulted and Agree with Plan of Care Patient      Patient will benefit from skilled therapeutic intervention in order to improve the following deficits and impairments:  Hypomobility, Decreased activity tolerance, Decreased strength, Pain, Difficulty walking, Decreased mobility, Decreased range of motion, Improper body mechanics, Impaired flexibility  Visit Diagnosis: Right-sided low back pain without sciatica  Muscle weakness (generalized)  Difficulty in walking, not elsewhere classified     Problem List Patient Active Problem List   Diagnosis Date Noted  . Herpes simplex 08/10/2015  . Genital sore 08/01/2015  . Abnormal mammogram of left  breast 07/31/2015  . Medication monitoring encounter 07/23/2015  . Eczema 05/11/2015  . Subacute right lumbar radiculopathy 05/11/2015  . Overweight (BMI 25.0-29.9) 05/11/2015  . Hypertension   . Hyperlipidemia   . GERD (gastroesophageal reflux disease)   . Allergic rhinitis   . Irregular menstruation   . Carpal tunnel syndrome   . IFG (impaired fasting glucose)   . Chronic migraine     Trotter,Margaret PT, DPT 01/10/2016, 10:27 AM  [place] MAIN [facility] SERVICES [address] [place], [place], [address] Phone: [phone]   Fax:  [phone]  Name: Carrie Cross MRN: [id number] Date of Birth: Dec 08, 1975

## 2016-01-10 NOTE — Telephone Encounter (Signed)
May 10th labs reviewed; Rx approved

## 2016-01-15 ENCOUNTER — Encounter: Payer: Self-pay | Admitting: Physical Therapy

## 2016-01-15 ENCOUNTER — Ambulatory Visit: Payer: BC Managed Care – PPO | Admitting: Physical Therapy

## 2016-01-15 DIAGNOSIS — M6281 Muscle weakness (generalized): Secondary | ICD-10-CM

## 2016-01-15 DIAGNOSIS — M545 Low back pain, unspecified: Secondary | ICD-10-CM

## 2016-01-15 DIAGNOSIS — R262 Difficulty in walking, not elsewhere classified: Secondary | ICD-10-CM

## 2016-01-15 NOTE — Therapy (Signed)
[place] MAIN [facility] SERVICES [address] [place], [place], [address] Phone: [phone]   Fax:  [phone]  Physical Therapy Treatment  Patient Details  Name: Carrie Cross MRN: [id number] Date of Birth: Mar 31, 1976 Referring Provider: Kary Kos MD  Encounter Date: 01/15/2016      PT End of Session - 01/15/16 1117    Visit Number 10   Number of Visits 13   Date for PT Re-Evaluation 01/24/16   PT Start Time 0802   PT Stop Time 0845   PT Time Calculation (min) 43 min   Activity Tolerance Patient tolerated treatment well;No increased pain   Behavior During Therapy [facility] for tasks assessed/performed      Past Medical History  Diagnosis Date  . Hypertension     Controlled per pt  . Hyperlipidemia   . GERD (gastroesophageal reflux disease)   . Allergic rhinitis   . Irregular menstruation   . Carpal tunnel syndrome   . IFG (impaired fasting glucose)   . Chronic migraine   . Hx of pyelonephritis     as a child    Past Surgical History  Procedure Laterality Date  . Back surgery  [id number]    disc shaved down    There were no vitals filed for this visit.      Subjective Assessment - 01/15/16 0839    Subjective Patient reports having a little soreness after last session; She reports that by the end of the day her back was fine. She denies any pain this morning.    Pertinent History personal factors affecting rehab: chronic back pain, fear of re-injury   How long can you sit comfortably? has to re-adjust but doesn't feel any RLE pain;    How long can you stand comfortably? longest stood is 5-10 min with no pain; but hasn't tried standing long time;    How long can you walk comfortably? limited to <20 min due to increased back pain;    Diagnostic tests MRI showed L5-S1 severe disc bulge prior to surgery; no recent images;    Patient Stated Goals get back to PLOF; get back to walking as before; being able to bend down towards  floor, squatting;    Currently in Pain? No/denies   Pain Onset 1 to 4 weeks ago         TREATMENT: Gait on treadmill 2.0 mph x3 min (Unbilled);  Patient instructed in repetitive lifting: Red tray with 10#  Lift from middle and lower shelf on the cart to table waist height x5 each with cues to keep tray close to stomach and to avoid slumping when going from full squat to standing;  Waist to waist (180 degree turns) 20# box x5 reps with cues for pivoting on feet for less discomfort; Pushing cart with 60# +  x100 feet x2 on linoleum, x150 feet on carpet with min VCs to increase core stabilization while pushing cart especially with turns; Carrying box with 20# x100 feet with cues to relax shoulders and to increase core stabilization with better erect posture;  Prone: Press up on hands x10 reps for increased lumbar extension stretch; Alternate UE/LE lift 2# in UE 2x10 with cues to avoid trunk rotation for better core stabilization and better lumbar extension;  Patient reports a little discomfort in right side of low back with advanced lifting and with prone exercise.  PT applied lumbar mechanical traction in prone, 20 sec pull, 10 sec rest, 90# pull, 60# rest force  x20 min;  Patient reports no pain after traction.                             PT Education - 01/15/16 1117    Education provided Yes   Education Details lifting mechanics, traction   Person(s) Educated Patient   Methods Explanation;Verbal cues   Comprehension Verbalized understanding;Returned demonstration;Verbal cues required             PT Long Term Goals - 01/08/16 0911    PT LONG TERM GOAL #1   Title Patient will be independent in home exercise program to improve strength/mobility for better functional independence with ADLs. by 01/24/16   Time 6   Period Weeks   Status On-going   PT LONG TERM GOAL #2   Title Patient will have a decrease in back pain to 2/10 or less at worst to  improve ability to tolerate standing and walking for longer durations for functional mobility by 01/24/16   Time 6   Period Weeks   Status Partially Met   PT LONG TERM GOAL #3   Title Patient will improve her Modified Oswestry Low Back Pain Disability Questionnaire to <20% as a demonstration of improved function. by 01/24/16   Time 6   Period Weeks   Status Achieved   PT LONG TERM GOAL #4   Title Patient (< 42 years old) will complete five times sit to stand test in < 10 seconds indicating an increased LE strength and improved balance. by 01/24/16   Time 6   Period Weeks   Status On-going   PT LONG TERM GOAL #5   Title Patient will increase BLE gross strength to 4+/5 as to improve functional strength for independent gait, increased standing tolerance and increased ADL ability. by 01/24/16   Time 6   Period Weeks   Status On-going   PT LONG TERM GOAL #6   Title Patient will increase lower extremity functional scale to >60/80 to demonstrate improved functional mobility and increased tolerance with ADLs.  by 02/24/16   Time 6   Period Weeks   Status Achieved               Plan - 01/15/16 1117    Clinical Impression Statement Instructed patient in repetitive lifting with additional lifting from lower shelf to waist height to simulate job tasks. 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 However she does continue to require min-mod VCs for correct lifting technique and body mechanics. She reports most pain with getting objects from lower level and moving to higher level.She continues to respond well with traction. She would benefit from additional skilled PT Intervention to improve lumbar/core strength and reduce difficulty with lifting tasks for better return to PLOF.    Rehab Potential Good   Clinical Impairments Affecting Rehab Potential positive: no radicular symptoms now, good caregiver support; negative: chronicity of pain; Patient's clinical presentation  is stable as she is young with minimal co-morbidities and her pain is localized to her low back;    PT Frequency 2x / week   PT Duration 6 weeks   PT Treatment/Interventions Aquatic Therapy;Cryotherapy;Electrical Stimulation;Moist Heat;Traction;Therapeutic exercise;Therapeutic activities;Functional mobility training;Stair training;Gait training;Ultrasound;Patient/family education;Manual techniques;Energy conservation;Dry needling   PT Next Visit Plan advance HEP, TENs, work on lumbar ROM   PT Home Exercise Plan continue as previously given;    Consulted and Agree with Plan of Care Patient  Patient will benefit from skilled therapeutic intervention in order to improve the following deficits and impairments:  Hypomobility, Decreased activity tolerance, Decreased strength, Pain, Difficulty walking, Decreased mobility, Decreased range of motion, Improper body mechanics, Impaired flexibility  Visit Diagnosis: Right-sided low back pain without sciatica  Muscle weakness (generalized)  Difficulty in walking, not elsewhere classified     Problem List Patient Active Problem List   Diagnosis Date Noted  . Herpes simplex 08/10/2015  . Genital sore 08/01/2015  . Abnormal mammogram of left breast 07/31/2015  . Medication monitoring encounter 07/23/2015  . Eczema 05/11/2015  . Subacute right lumbar radiculopathy 05/11/2015  . Overweight (BMI 25.0-29.9) 05/11/2015  . Hypertension   . Hyperlipidemia   . GERD (gastroesophageal reflux disease)   . Allergic rhinitis   . Irregular menstruation   . Carpal tunnel syndrome   . IFG (impaired fasting glucose)   . Chronic migraine     Janautica Netzley  PT, DPT  01/15/2016, 11:20 AM  [place] MAIN [facility] SERVICES [address] [place], [place], [address] Phone: [phone]   Fax:  [phone]  Name: Carrie Cross MRN: [id number] Date of Birth: [birth year]

## 2016-01-17 ENCOUNTER — Encounter: Payer: Self-pay | Admitting: Physical Therapy

## 2016-01-17 ENCOUNTER — Ambulatory Visit: Payer: BC Managed Care – PPO | Admitting: Physical Therapy

## 2016-01-17 DIAGNOSIS — M545 Low back pain, unspecified: Secondary | ICD-10-CM

## 2016-01-17 DIAGNOSIS — M6281 Muscle weakness (generalized): Secondary | ICD-10-CM

## 2016-01-17 DIAGNOSIS — R262 Difficulty in walking, not elsewhere classified: Secondary | ICD-10-CM

## 2016-01-17 NOTE — Therapy (Signed)
[place] MAIN [facility] SERVICES [address] [place], [place], [address] Phone: [phone]   Fax:  [phone]  Physical Therapy Treatment  Patient Details  Name: Carrie Cross MRN: [id number] Date of Birth: [birth year] Referring Provider: Kary Kos MD  Encounter Date: 01/17/2016      PT End of Session - 01/17/16 1252    Visit Number 11   Number of Visits 13   Date for PT Re-Evaluation 01/24/16   PT Start Time 1030   PT Stop Time 1115   PT Time Calculation (min) 45 min   Activity Tolerance Patient tolerated treatment well;No increased pain   Behavior During Therapy [facility] for tasks assessed/performed      Past Medical History  Diagnosis Date  . Hypertension     Controlled per pt  . Hyperlipidemia   . GERD (gastroesophageal reflux disease)   . Allergic rhinitis   . Irregular menstruation   . Carpal tunnel syndrome   . IFG (impaired fasting glucose)   . Chronic migraine   . Hx of pyelonephritis     as a child    Past Surgical History  Procedure Laterality Date  . Back surgery  [id number]    disc shaved down    There were no vitals filed for this visit.      Subjective Assessment - 01/17/16 1242    Subjective Patient reports doing well this morning. She denies any pain currently.   Pertinent History personal factors affecting rehab: chronic back pain, fear of re-injury   How long can you sit comfortably? has to re-adjust but doesn't feel any RLE pain;    How long can you stand comfortably? longest stood is 5-10 min with no pain; but hasn't tried standing long time;    How long can you walk comfortably? limited to <20 min due to increased back pain;    Diagnostic tests MRI showed L5-S1 severe disc bulge prior to surgery; no recent images;    Patient Stated Goals get back to PLOF; get back to walking as before; being able to bend down towards floor, squatting;    Currently in Pain? No/denies   Pain Onset 1 to 4 weeks ago        TREATMENT: Gait on treadmill 2.0 mph x3 min (Unbilled);  Patient instructed in repetitive lifting: Red tray with 20# Lift from middle shelf on the cart to waist and shoulder height x5 each with cues to keep tray close to stomach and to avoid slumping when going from full squat to standing; Waist to waist (180 degree turns) 20# box x5 reps with cues for pivoting on feet for less discomfort; Floor to shoulder height 20# x2 reps; Waist to shoulder height with big box 20# x2 reps with cues for weight shifting forward to reduce excessive lumbar extension; Pushing cart with 80# + x200 feet x2 on linoleum, with min VCs to increase core stabilization while pushing cart especially with turns;  Patient reports having increased pain when wearing her jeans and belt. PT believes that the pressure of the belt/waist of pants could be causing pressure along right side of low back where the disc protrusion was. Recommended patient wear either higher waisted jeans or looser clothing to reduce pain;   Finished with TENs to right lumbar paraspinals concurrent with cryotherapy x20 min in prone; Patient reports no pain after treatment session;  PT Education - 01/17/16 1252    Education provided Yes   Education Details lifting techniques,    Person(s) Educated Patient   Methods Explanation;Verbal cues   Comprehension Returned demonstration;Verbalized understanding;Verbal cues required             PT Long Term Goals - 01/08/16 0911    PT LONG TERM GOAL #1   Title Patient will be independent in home exercise program to improve strength/mobility for better functional independence with ADLs. by 01/24/16   Time 6   Period Weeks   Status On-going   PT LONG TERM GOAL #2   Title Patient will have a decrease in back pain to 2/10 or less at worst to improve ability to tolerate standing and walking for longer durations for functional mobility by 01/24/16    Time 6   Period Weeks   Status Partially Met   PT LONG TERM GOAL #3   Title Patient will improve her Modified Oswestry Low Back Pain Disability Questionnaire to <20% as a demonstration of improved function. by 01/24/16   Time 6   Period Weeks   Status Achieved   PT LONG TERM GOAL #4   Title Patient (< 2 years old) will complete five times sit to stand test in < 10 seconds indicating an increased LE strength and improved balance. by 01/24/16   Time 6   Period Weeks   Status On-going   PT LONG TERM GOAL #5   Title Patient will increase BLE gross strength to 4+/5 as to improve functional strength for independent gait, increased standing tolerance and increased ADL ability. by 01/24/16   Time 6   Period Weeks   Status On-going   PT LONG TERM GOAL #6   Title Patient will increase lower extremity functional scale to >60/80 to demonstrate improved functional mobility and increased tolerance with ADLs.  by 02/24/16   Time 6   Period Weeks   Status Achieved               Plan - 01/17/16 1253    Clinical Impression Statement Instructed patient in repetitive lifting; Patient continues to require cues for better positioning with overhead lift and turns. Patient reports less pain today; She responds well to TENs with no pain after treatment session. Patient would benefit from additional skilled PT intervention to return to PLOF.    Rehab Potential Good   Clinical Impairments Affecting Rehab Potential positive: no radicular symptoms now, good caregiver support; negative: chronicity of pain; Patient's clinical presentation is stable as she is young with minimal co-morbidities and her pain is localized to her low back;    PT Frequency 2x / week   PT Duration 6 weeks   PT Treatment/Interventions Aquatic Therapy;Cryotherapy;Electrical Stimulation;Moist Heat;Traction;Therapeutic exercise;Therapeutic activities;Functional mobility training;Stair training;Gait training;Ultrasound;Patient/family  education;Manual techniques;Energy conservation;Dry needling   PT Next Visit Plan advance HEP, TENs, work on lumbar ROM   PT Home Exercise Plan continue as previously given;    Consulted and Agree with Plan of Care Patient      Patient will benefit from skilled therapeutic intervention in order to improve the following deficits and impairments:  Hypomobility, Decreased activity tolerance, Decreased strength, Pain, Difficulty walking, Decreased mobility, Decreased range of motion, Improper body mechanics, Impaired flexibility  Visit Diagnosis: Right-sided low back pain without sciatica  Muscle weakness (generalized)  Difficulty in walking, not elsewhere classified     Problem List Patient Active Problem List   Diagnosis Date Noted  . Herpes simplex 08/10/2015  .  Genital sore 08/01/2015  . Abnormal mammogram of left breast 07/31/2015  . Medication monitoring encounter 07/23/2015  . Eczema 05/11/2015  . Subacute right lumbar radiculopathy 05/11/2015  . Overweight (BMI 25.0-29.9) 05/11/2015  . Hypertension   . Hyperlipidemia   . GERD (gastroesophageal reflux disease)   . Allergic rhinitis   . Irregular menstruation   . Carpal tunnel syndrome   . IFG (impaired fasting glucose)   . Chronic migraine     Zuleyma Scharf PT, DPT 01/17/2016, 1:04 PM  [place] MAIN [facility] SERVICES [address] [place], [place], [address] Phone: [phone]   Fax:  [phone]  Name: Carrie Cross MRN: [id number] Date of Birth: 1975-11-17

## 2016-01-22 ENCOUNTER — Encounter: Payer: Self-pay | Admitting: Physical Therapy

## 2016-01-22 ENCOUNTER — Ambulatory Visit: Payer: BC Managed Care – PPO | Admitting: Physical Therapy

## 2016-01-22 DIAGNOSIS — R262 Difficulty in walking, not elsewhere classified: Secondary | ICD-10-CM

## 2016-01-22 DIAGNOSIS — M545 Low back pain, unspecified: Secondary | ICD-10-CM

## 2016-01-22 DIAGNOSIS — M6281 Muscle weakness (generalized): Secondary | ICD-10-CM

## 2016-01-22 NOTE — Therapy (Signed)
[place] MAIN [facility] SERVICES [address] [place], [place], [address] Phone: [phone]   Fax:  [phone]  Physical Therapy Treatment  Patient Details  Name: Carrie Cross MRN: [id number] Date of Birth: 1976/04/25 Referring Provider: Kary Kos MD  Encounter Date: 01/22/2016      PT End of Session - 01/22/16 0933    Visit Number 12   Number of Visits 13   Date for PT Re-Evaluation 01/24/16   PT Start Time 0802   PT Stop Time 0845   PT Time Calculation (min) 43 min   Activity Tolerance Patient tolerated treatment well;No increased pain   Behavior During Therapy [facility] for tasks assessed/performed      Past Medical History  Diagnosis Date  . Hypertension     Controlled per pt  . Hyperlipidemia   . GERD (gastroesophageal reflux disease)   . Allergic rhinitis   . Irregular menstruation   . Carpal tunnel syndrome   . IFG (impaired fasting glucose)   . Chronic migraine   . Hx of pyelonephritis     as a child    Past Surgical History  Procedure Laterality Date  . Back surgery  [id number]    disc shaved down    There were no vitals filed for this visit.      Subjective Assessment - 01/22/16 0811    Subjective "I have a little soreness in my low back but that could be because of the weather. The rain sometimes bothers my back."    Pertinent History personal factors affecting rehab: chronic back pain, fear of re-injury   How long can you sit comfortably? has to re-adjust but doesn't feel any RLE pain;    How long can you stand comfortably? longest stood is 5-10 min with no pain; but hasn't tried standing long time;    How long can you walk comfortably? limited to <20 min due to increased back pain;    Diagnostic tests MRI showed L5-S1 severe disc bulge prior to surgery; no recent images;    Patient Stated Goals get back to PLOF; get back to walking as before; being able to bend down towards floor, squatting;    Currently in  Pain? Yes   Pain Score 2    Pain Location Back   Pain Orientation Right;Lower   Pain Descriptors / Indicators Aching;Sore   Pain Type Chronic pain   Pain Onset 1 to 4 weeks ago         TREATMENT: Gait on treadmill 2.5 mph x3 min (Unbilled);  Patient instructed in repetitive lifting: Red tray with 20# Lift from middle shelf on the cart to waist x5 with cues to keep tray close to stomach and to avoid slumping when going from full squat to standing; Floor to waist height 20# x5 reps; Red tray, carrying 20# x100 feet with cues to keep tray close to self and reduce lumbar extension with prolonged carry; Pushing cart with 80# + x200 feet x2 on linoleum, with min VCs to increase core stabilization while pushing cart especially with turns;  Prone: Press up on hands 2x10 with min VCs to relax hips at end range; Alternate knee flexion x10 Alternate hip extension x10  PT performed grade II-III PA mobs along lumbar spine at each level L1, L2, L3, L4, L5, S1 10 sec bouts x2 sets with cues to relax during manual therapy for better joint mobs; Patient reports increased discomfort along L3, L4, L5 levels;   Finished  with TENs to right lumbar paraspinals concurrent with cryotherapy x15 min in prone; Patient reports no pain after treatment session;                                PT Education - 01/22/16 0933    Education provided Yes   Education Details lifting mechanics, exercise   Person(s) Educated Patient   Methods Explanation;Verbal cues   Comprehension Verbalized understanding;Returned demonstration;Verbal cues required             PT Long Term Goals - 01/08/16 0911    PT LONG TERM GOAL #1   Title Patient will be independent in home exercise program to improve strength/mobility for better functional independence with ADLs. by 01/24/16   Time 6   Period Weeks   Status On-going   PT LONG TERM GOAL #2   Title Patient will have a decrease in back pain to  2/10 or less at worst to improve ability to tolerate standing and walking for longer durations for functional mobility by 01/24/16   Time 6   Period Weeks   Status Partially Met   PT LONG TERM GOAL #3   Title Patient will improve her Modified Oswestry Low Back Pain Disability Questionnaire to <20% as a demonstration of improved function. by 01/24/16   Time 6   Period Weeks   Status Achieved   PT LONG TERM GOAL #4   Title Patient (< 52 years old) will complete five times sit to stand test in < 10 seconds indicating an increased LE strength and improved balance. by 01/24/16   Time 6   Period Weeks   Status On-going   PT LONG TERM GOAL #5   Title Patient will increase BLE gross strength to 4+/5 as to improve functional strength for independent gait, increased standing tolerance and increased ADL ability. by 01/24/16   Time 6   Period Weeks   Status On-going   PT LONG TERM GOAL #6   Title Patient will increase lower extremity functional scale to >60/80 to demonstrate improved functional mobility and increased tolerance with ADLs.  by 02/24/16   Time 6   Period Weeks   Status Achieved               Plan - 01/22/16 0934    Clinical Impression Statement Patient reports increased soreness today; She had increased tenderness with joint mobs along lumbar spine. She responded well to TENs and prone press-ups with less pain after treatment session. patient required min VCs for correct lifting techniques. She is getting stronger as she has less difficulty with lifting. Patient would benefit from additional skilled PT intervention to reduce back pain and return to PLOF.    Rehab Potential Good   Clinical Impairments Affecting Rehab Potential positive: no radicular symptoms now, good caregiver support; negative: chronicity of pain; Patient's clinical presentation is stable as she is young with minimal co-morbidities and her pain is localized to her low back;    PT Frequency 2x / week   PT Duration  6 weeks   PT Treatment/Interventions Aquatic Therapy;Cryotherapy;Electrical Stimulation;Moist Heat;Traction;Therapeutic exercise;Therapeutic activities;Functional mobility training;Stair training;Gait training;Ultrasound;Patient/family education;Manual techniques;Energy conservation;Dry needling   PT Next Visit Plan advance HEP, TENs, work on lumbar ROM   PT Home Exercise Plan continue as previously given;    Consulted and Agree with Plan of Care Patient      Patient will benefit from skilled therapeutic intervention in order  to improve the following deficits and impairments:  Hypomobility, Decreased activity tolerance, Decreased strength, Pain, Difficulty walking, Decreased mobility, Decreased range of motion, Improper body mechanics, Impaired flexibility  Visit Diagnosis: Right-sided low back pain without sciatica  Muscle weakness (generalized)  Difficulty in walking, not elsewhere classified     Problem List Patient Active Problem List   Diagnosis Date Noted  . Herpes simplex 08/10/2015  . Genital sore 08/01/2015  . Abnormal mammogram of left breast 07/31/2015  . Medication monitoring encounter 07/23/2015  . Eczema 05/11/2015  . Subacute right lumbar radiculopathy 05/11/2015  . Overweight (BMI 25.0-29.9) 05/11/2015  . Hypertension   . Hyperlipidemia   . GERD (gastroesophageal reflux disease)   . Allergic rhinitis   . Irregular menstruation   . Carpal tunnel syndrome   . IFG (impaired fasting glucose)   . Chronic migraine     Demian Maisel  PT, DPT  01/22/2016, 9:35 AM  [place] MAIN [facility] SERVICES [address] [place], [place], [address] Phone: [phone]   Fax:  [phone]  Name: Carrie Cross MRN: [id number] Date of Birth: [birth year]

## 2016-01-24 ENCOUNTER — Ambulatory Visit: Payer: BC Managed Care – PPO | Admitting: Physical Therapy

## 2016-01-30 ENCOUNTER — Ambulatory Visit: Payer: BC Managed Care – PPO | Admitting: Physical Therapy

## 2016-01-30 ENCOUNTER — Ambulatory Visit: Payer: BC Managed Care – PPO | Admitting: Family Medicine

## 2016-01-30 ENCOUNTER — Encounter: Payer: Self-pay | Admitting: Physical Therapy

## 2016-01-30 DIAGNOSIS — M545 Low back pain, unspecified: Secondary | ICD-10-CM

## 2016-01-30 DIAGNOSIS — R262 Difficulty in walking, not elsewhere classified: Secondary | ICD-10-CM

## 2016-01-30 DIAGNOSIS — M6281 Muscle weakness (generalized): Secondary | ICD-10-CM

## 2016-01-30 NOTE — Therapy (Signed)
[place] MAIN [facility] SERVICES [address] [place], [place], [address] Phone: [phone]   Fax:  [phone]  Physical Therapy Treatment/Discharge Summary  Patient Details  Name: Carrie Cross MRN: [id number] Date of Birth: 07-Aug-1976 Referring Provider: Kary Kos MD  Encounter Date: 01/30/2016      PT End of Session - 01/30/16 1618    Visit Number 13   Number of Visits 13   Date for PT Re-Evaluation 02/06/16   PT Start Time 2330   PT Stop Time 1515   PT Time Calculation (min) 44 min   Activity Tolerance Patient tolerated treatment well;No increased pain   Behavior During Therapy [facility] for tasks assessed/performed      Past Medical History  Diagnosis Date  . Hypertension     Controlled per pt  . Hyperlipidemia   . GERD (gastroesophageal reflux disease)   . Allergic rhinitis   . Irregular menstruation   . Carpal tunnel syndrome   . IFG (impaired fasting glucose)   . Chronic migraine   . Hx of pyelonephritis     as a child    Past Surgical History  Procedure Laterality Date  . Back surgery  [id number]    disc shaved down    There were no vitals filed for this visit.      Subjective Assessment - 01/30/16 1438    Subjective "I have been walking at the gym to get into shape and I think that I might be overdoing it. " She reports 5/10 low back pain along right lumbar spine however no radicular pain; She reports not doing prone exercise after gym;    Pertinent History personal factors affecting rehab: chronic back pain, fear of re-injury   How long can you sit comfortably? has to re-adjust but doesn't feel any RLE pain;    How long can you stand comfortably? longest stood is 5-10 min with no pain; but hasn't tried standing long time;    How long can you walk comfortably? limited to <20 min due to increased back pain;    Diagnostic tests MRI showed L5-S1 severe disc bulge prior to surgery; no recent images;    Patient Stated  Goals get back to PLOF; get back to walking as before; being able to bend down towards floor, squatting;    Currently in Pain? Yes   Pain Score 2    Pain Location Back   Pain Orientation Right;Lower   Pain Descriptors / Indicators Aching;Sore   Pain Onset 1 to 4 weeks ago            [facility] PT Assessment - 01/30/16 0001    Observation/Other Assessments   Modified Oswertry 4% (minimal disability) improved from 12/13/15 which was 36%   Standardized Balance Assessment   Five times sit to stand comments  10 sec (low fall risk) improved from eval on 12/13/15 which was 13.8 sec   10 Meter Walk 1.33 m/s low fall risk; community ambulator;         TREATMENT: Patient instructed in repetitive lifting: Floor to waist height 30# x5 reps; she demonstrates no increase in back pain and is able to demonstrate good posture and techniques; Red tray, carrying 20# x100 feet with cues to keep tray close to self and reduce lumbar extension with prolonged carry; Pushing cart with 80# + x200 feet x2 on linoleum, with min VCs to increase core stabilization while pushing cart especially with turns;  Prone: Press up on hands 2x10  with min VCs to relax hips at end range; Alternate knee flexion x10 Alternate hip extension x10  Finished with TENs to right lumbar paraspinals concurrent with cryotherapy x15 min in prone; Patient reports no pain after treatment session;   Instructed patient in 10 meter walk, 5 times sit<>stand and modified oswestry as way to assess goals. Please see above;    Patient is independent in lifting 30-35# floor to waist without pain with good mechanics; Independent in carrying 20# >100 feet and pushing cart with 85+# x200 feet without increase in pain                        PT Education - 01/30/16 1618    Education provided Yes   Education Details lifting, strengthening, posture, mechanics   Person(s) Educated Patient   Methods Explanation;Verbal cues    Comprehension Verbalized understanding;Returned demonstration;Verbal cues required             PT Long Term Goals - 01/30/16 1448    PT LONG TERM GOAL #1   Title Patient will be independent in home exercise program to improve strength/mobility for better functional independence with ADLs. by 01/24/16   Time 6   Period Weeks   Status Achieved   PT LONG TERM GOAL #2   Title Patient will have a decrease in back pain to 2/10 or less at worst to improve ability to tolerate standing and walking for longer durations for functional mobility by 01/24/16   Time 6   Period Weeks   Status Partially Met   PT LONG TERM GOAL #3   Title Patient will improve her Modified Oswestry Low Back Pain Disability Questionnaire to <20% as a demonstration of improved function. by 01/24/16   Time 6   Period Weeks   Status Achieved   PT LONG TERM GOAL #4   Title Patient (< 70 years old) will complete five times sit to stand test in < 10 seconds indicating an increased LE strength and improved balance. by 01/24/16   Time 6   Period Weeks   Status Achieved   PT LONG TERM GOAL #5   Title Patient will increase BLE gross strength to 4+/5 as to improve functional strength for independent gait, increased standing tolerance and increased ADL ability. by 01/24/16   Time 6   Period Weeks   Status Achieved   PT LONG TERM GOAL #6   Title Patient will increase lower extremity functional scale to >60/80 to demonstrate improved functional mobility and increased tolerance with ADLs.  by 02/24/16   Time 6   Period Weeks   Status Achieved               Plan - 01/30/16 1625    Clinical Impression Statement Instructed patient in advanced lifting techniques and lumbar ROM; Overall patient is independent in all self care ADLs. She is able to repetitively lift 35# floor to waist without difficulty and push 85# cart more than 200 feet without increase in pain. She is able to verbalize and demonstrate independence in correct  body mechanics and lifting techniques; Educated patient in ways to advance HEP and continue working on core stabilization and lumbar strength. she denies any increase in RLE radicular symptoms over last few weeks. Patient has met most goals. she is appropriate for discharge from PT at this time.    Rehab Potential Good   Clinical Impairments Affecting Rehab Potential positive: no radicular symptoms now, good caregiver support; negative:  chronicity of pain; Patient's clinical presentation is stable as she is young with minimal co-morbidities and her pain is localized to her low back;    PT Frequency 1x / week   PT Duration 2 weeks   PT Treatment/Interventions Aquatic Therapy;Cryotherapy;Electrical Stimulation;Moist Heat;Traction;Therapeutic exercise;Therapeutic activities;Functional mobility training;Stair training;Gait training;Ultrasound;Patient/family education;Manual techniques;Energy conservation;Dry needling   PT Next Visit Plan discharge from [place] continue as previously given;    Consulted and Agree with Plan of Care Patient      Patient will benefit from skilled therapeutic intervention in order to improve the following deficits and impairments:  Hypomobility, Decreased activity tolerance, Decreased strength, Pain, Difficulty walking, Decreased mobility, Decreased range of motion, Improper body mechanics, Impaired flexibility  Visit Diagnosis: Right-sided low back pain without sciatica - Plan: PT plan of care cert/re-cert  Muscle weakness (generalized) - Plan: PT plan of care cert/re-cert  Difficulty in walking, not elsewhere classified - Plan: PT plan of care cert/re-cert     Problem List Patient Active Problem List   Diagnosis Date Noted  . Herpes simplex 08/10/2015  . Genital sore 08/01/2015  . Abnormal mammogram of left breast 07/31/2015  . Medication monitoring encounter 07/23/2015  . Eczema 05/11/2015  . Subacute right lumbar radiculopathy 05/11/2015   . Overweight (BMI 25.0-29.9) 05/11/2015  . Hypertension   . Hyperlipidemia   . GERD (gastroesophageal reflux disease)   . Allergic rhinitis   . Irregular menstruation   . Carpal tunnel syndrome   . IFG (impaired fasting glucose)   . Chronic migraine     Ryheem Jay PT, DPT 01/30/2016, 4:33 PM  [place] MAIN [facility] SERVICES [address] [place], [place], [address] Phone: [phone]   Fax:  [phone]  Name: Carrie Cross MRN: [id number] Date of Birth: 1976-04-01

## 2016-02-04 ENCOUNTER — Encounter: Payer: BC Managed Care – PPO | Admitting: Physical Therapy

## 2016-02-06 ENCOUNTER — Encounter: Payer: BC Managed Care – PPO | Admitting: Physical Therapy

## 2016-02-13 ENCOUNTER — Encounter: Payer: BC Managed Care – PPO | Admitting: Physical Therapy

## 2016-02-15 ENCOUNTER — Encounter: Payer: BC Managed Care – PPO | Admitting: Physical Therapy

## 2016-05-17 ENCOUNTER — Encounter: Payer: Self-pay | Admitting: Family Medicine

## 2016-05-20 ENCOUNTER — Encounter: Payer: Self-pay | Admitting: Family Medicine

## 2016-06-23 ENCOUNTER — Encounter: Payer: Self-pay | Admitting: Family Medicine

## 2016-06-23 MED ORDER — METRONIDAZOLE 500 MG PO TABS: 500.0000 mg | ORAL_TABLET | Freq: Two times a day (BID) | ORAL | 0 refills | Status: AC

## 2016-06-23 NOTE — Telephone Encounter (Signed)
Rx sent for possible BV

## 2016-07-02 ENCOUNTER — Other Ambulatory Visit: Payer: Self-pay | Admitting: Family Medicine

## 2016-07-02 NOTE — Telephone Encounter (Signed)
Left detail voicemail regarding approve medications and instructions.

## 2016-07-02 NOTE — Telephone Encounter (Signed)
I'm going to try to wean her off of this This drug has dangerous long-term consequences, so we really need to try to get her off of this Take every OTHER day for the next few weeks, then drop back to just one pill every three days, then stop Avoid triggers Elevate head of bed If something needed, we can switch to Zantac Rx; just call

## 2016-07-10 ENCOUNTER — Encounter: Payer: Self-pay | Admitting: Family Medicine

## 2016-07-10 ENCOUNTER — Ambulatory Visit (INDEPENDENT_AMBULATORY_CARE_PROVIDER_SITE_OTHER): Payer: BC Managed Care – PPO | Admitting: Family Medicine

## 2016-07-10 ENCOUNTER — Ambulatory Visit: Payer: BC Managed Care – PPO | Admitting: Family Medicine

## 2016-07-10 DIAGNOSIS — K219 Gastro-esophageal reflux disease without esophagitis: Secondary | ICD-10-CM | POA: Diagnosis not present

## 2016-07-10 DIAGNOSIS — I10 Essential (primary) hypertension: Secondary | ICD-10-CM | POA: Diagnosis not present

## 2016-07-10 DIAGNOSIS — J309 Allergic rhinitis, unspecified: Secondary | ICD-10-CM | POA: Diagnosis not present

## 2016-07-10 DIAGNOSIS — Z5181 Encounter for therapeutic drug level monitoring: Secondary | ICD-10-CM | POA: Diagnosis not present

## 2016-07-10 DIAGNOSIS — E785 Hyperlipidemia, unspecified: Secondary | ICD-10-CM | POA: Diagnosis not present

## 2016-07-10 DIAGNOSIS — E663 Overweight: Secondary | ICD-10-CM | POA: Diagnosis not present

## 2016-07-10 DIAGNOSIS — R7301 Impaired fasting glucose: Secondary | ICD-10-CM | POA: Diagnosis not present

## 2016-07-10 MED ORDER — RANITIDINE HCL 300 MG PO TABS: 300.0000 mg | ORAL_TABLET | Freq: Every day | ORAL | 3 refills | Status: DC

## 2016-07-10 MED ORDER — RANITIDINE HCL 150 MG PO TABS: 150.0000 mg | ORAL_TABLET | Freq: Two times a day (BID) | ORAL | 11 refills | Status: DC

## 2016-07-10 NOTE — Patient Instructions (Addendum)
Try to use PLAIN allergy medicine without the decongestant Avoid: phenylephrine, phenylpropanolamine, and pseudoephredine  STOP the HCTZ for now Check your blood pressure a few times a week for the next month Your goal blood pressure is less than 140 mmHg on top. Try to follow the DASH guidelines (DASH stands for Dietary Approaches to Stop Hypertension) Try to limit the sodium in your diet.  Ideally, consume less than 1.5 grams (less than 1,500mg ) per day. Do not add salt when cooking or at the table.  Check the sodium amount on labels when shopping, and choose items lower in sodium when given a choice. Avoid or limit foods that already contain a lot of sodium. Eat a diet rich in fruits and vegetables and whole grains.  Caution: prolonged use of proton pump inhibitors like omeprazole (Prilosec), pantoprazole (Protonix), esomeprazole (Nexium), and others like Dexilant and Aciphex may increase your risk of pneumonia, Clostridium difficile colitis, osteoporosis, anemia and other health complications Try to limit or avoid triggers like coffee, caffeinated beverages, onions, chocolate, spicy foods, peppermint, acid foods like pizza, spaghetti sauce, and orange juice Lose weight if you are overweight or obese Try elevating the head of your bed by placing a small wedge between your mattress and box springs to keep acid in the stomach at night instead of coming up into your esophagus  Return tomorrow or next week for fasting labs Okay to use metamucil or other fiber supplement Try to get 64 ounces of water a day to help with constipation

## 2016-07-10 NOTE — Progress Notes (Signed)
BP 104/60   Pulse 89   Temp 99.1 F (37.3 C) (Oral)   Resp 14   Wt 167 lb (75.8 kg)   SpO2 99%   BMI 28.40 kg/m    Subjective:    Patient ID: Carrie Cross, female    DOB: [birth year], 40 y.o.   MRN: [id number]  HPI: Carrie Cross is a 40 y.o. female  Chief Complaint  Patient presents with  . Follow-up   Hypertension; not checking blood pressure away from the doctor; room for improvement with salt reduction;  Taking HCTZ and metoprolol  GERD; trying to wean herself off the omeprazole; she just started this week every other day, then will go to every third day; no known triggers No blood in the stool  Gets the bacterial vaginosis; not douching  Prediabetes 6 months ago; a1c was 6, not eating white bread or rice every day, maybe once a week  Depression screen [facility] 2/9 07/10/2016 01/09/2016  Decreased Interest 0 0  Down, Depressed, Hopeless 0 0  PHQ - 2 Score 0 0   Relevant past medical, surgical, family and social history reviewed Past Medical History:  Diagnosis Date  . Allergic rhinitis   . Carpal tunnel syndrome   . Chronic migraine   . GERD (gastroesophageal reflux disease)   . Hx of pyelonephritis    as a child  . Hyperlipidemia   . Hypertension    Controlled per pt  . IFG (impaired fasting glucose)   . Irregular menstruation    Past Surgical History:  Procedure Laterality Date  . BACK SURGERY  [id number]   disc shaved down   Family History  Problem Relation Age of Onset  . Stroke Neg Hx   . Heart disease Neg Hx   . Diabetes Neg Hx   . Cancer Neg Hx    Social History  Substance Use Topics  . Smoking status: Former Smoker    Packs/day: 0.50    Years: 7.00    Types: Cigarettes    Quit date: 06/02/2011  . Smokeless tobacco: Never Used  . Alcohol use 1.8 oz/week    3 Glasses of wine per week   Interim medical history since last visit reviewed. Allergies and medications reviewed  Review of Systems Per HPI unless specifically indicated  above     Objective:    BP 104/60   Pulse 89   Temp 99.1 F (37.3 C) (Oral)   Resp 14   Wt 167 lb (75.8 kg)   SpO2 99%   BMI 28.40 kg/m   Wt Readings from Last 3 Encounters:  07/10/16 167 lb (75.8 kg)  01/09/16 167 lb (75.8 kg)  08/01/15 166 lb (75.3 kg)    Physical Exam  Constitutional: She appears well-developed and well-nourished. No distress.  HENT:  Head: Normocephalic and atraumatic.  Eyes: EOM are normal. No scleral icterus.  Neck: No thyromegaly present.  Cardiovascular: Normal rate, regular rhythm and normal heart sounds.   No murmur heard. Pulmonary/Chest: Effort normal and breath sounds normal. No respiratory distress. She has no wheezes.  Abdominal: Soft. Bowel sounds are normal. She exhibits no distension.  Musculoskeletal: Normal range of motion. She exhibits no edema.  Neurological: She is alert. She exhibits normal muscle tone.  Skin: Skin is warm and dry. She is not diaphoretic. No pallor.  Psychiatric: She has a normal mood and affect. Her behavior is normal. Judgment and thought content normal.      Assessment & Plan:   Problem  List Items Addressed This Visit      Cardiovascular and Mediastinum   Hypertension    Blood pressure shows excellent control today; stop HCTZ for now; monitor BP and see if control with less medicine; DASH guidelines, weight management        Respiratory   Allergic rhinitis    Avoid decongestants which can raise BP        Digestive   GERD (gastroesophageal reflux disease)    Discussed risks of PPIs; will try less PPI and start H2 blocker; wean off if possible; avoidt riggers      Relevant Medications   ranitidine (ZANTAC) 300 MG tablet     Endocrine   IFG (impaired fasting glucose)    Monitor A1c and glucose eery 6-12 months        Other   Overweight (BMI 25.0-29.9)    Keep working on modest weight loss      Medication monitoring encounter    Watch electrolytes (though I'm stopping the thiazide)       Hyperlipidemia    Check fasting lipids         Follow up plan: Return in about 6 months (around 01/07/2017).  An after-visit summary was printed and given to the patient at [place].  Please see the patient instructions which may contain other information and recommendations beyond what is mentioned above in the assessment and plan.  Meds ordered this encounter  Medications  . DISCONTD: ranitidine (ZANTAC) 150 MG tablet    Sig: Take 1 tablet (150 mg total) by mouth 2 (two) times daily. For heartburn and reflux    Dispense:  60 tablet    Refill:  11  . ranitidine (ZANTAC) 300 MG tablet    Sig: Take 1 tablet (300 mg total) by mouth at bedtime.    Dispense:  90 tablet    Refill:  3

## 2016-07-11 ENCOUNTER — Other Ambulatory Visit: Payer: Self-pay

## 2016-07-11 ENCOUNTER — Telehealth: Payer: Self-pay

## 2016-07-11 DIAGNOSIS — Z5181 Encounter for therapeutic drug level monitoring: Secondary | ICD-10-CM

## 2016-07-11 DIAGNOSIS — I10 Essential (primary) hypertension: Secondary | ICD-10-CM

## 2016-07-11 DIAGNOSIS — E785 Hyperlipidemia, unspecified: Secondary | ICD-10-CM

## 2016-07-11 LAB — COMPLETE METABOLIC PANEL WITH GFR
ALT: 19 U/L (ref 6–29)
AST: 15 U/L (ref 10–30)
Albumin: 4.6 g/dL (ref 3.6–5.1)
Alkaline Phosphatase: 68 U/L (ref 33–115)
BUN: 9 mg/dL (ref 7–25)
CHLORIDE: 102 mmol/L (ref 98–110)
CO2: 21 mmol/L (ref 20–31)
CREATININE: 0.63 mg/dL (ref 0.50–1.10)
Calcium: 10.1 mg/dL (ref 8.6–10.2)
GFR, Est Non African American: >89 mL/min (ref >=60)
GLUCOSE: 94 mg/dL (ref 65–99)
Potassium: 4.2 mmol/L (ref 3.5–5.3)
SODIUM: 139 mmol/L (ref 135–146)
Total Bilirubin: 0.4 mg/dL (ref 0.2–1.2)
Total Protein: 7.7 g/dL (ref 6.1–8.1)

## 2016-07-11 LAB — LIPID PANEL
Cholesterol: 147 mg/dL (ref <200)
HDL: 58 mg/dL (ref >50)
LDL CALC: 80 mg/dL (ref <100)
TRIGLYCERIDES: 45 mg/dL (ref <150)
Total CHOL/HDL Ratio: 2.5 Ratio (ref <5.0)
VLDL: 9 mg/dL (ref <30)

## 2016-07-11 NOTE — Telephone Encounter (Signed)
Pt was told to come back today for fasting labs.  No orders placed in chart so I ordered a Lipid and CMP. Hope this is ok.

## 2016-07-11 NOTE — Telephone Encounter (Signed)
Thank you so much

## 2016-07-14 ENCOUNTER — Ambulatory Visit: Payer: BC Managed Care – PPO | Admitting: Family Medicine

## 2016-07-16 NOTE — Assessment & Plan Note (Signed)
Monitor A1c and glucose eery 6-12 months

## 2016-07-16 NOTE — Assessment & Plan Note (Signed)
Discussed risks of PPIs; will try less PPI and start H2 blocker; wean off if possible; avoidt riggers

## 2016-07-16 NOTE — Assessment & Plan Note (Signed)
Keep working on modest weight loss

## 2016-07-16 NOTE — Assessment & Plan Note (Signed)
Blood pressure shows excellent control today; stop HCTZ for now; monitor BP and see if control with less medicine; DASH guidelines, weight management

## 2016-07-16 NOTE — Assessment & Plan Note (Signed)
Watch electrolytes (though I'm stopping the thiazide)

## 2016-07-16 NOTE — Assessment & Plan Note (Signed)
Avoid decongestants which can raise BP

## 2016-07-16 NOTE — Assessment & Plan Note (Signed)
Check fasting lipids 

## 2016-07-23 ENCOUNTER — Encounter: Payer: Self-pay | Admitting: Family Medicine

## 2016-07-23 MED ORDER — SCOPOLAMINE 1 MG/3DAYS TD PT72: 1.0000 | MEDICATED_PATCH | TRANSDERMAL | 0 refills | Status: DC

## 2016-07-23 NOTE — Telephone Encounter (Signed)
Rx sent to [facility] locally

## 2016-08-18 ENCOUNTER — Encounter: Payer: Self-pay | Admitting: Family Medicine

## 2016-08-19 ENCOUNTER — Other Ambulatory Visit: Payer: Self-pay | Admitting: Family Medicine

## 2016-08-21 ENCOUNTER — Encounter: Payer: Self-pay | Admitting: Family Medicine

## 2016-08-22 MED ORDER — TOPIRAMATE 25 MG PO TABS: ORAL_TABLET | ORAL | 0 refills | Status: DC

## 2016-08-22 MED ORDER — RIZATRIPTAN BENZOATE 10 MG PO TBDP: 10.0000 mg | ORAL_TABLET | ORAL | 1 refills | Status: DC | PRN

## 2016-08-29 ENCOUNTER — Other Ambulatory Visit: Payer: Self-pay | Admitting: Family Medicine

## 2016-08-29 MED ORDER — ATORVASTATIN CALCIUM 20 MG PO TABS: 20.0000 mg | ORAL_TABLET | Freq: Every day | ORAL | 2 refills | Status: DC

## 2016-08-29 NOTE — Telephone Encounter (Signed)
Carrie Cross from [facility] requesting refill on lipitor 20mg .  Reference #: [id number] (P) [phone]

## 2016-08-29 NOTE — Telephone Encounter (Signed)
Nov 2017 labs reviewed; Rx approved

## 2016-09-02 ENCOUNTER — Encounter: Payer: Self-pay | Admitting: Family Medicine

## 2016-09-04 ENCOUNTER — Ambulatory Visit (INDEPENDENT_AMBULATORY_CARE_PROVIDER_SITE_OTHER): Payer: BC Managed Care – PPO | Admitting: Family Medicine

## 2016-09-04 DIAGNOSIS — I10 Essential (primary) hypertension: Secondary | ICD-10-CM

## 2016-09-04 DIAGNOSIS — K219 Gastro-esophageal reflux disease without esophagitis: Secondary | ICD-10-CM | POA: Diagnosis not present

## 2016-09-04 DIAGNOSIS — G4452 New daily persistent headache (NDPH): Secondary | ICD-10-CM | POA: Diagnosis not present

## 2016-09-04 DIAGNOSIS — R519 Headache, unspecified: Secondary | ICD-10-CM | POA: Insufficient documentation

## 2016-09-04 DIAGNOSIS — R51 Headache: Secondary | ICD-10-CM

## 2016-09-04 MED ORDER — BUTALBITAL-ACETAMINOPHEN 50-300 MG PO TABS: ORAL_TABLET | ORAL | 0 refills | Status: DC

## 2016-09-04 MED ORDER — AMOXICILLIN-POT CLAVULANATE 875-125 MG PO TABS: 1.0000 | ORAL_TABLET | Freq: Two times a day (BID) | ORAL | 0 refills | Status: DC

## 2016-09-04 NOTE — Patient Instructions (Addendum)
Try 250 mg of magnesium daily for headache prevention Stop the triptan Stop the night-time medicine Use the butalbital if needed We'll get scans Start the antibiotics Please do eat yogurt daily or take a probiotic daily for the next month or two We want to replace the healthy germs in the gut If you notice foul, watery diarrhea in the next two months, schedule an appointment RIGHT AWAY If you get the worst headache of your life, call 911

## 2016-09-04 NOTE — Assessment & Plan Note (Signed)
ddx discussed; start antibiotics; get head CT and sinus CT and then refer to neurologist

## 2016-09-04 NOTE — Progress Notes (Signed)
BP 122/74   Pulse 95   Temp 98.3 F (36.8 C)   Resp 14   Wt 162 lb 8 oz (73.7 kg)   SpO2 98%   BMI 27.63 kg/m    Subjective:    Patient ID: Carrie Cross, female    DOB: May 03, 1976, 41 y.o.   MRN: [id number]  HPI: Carrie Cross is a 41 y.o. female  Chief Complaint  Patient presents with  . Migraine    hurts back of her head and tender and trobbing under her eyes   Headache right now; having headaches for a couple of months; not getting progressively worse Getting them 1-2 bad ones a week; she just keeps going because she has to Has had nausea with the headaches; has been wearing sunglasses outside more often No aura She is thinking these are migraines Hurts in the back of the head and over the left eye, bridge of the nose The medicine she got, we reviewed; she took a triptan and it took the edge off but didn't give good relief She started the medicine at night and took two nights, then got nauseated in the morning No recent or old head injury No orgasmic headaches No numbness or weakness in extremities Right now it's throbbing in the back of the head Right now, throbbing and going from  Was on the right side at first, now on the left No sinus drainage No fevers Neck hurts, not stiff  She has scaled back on the omeprazole for her acid reflux; ranitidine alone is not enough; taking omeprazole skipping some doses, scaling back  Depression screen [facility] 2/9 09/04/2016 07/10/2016 01/09/2016  Decreased Interest 0 0 0  Down, Depressed, Hopeless 0 0 0  PHQ - 2 Score 0 0 0   Relevant past medical, surgical, family and social history reviewed Past Medical History:  Diagnosis Date  . Allergic rhinitis   . Carpal tunnel syndrome   . Chronic migraine   . GERD (gastroesophageal reflux disease)   . Hx of pyelonephritis    as a child  . Hyperlipidemia   . Hypertension    Controlled per pt  . IFG (impaired fasting glucose)   . Irregular menstruation    Past  Surgical History:  Procedure Laterality Date  . BACK SURGERY  [id number]   disc shaved down   Family History  Problem Relation Age of Onset  . Stroke Neg Hx   . Heart disease Neg Hx   . Diabetes Neg Hx   . Cancer Neg Hx    Social History  Substance Use Topics  . Smoking status: Former Smoker    Packs/day: 0.50    Years: 7.00    Types: Cigarettes    Quit date: 06/02/2011  . Smokeless tobacco: Never Used  . Alcohol use 1.8 oz/week    3 Glasses of wine per week   Interim medical history since last visit reviewed. Allergies and medications reviewed  Review of Systems Per HPI unless specifically indicated above     Objective:    BP 122/74   Pulse 95   Temp 98.3 F (36.8 C)   Resp 14   Wt 162 lb 8 oz (73.7 kg)   SpO2 98%   BMI 27.63 kg/m   Wt Readings from Last 3 Encounters:  09/04/16 162 lb 8 oz (73.7 kg)  07/10/16 167 lb (75.8 kg)  01/09/16 167 lb (75.8 kg)    Physical Exam  Constitutional: She appears well-developed and well-nourished. No  distress.  HENT:  Head: Normocephalic and atraumatic.  Right Ear: Hearing, tympanic membrane, external ear and ear canal normal.  Left Ear: Hearing, tympanic membrane, external ear and ear canal normal.  Nose: Mucosal edema and rhinorrhea present. Left sinus exhibits maxillary sinus tenderness and frontal sinus tenderness.  Mouth/Throat: Oropharynx is clear and moist and mucous membranes are normal.  Turbinate hypertrophy  Eyes: EOM are normal. No scleral icterus.  Neck: Neck supple. No thyromegaly present.  Cardiovascular: Normal rate, regular rhythm and normal heart sounds.   No murmur heard. Pulmonary/Chest: Effort normal and breath sounds normal. No respiratory distress. She has no wheezes.  Abdominal: Soft. Bowel sounds are normal. She exhibits no distension.  Musculoskeletal: Normal range of motion. She exhibits no edema.  Lymphadenopathy:    She has no cervical adenopathy.  Neurological: She is alert. She displays no  tremor. No cranial nerve deficit. She exhibits normal muscle tone. Gait normal.  Skin: Skin is warm and dry. No rash noted. She is not diaphoretic. No pallor.  Psychiatric: She has a normal mood and affect. Her behavior is normal. Judgment and thought content normal.   Results for orders placed or performed in visit on 07/10/16  Lipid panel  Result Value Ref Range   Cholesterol 147 <200 mg/dL   Triglycerides 45 <150 mg/dL   HDL 58 >50 mg/dL   Total CHOL/HDL Ratio 2.5 <5.0 Ratio   VLDL 9 <30 mg/dL   LDL Cholesterol 80 <100 mg/dL  COMPLETE METABOLIC PANEL WITH GFR  Result Value Ref Range   Sodium 139 135 - 146 mmol/L   Potassium 4.2 3.5 - 5.3 mmol/L   Chloride 102 98 - 110 mmol/L   CO2 21 20 - 31 mmol/L   Glucose, Bld 94 65 - 99 mg/dL   BUN 9 7 - 25 mg/dL   Creat 0.63 0.50 - 1.10 mg/dL   Total Bilirubin 0.4 0.2 - 1.2 mg/dL   Alkaline Phosphatase 68 33 - 115 U/L   AST 15 10 - 30 U/L   ALT 19 6 - 29 U/L   Total Protein 7.7 6.1 - 8.1 g/dL   Albumin 4.6 3.6 - 5.1 g/dL   Calcium 10.1 8.6 - 10.2 mg/dL   GFR, Est African American >89 >=60 mL/min   GFR, Est Non African American >89 >=60 mL/min      Assessment & Plan:   Problem List Items Addressed This Visit      Cardiovascular and Mediastinum   Hypertension    Controlled, so I don't think her headaches represent uncontrolled HTN        Digestive   GERD (gastroesophageal reflux disease)    Continue to taper off of the PPI, as we have discussed warnings, risks of long-term use of PPIs        Other   Headache    ddx discussed; start antibiotics; get head CT and sinus CT and then refer to neurologist      Relevant Medications   Butalbital-Acetaminophen 50-300 MG TABS   Other Relevant Orders   CT Head Wo Contrast   CT Maxillofacial WO CM       Follow up plan: No Follow-up on file.  An after-visit summary was printed and given to the patient at [place].  Please see the patient instructions which may contain other  information and recommendations beyond what is mentioned above in the assessment and plan.  Meds ordered this encounter  Medications  . Butalbital-Acetaminophen 50-300 MG TABS    Sig:  One by mouth every six hours if needed for headache    Dispense:  30 tablet    Refill:  0  . amoxicillin-clavulanate (AUGMENTIN) 875-125 MG tablet    Sig: Take 1 tablet by mouth 2 (two) times daily.    Dispense:  20 tablet    Refill:  0    Orders Placed This Encounter  Procedures  . CT Head Wo Contrast  . CT Maxillofacial WO CM

## 2016-09-07 NOTE — Assessment & Plan Note (Signed)
Continue to taper off of the PPI, as we have discussed warnings, risks of long-term use of PPIs

## 2016-09-07 NOTE — Assessment & Plan Note (Signed)
Controlled, so I don't think her headaches represent uncontrolled HTN

## 2016-09-10 ENCOUNTER — Encounter: Payer: Self-pay | Admitting: Family Medicine

## 2016-09-10 DIAGNOSIS — G4452 New daily persistent headache (NDPH): Secondary | ICD-10-CM

## 2016-09-10 DIAGNOSIS — R928 Other abnormal and inconclusive findings on diagnostic imaging of breast: Secondary | ICD-10-CM

## 2016-09-10 DIAGNOSIS — Z1239 Encounter for other screening for malignant neoplasm of breast: Secondary | ICD-10-CM

## 2016-09-11 ENCOUNTER — Ambulatory Visit
Admission: RE | Admit: 2016-09-11 | Discharge: 2016-09-11 | Disposition: A | Discharge status: Home / Self Care | Payer: BC Managed Care – PPO | Source: Ambulatory Visit | Attending: Family Medicine | Admitting: Family Medicine

## 2016-09-11 ENCOUNTER — Ambulatory Visit: Admission: RE | Admit: 2016-09-11 | Payer: BC Managed Care – PPO | Source: Ambulatory Visit

## 2016-09-11 ENCOUNTER — Other Ambulatory Visit: Payer: BC Managed Care – PPO

## 2016-09-11 DIAGNOSIS — R51 Headache: Secondary | ICD-10-CM | POA: Insufficient documentation

## 2016-09-16 ENCOUNTER — Encounter: Payer: Self-pay | Admitting: Family Medicine

## 2016-09-17 DIAGNOSIS — Z1239 Encounter for other screening for malignant neoplasm of breast: Secondary | ICD-10-CM | POA: Insufficient documentation

## 2016-09-17 NOTE — Assessment & Plan Note (Signed)
Order diag mammo LEFT, routine mammo right

## 2016-09-17 NOTE — Telephone Encounter (Signed)
Orders for diag mammo LEFT, routine screening RIGHT

## 2016-09-17 NOTE — Assessment & Plan Note (Signed)
Refer to neuro 

## 2016-09-19 ENCOUNTER — Other Ambulatory Visit: Payer: Self-pay | Admitting: Family Medicine

## 2016-09-19 NOTE — Telephone Encounter (Signed)
We were having patient use less and less of this; should be weaning off Okay to use ranitidine 300 mg at night Avoid triggers

## 2016-09-19 NOTE — Telephone Encounter (Signed)
Patient notified

## 2016-10-04 ENCOUNTER — Other Ambulatory Visit: Payer: Self-pay | Admitting: Family Medicine

## 2016-10-04 DIAGNOSIS — R928 Other abnormal and inconclusive findings on diagnostic imaging of breast: Secondary | ICD-10-CM

## 2016-10-04 NOTE — Progress Notes (Signed)
Bilateral diagnostic mammogram and LEFT breast US due Oct 24, 2016 Orders entered by Dr. Sanda Klein and signed today by Dr. Sanda Klein  ---------------------------------  IMPRESSION: Probably benign left breast asymmetry.  RECOMMENDATION: Bilateral diagnostic mammogram and possible left breast ultrasound in 1 year.  I have discussed the findings and recommendations with the patient. Results were also provided in writing at the conclusion of the visit. If applicable, a reminder letter will be sent to the patient regarding the next appointment.  BI-RADS CATEGORY  3: Probably benign.   Electronically Signed   By: Pamelia Hoit M.D.   On: 10/25/2015 17:26  --------------------------------------  Orders Placed This Encounter  Procedures  . MM Digital Diagnostic Bilat    Standing Status:   Future    Standing Expiration Date:   12/02/2017    Order Specific Question:   Reason for Exam (SYMPTOM  OR DIAGNOSIS REQUIRED)    Answer:   abnormal breast imaging, see Oct 25, 2015 report    Order Specific Question:   Is the patient pregnant?    Answer:   No    Comments:   IUD    Order Specific Question:   Preferred imaging location?    Answer:   [facility]  . US BREAST LTD UNI LEFT INC AXILLA    Standing Status:   Future    Standing Expiration Date:   12/05/2016    Order Specific Question:   Reason for Exam (SYMPTOM  OR DIAGNOSIS REQUIRED)    Answer:   abnormal breast imaging Feb 2017; see report    Order Specific Question:   Preferred imaging location?    Answer:   [facility]

## 2016-10-04 NOTE — Assessment & Plan Note (Signed)
Order bilateral diag mammo and LEFT breast US, due around Oct 24, 2016

## 2016-11-16 ENCOUNTER — Encounter: Payer: Self-pay | Admitting: Family Medicine

## 2016-11-17 MED ORDER — FLUTICASONE PROPIONATE 50 MCG/ACT NA SUSP: 2.0000 | Freq: Every day | NASAL | 3 refills | Status: DC

## 2016-11-17 MED ORDER — FEXOFENADINE HCL 180 MG PO TABS: 180.0000 mg | ORAL_TABLET | Freq: Every day | ORAL | 3 refills | Status: DC | PRN

## 2016-11-21 ENCOUNTER — Encounter: Payer: Self-pay | Admitting: Family Medicine

## 2016-11-21 MED ORDER — FEXOFENADINE HCL 180 MG PO TABS: 180.0000 mg | ORAL_TABLET | Freq: Every day | ORAL | 3 refills | Status: DC | PRN

## 2016-11-21 MED ORDER — FLUTICASONE PROPIONATE 50 MCG/ACT NA SUSP: 2.0000 | Freq: Every day | NASAL | 3 refills | Status: DC

## 2016-12-03 ENCOUNTER — Other Ambulatory Visit: Payer: Self-pay | Admitting: Family Medicine

## 2016-12-03 ENCOUNTER — Ambulatory Visit
Admission: RE | Admit: 2016-12-03 | Discharge: 2016-12-03 | Disposition: A | Discharge status: Home / Self Care | Payer: BC Managed Care – PPO | Source: Ambulatory Visit | Attending: Family Medicine | Admitting: Family Medicine

## 2016-12-03 DIAGNOSIS — R928 Other abnormal and inconclusive findings on diagnostic imaging of breast: Secondary | ICD-10-CM

## 2016-12-03 DIAGNOSIS — N6489 Other specified disorders of breast: Secondary | ICD-10-CM | POA: Insufficient documentation

## 2016-12-04 ENCOUNTER — Telehealth: Payer: Self-pay

## 2016-12-04 NOTE — Telephone Encounter (Signed)
Yes, signed

## 2016-12-04 NOTE — Telephone Encounter (Signed)
I know you were having issues with this yesterday? Miel said she has logged a ticket.  But they called back today asking that you sign her mammogram order?  They said an order was faxed on paper yesterday but you did not sign your signature?

## 2016-12-26 ENCOUNTER — Other Ambulatory Visit: Payer: Self-pay | Admitting: Family Medicine

## 2016-12-26 NOTE — Telephone Encounter (Signed)
Last cmp reviewed; Rxs approved 

## 2017-01-10 ENCOUNTER — Telehealth: Payer: BC Managed Care – PPO | Admitting: Physician Assistant

## 2017-01-10 DIAGNOSIS — B9789 Other viral agents as the cause of diseases classified elsewhere: Secondary | ICD-10-CM

## 2017-01-10 DIAGNOSIS — J329 Chronic sinusitis, unspecified: Secondary | ICD-10-CM

## 2017-01-10 MED ORDER — FLUTICASONE PROPIONATE 50 MCG/ACT NA SUSP: 2.0000 | Freq: Every day | NASAL | 6 refills | Status: DC

## 2017-01-10 NOTE — Progress Notes (Signed)

## 2017-03-03 ENCOUNTER — Encounter: Payer: Self-pay | Admitting: Obstetrics and Gynecology

## 2017-03-03 ENCOUNTER — Ambulatory Visit (INDEPENDENT_AMBULATORY_CARE_PROVIDER_SITE_OTHER): Payer: BC Managed Care – PPO | Admitting: Obstetrics and Gynecology

## 2017-03-03 VITALS — BP 140/90 | HR 74 | Ht 64.0 in | Wt 162.6 lb

## 2017-03-03 DIAGNOSIS — L309 Dermatitis, unspecified: Secondary | ICD-10-CM | POA: Diagnosis not present

## 2017-03-03 DIAGNOSIS — Z30431 Encounter for routine checking of intrauterine contraceptive device: Secondary | ICD-10-CM | POA: Diagnosis not present

## 2017-03-03 MED ORDER — CLOBETASOL PROPIONATE 0.05 % EX OINT: 1.0000 "application " | TOPICAL_OINTMENT | Freq: Two times a day (BID) | CUTANEOUS | 2 refills | Status: DC

## 2017-03-03 NOTE — Progress Notes (Signed)
Subjective:     Patient ID: Carrie Cross, female   DOB: 08/31/1976, 41 y.o.   MRN: [id number]  HPI   Mirena placed 3 years ago, no menses, patients husband states he can feel it during intercourse for the last month. Also noted spotting the end May. Nfot sure last time doctor checked IUD.    Flare up of eczema on right anticubital. States it happens every summer from the heat.  Review of Systems Negative except stated above in HPI    Objective:   Physical Exam A&Ox4 Well groomed female in no distress Blood pressure 140/90, pulse 74, height 5\' 4"  (1.626 m), weight 162 lb 9.6 oz (73.8 kg). Patchy raised rash on right lower forearm c/w eczema Pelvic exam: normal external genitalia, vulva, vagina, cervix, uterus and adnexa, IUD string noted..    Assessment:     IUD check Eczema     Plan:    reassured of normal findings.  clobex cream sent in and instructed on use. Will return late November for pap and physical or sooner if needed.  Melody Haz[place]rest, CNM

## 2017-04-11 ENCOUNTER — Encounter: Payer: Self-pay | Admitting: Family Medicine

## 2017-04-13 NOTE — Telephone Encounter (Signed)
See second note; she did not need refill after all

## 2017-05-21 ENCOUNTER — Encounter: Payer: Self-pay | Admitting: Family Medicine

## 2017-05-31 ENCOUNTER — Other Ambulatory Visit: Payer: Self-pay | Admitting: Family Medicine

## 2017-06-01 NOTE — Telephone Encounter (Signed)
Please let pt know that she'll be due for fasting lipids and other labs, plus a visit on or after 07/12/17 I'll refill medicine so she won't run out Thank you

## 2017-06-02 ENCOUNTER — Encounter: Payer: Self-pay | Admitting: Family Medicine

## 2017-06-03 ENCOUNTER — Encounter: Payer: Self-pay | Admitting: Family Medicine

## 2017-06-04 MED ORDER — FEXOFENADINE HCL 180 MG PO TABS: 180.0000 mg | ORAL_TABLET | Freq: Every day | ORAL | 3 refills | Status: DC | PRN

## 2017-06-08 NOTE — Telephone Encounter (Signed)
Pt scheduled  

## 2017-06-24 ENCOUNTER — Other Ambulatory Visit: Payer: Self-pay | Admitting: Family Medicine

## 2017-07-13 ENCOUNTER — Encounter: Payer: Self-pay | Admitting: Family Medicine

## 2017-07-13 ENCOUNTER — Ambulatory Visit: Payer: BC Managed Care – PPO | Admitting: Family Medicine

## 2017-07-13 VITALS — BP 126/78 | HR 68 | Temp 98.3°F | Resp 16 | Wt 166.0 lb

## 2017-07-13 DIAGNOSIS — Z862 Personal history of diseases of the blood and blood-forming organs and certain disorders involving the immune mechanism: Secondary | ICD-10-CM

## 2017-07-13 DIAGNOSIS — K219 Gastro-esophageal reflux disease without esophagitis: Secondary | ICD-10-CM

## 2017-07-13 DIAGNOSIS — R7301 Impaired fasting glucose: Secondary | ICD-10-CM

## 2017-07-13 DIAGNOSIS — I10 Essential (primary) hypertension: Secondary | ICD-10-CM | POA: Diagnosis not present

## 2017-07-13 DIAGNOSIS — Z5181 Encounter for therapeutic drug level monitoring: Secondary | ICD-10-CM

## 2017-07-13 DIAGNOSIS — J309 Allergic rhinitis, unspecified: Secondary | ICD-10-CM

## 2017-07-13 DIAGNOSIS — E785 Hyperlipidemia, unspecified: Secondary | ICD-10-CM | POA: Diagnosis not present

## 2017-07-13 MED ORDER — OMEPRAZOLE 20 MG PO TBDD: 20.0000 mg | DELAYED_RELEASE_TABLET | Freq: Every day | ORAL | 1 refills | Status: DC

## 2017-07-13 MED ORDER — SCOPOLAMINE 1 MG/3DAYS TD PT72: 1.0000 | MEDICATED_PATCH | TRANSDERMAL | 1 refills | Status: DC

## 2017-07-13 NOTE — Patient Instructions (Signed)
Try to follow the DASH guidelines (DASH stands for Dietary Approaches to Stop Hypertension) Try to limit the sodium in your diet.  Ideally, consume less than 1.5 grams (less than 1,500mg ) per day. Do not add salt when cooking or at the table.  Check the sodium amount on labels when shopping, and choose items lower in sodium when given a choice. Avoid or limit foods that already contain a lot of sodium. Eat a diet rich in fruits and vegetables and whole grains. Try to limit saturated fats in your diet (bologna, hot dogs, barbeque, cheeseburgers, hamburgers, steak, bacon, sausage, cheese, etc.) and get more fresh fruits, vegetables, and whole grains

## 2017-07-13 NOTE — Assessment & Plan Note (Signed)
Continue medicines.

## 2017-07-13 NOTE — Assessment & Plan Note (Signed)
Check fatsting lipids today; continue statin; limit saturated fats

## 2017-07-13 NOTE — Progress Notes (Signed)
BP 126/78   Pulse 68   Temp 98.3 F (36.8 C) (Oral)   Resp 16   Wt 166 lb (75.3 kg)   BMI 28.49 kg/m    Subjective:    Patient ID: Carrie Cross, female    DOB: February 18, 1976, 41 y.o.   MRN: [id number]  HPI: Carrie Cross is a 41 y.o. female  Chief Complaint  Patient presents with  . Follow-up   HPI Patient is here for f/u No medical excitement since last visit  GERD; just started back on the omeprazole; still has it despite whatever she eats; the H2 blocker was not enough; bad reflux; no blood in the stools, no dark stools, no boring abd pain; mother does not have reflux, not sure about rest of the family  She is going on a cruise on Saturday; needs the patch  High cholesterol; on statin; tries to limit fatty foods  HTN; now controlled with just one medicine; off HCTZ; healtheir eating; cut back on salt; HTN does run in the family  Allergies; on allegra and nasal spray  Last A1c was 6; prediabetes range; no diabetes in the family; not much starch; does drink sodas   Depression screen [facility] 2/9 07/13/2017 09/04/2016 07/10/2016 01/09/2016  Decreased Interest 0 0 0 0  Down, Depressed, Hopeless 0 0 0 0  PHQ - 2 Score 0 0 0 0    Relevant past medical, surgical, family and social history reviewed Past Medical History:  Diagnosis Date  . Allergic rhinitis   . Carpal tunnel syndrome   . Chronic migraine   . GERD (gastroesophageal reflux disease)   . Hx of pyelonephritis    as a child  . Hyperlipidemia   . Hypertension    Controlled per pt  . IFG (impaired fasting glucose)   . Irregular menstruation    Past Surgical History:  Procedure Laterality Date  . BACK SURGERY  [id number]   disc shaved down   Family History  Problem Relation Age of Onset  . Stroke Neg Hx   . Heart disease Neg Hx   . Diabetes Neg Hx   . Cancer Neg Hx   . Breast cancer Neg Hx    Social History   Socioeconomic History  . Marital status: Married    Spouse name: Not on file  .  Number of children: Not on file  . Years of education: Not on file  . Highest education level: Not on file  Social Needs  . Financial resource strain: Not on file  . Food insecurity - worry: Not on file  . Food insecurity - inability: Not on file  . Transportation needs - medical: Not on file  . Transportation needs - non-medical: Not on file  Occupational History  . Not on file  Tobacco Use  . Smoking status: Former Smoker    Packs/day: 0.50    Years: 7.00    Pack years: 3.50    Types: Cigarettes    Last attempt to quit: 06/02/2011    Years since quitting: 6.1  . Smokeless tobacco: Never Used  Substance and Sexual Activity  . Alcohol use: Yes    Alcohol/week: 1.8 oz    Types: 3 Glasses of wine per week  . Drug use: No  . Sexual activity: Yes  Other Topics Concern  . Not on file  Social History Narrative  . Not on file    Interim medical history since last visit reviewed. Allergies and medications reviewed  Review  of Systems Per HPI unless specifically indicated above     Objective:    BP 126/78   Pulse 68   Temp 98.3 F (36.8 C) (Oral)   Resp 16   Wt 166 lb (75.3 kg)   BMI 28.49 kg/m   Wt Readings from Last 3 Encounters:  07/13/17 166 lb (75.3 kg)  03/03/17 162 lb 9.6 oz (73.8 kg)  09/04/16 162 lb 8 oz (73.7 kg)    Physical Exam  Constitutional: She appears well-developed and well-nourished. No distress.  HENT:  Head: Normocephalic and atraumatic.  Eyes: EOM are normal. No scleral icterus.  Neck: No thyromegaly present.  Cardiovascular: Normal rate, regular rhythm and normal heart sounds.  No murmur heard. Pulmonary/Chest: Effort normal and breath sounds normal. No respiratory distress. She has no wheezes.  Abdominal: Soft. Bowel sounds are normal. She exhibits no distension.  Musculoskeletal: Normal range of motion. She exhibits no edema.  Neurological: She is alert. She exhibits normal muscle tone.  Skin: Skin is warm and dry. She is not  diaphoretic. No pallor.  Psychiatric: She has a normal mood and affect. Her behavior is normal. Judgment and thought content normal.    Results for orders placed or performed in visit on 07/10/16  Lipid panel  Result Value Ref Range   Cholesterol 147 <200 mg/dL   Triglycerides 45 <150 mg/dL   HDL 58 >50 mg/dL   Total CHOL/HDL Ratio 2.5 <5.0 Ratio   VLDL 9 <30 mg/dL   LDL Cholesterol 80 <100 mg/dL  COMPLETE METABOLIC PANEL WITH GFR  Result Value Ref Range   Sodium 139 135 - 146 mmol/L   Potassium 4.2 3.5 - 5.3 mmol/L   Chloride 102 98 - 110 mmol/L   CO2 21 20 - 31 mmol/L   Glucose, Bld 94 65 - 99 mg/dL   BUN 9 7 - 25 mg/dL   Creat 0.63 0.50 - 1.10 mg/dL   Total Bilirubin 0.4 0.2 - 1.2 mg/dL   Alkaline Phosphatase 68 33 - 115 U/L   AST 15 10 - 30 U/L   ALT 19 6 - 29 U/L   Total Protein 7.7 6.1 - 8.1 g/dL   Albumin 4.6 3.6 - 5.1 g/dL   Calcium 10.1 8.6 - 10.2 mg/dL   GFR, Est African American >89 >=60 mL/min   GFR, Est Non African American >89 >=60 mL/min      Assessment & Plan:   Problem List Items Addressed This Visit      Cardiovascular and Mediastinum   Hypertension    Controlled with just one agent        Respiratory   Allergic rhinitis    Continue medicines        Digestive   GERD (gastroesophageal reflux disease)    No red flags, but still needing PPI and H2 blocker; if you do develop red flags, [atient instructed to call me and we'll refer for EGD      Relevant Medications   Omeprazole 20 MG TBDD   scopolamine (TRANSDERM-SCOP, 1.5 MG,) 1 MG/3DAYS     Endocrine   IFG (impaired fasting glucose)    Limit sweetened drinks; check glucose and A1c      Relevant Orders   Hemoglobin A1c     Other   Medication monitoring encounter    Monitor liver and kidneys      Relevant Orders   COMPLETE METABOLIC PANEL WITH GFR   Hyperlipidemia    Check fatsting lipids today; continue statin; limit saturated  fats      Relevant Orders   Lipid panel      Other Visit Diagnoses    Hx of iron deficiency anemia    -  Primary   Relevant Orders   CBC with Differential/Platelet       Follow up plan: No Follow-up on file.  An after-visit summary was printed and given to the patient at [place].  Please see the patient instructions which may contain other information and recommendations beyond what is mentioned above in the assessment and plan.  Meds ordered this encounter  Medications  . DISCONTD: Omeprazole 20 MG TBDD    Sig: Take 20 mg daily by mouth.  . Omeprazole 20 MG TBDD    Sig: Take 20 mg daily by mouth.    Dispense:  90 tablet    Refill:  1  . scopolamine (TRANSDERM-SCOP, 1.5 MG,) 1 MG/3DAYS    Sig: Place 1 patch (1.5 mg total) every 3 (three) days onto the skin.    Dispense:  4 patch    Refill:  1    Orders Placed This Encounter  Procedures  . COMPLETE METABOLIC PANEL WITH GFR  . Hemoglobin A1c  . CBC with Differential/Platelet  . Lipid panel

## 2017-07-13 NOTE — Assessment & Plan Note (Signed)
Limit sweetened drinks; check glucose and A1c

## 2017-07-13 NOTE — Assessment & Plan Note (Signed)
Controlled with just one agent

## 2017-07-13 NOTE — Assessment & Plan Note (Signed)
No red flags, but still needing PPI and H2 blocker; if you do develop red flags, [atient instructed to call me and we'll refer for EGD

## 2017-07-13 NOTE — Assessment & Plan Note (Signed)
Monitor liver and kidneys 

## 2017-07-14 LAB — COMPLETE METABOLIC PANEL WITH GFR
AG Ratio: 1.9 (calc) (ref 1.0–2.5)
ALT: 17 U/L (ref 6–29)
AST: 13 U/L (ref 10–30)
Albumin: 4.3 g/dL (ref 3.6–5.1)
Alkaline phosphatase (APISO): 70 U/L (ref 33–115)
BILIRUBIN TOTAL: 0.3 mg/dL (ref 0.2–1.2)
BUN: 12 mg/dL (ref 7–25)
CALCIUM: 9.2 mg/dL (ref 8.6–10.2)
CHLORIDE: 104 mmol/L (ref 98–110)
CO2: 27 mmol/L (ref 20–32)
CREATININE: 0.63 mg/dL (ref 0.50–1.10)
GFR, EST AFRICAN AMERICAN: 130 mL/min/{1.73_m2} (ref >=60)
GFR, Est Non African American: 112 mL/min/{1.73_m2} (ref >=60)
Globulin: 2.3 g/dL (calc) (ref 1.9–3.7)
Glucose, Bld: 95 mg/dL (ref 65–99)
Potassium: 4.3 mmol/L (ref 3.5–5.3)
Sodium: 138 mmol/L (ref 135–146)
TOTAL PROTEIN: 6.6 g/dL (ref 6.1–8.1)

## 2017-07-14 LAB — CBC WITH DIFFERENTIAL/PLATELET
BASOS ABS: 36 {cells}/uL (ref 0–200)
Basophils Relative: 0.4 %
EOS PCT: 1.4 %
Eosinophils Absolute: 127 cells/uL (ref 15–500)
HEMATOCRIT: 36.2 % (ref 35.0–45.0)
Hemoglobin: 12.5 g/dL (ref 11.7–15.5)
LYMPHS ABS: 3012 {cells}/uL (ref 850–3900)
MCH: 30.9 pg (ref 27.0–33.0)
MCHC: 34.5 g/dL (ref 32.0–36.0)
MCV: 89.6 fL (ref 80.0–100.0)
MPV: 9.1 fL (ref 7.5–12.5)
Monocytes Relative: 5.2 %
Neutro Abs: 5451 cells/uL (ref 1500–7800)
Neutrophils Relative %: 59.9 %
Platelets: 378 10*3/uL (ref 140–400)
RBC: 4.04 10*6/uL (ref 3.80–5.10)
RDW: 12.5 % (ref 11.0–15.0)
Total Lymphocyte: 33.1 %
WBC mixed population: 473 cells/uL (ref 200–950)
WBC: 9.1 10*3/uL (ref 3.8–10.8)

## 2017-07-14 LAB — LIPID PANEL
CHOL/HDL RATIO: 2.1 (calc) (ref <5.0)
Cholesterol: 135 mg/dL (ref <200)
HDL: 63 mg/dL (ref >50)
LDL Cholesterol (Calc): 63 mg/dL (calc)
NON-HDL CHOLESTEROL (CALC): 72 mg/dL (ref <130)
TRIGLYCERIDES: 33 mg/dL (ref <150)

## 2017-07-14 LAB — HEMOGLOBIN A1C
HEMOGLOBIN A1C: 5.8 %{Hb} — AB (ref <5.7)
MEAN PLASMA GLUCOSE: 120 (calc)
eAG (mmol/L): 6.6 (calc)

## 2017-08-13 ENCOUNTER — Ambulatory Visit (INDEPENDENT_AMBULATORY_CARE_PROVIDER_SITE_OTHER): Payer: BC Managed Care – PPO | Admitting: Family Medicine

## 2017-08-13 ENCOUNTER — Encounter: Payer: Self-pay | Admitting: Family Medicine

## 2017-08-13 VITALS — BP 140/82 | HR 92 | Temp 98.7°F | Resp 14 | Ht 64.38 in | Wt 166.2 lb

## 2017-08-13 DIAGNOSIS — Z124 Encounter for screening for malignant neoplasm of cervix: Secondary | ICD-10-CM | POA: Diagnosis not present

## 2017-08-13 DIAGNOSIS — Z0000 Encounter for general adult medical examination without abnormal findings: Secondary | ICD-10-CM | POA: Diagnosis not present

## 2017-08-13 DIAGNOSIS — Z30431 Encounter for routine checking of intrauterine contraceptive device: Secondary | ICD-10-CM | POA: Diagnosis not present

## 2017-08-13 DIAGNOSIS — N898 Other specified noninflammatory disorders of vagina: Secondary | ICD-10-CM | POA: Diagnosis not present

## 2017-08-13 MED ORDER — BUTALBITAL-ACETAMINOPHEN 50-300 MG PO TABS: ORAL_TABLET | ORAL | 1 refills | Status: DC

## 2017-08-13 NOTE — Assessment & Plan Note (Signed)
Will have GYN confirm IUD still in place; I could neither see nor feel the IUD string today; patient cannot feel it

## 2017-08-13 NOTE — Assessment & Plan Note (Signed)
I could not visualize the cervix; will refer to GYN, given her IUD (need to confirm placement) and hx of abnormal pap and no path report on the chart; want to make sure thorough pap collected and IUD string seen/felt

## 2017-08-13 NOTE — Assessment & Plan Note (Signed)
USPSTF grade A and B recommendations reviewed with patient; age-appropriate recommendations, preventive care, screening tests, etc discussed and encouraged; healthy living encouraged; see AVS for patient education given to patient  

## 2017-08-13 NOTE — Patient Instructions (Addendum)
We'll get your pap smear specimen sent off If you have not heard anything from my staff in a week about any orders/referrals/studies from today, please contact us here to follow-up [phone] Health Maintenance, Female Adopting a healthy lifestyle and getting preventive care can go a long way to promote health and wellness. Talk with your health care provider about what schedule of regular examinations is right for you. This is a good chance for you to check in with your provider about disease prevention and staying healthy. In between checkups, there are plenty of things you can do on your own. Experts have done a lot of research about which lifestyle changes and preventive measures are most likely to keep you healthy. Ask your health care provider for more information. Weight and diet Eat a healthy diet  Be sure to include plenty of vegetables, fruits, low-fat dairy products, and lean protein.  Do not eat a lot of foods high in solid fats, added sugars, or salt.  Get regular exercise. This is one of the most important things you can do for your health. ? Most adults should exercise for at least 150 minutes each week. The exercise should increase your heart rate and make you sweat (moderate-intensity exercise). ? Most adults should also do strengthening exercises at least twice a week. This is in addition to the moderate-intensity exercise.  Maintain a healthy weight  Body mass index (BMI) is a measurement that can be used to identify possible weight problems. It estimates body fat based on height and weight. Your health care provider can help determine your BMI and help you achieve or maintain a healthy weight.  For females 44 years of age and older: ? A BMI below 18.5 is considered underweight. ? A BMI of 18.5 to 24.9 is normal. ? A BMI of 25 to 29.9 is considered overweight. ? A BMI of 30 and above is considered obese.  Watch levels of cholesterol and blood lipids  You should  start having your blood tested for lipids and cholesterol at 41 years of age, then have this test every 5 years.  You may need to have your cholesterol levels checked more often if: ? Your lipid or cholesterol levels are high. ? You are older than 41 years of age. ? You are at high risk for heart disease.  Cancer screening Lung Cancer  Lung cancer screening is recommended for adults 8-72 years old who are at high risk for lung cancer because of a history of smoking.  A yearly low-dose CT scan of the lungs is recommended for people who: ? Currently smoke. ? Have quit within the past 15 years. ? Have at least a 30-pack-year history of smoking. A pack year is smoking an average of one pack of cigarettes a day for 1 year.  Yearly screening should continue until it has been 15 years since you quit.  Yearly screening should stop if you develop a health problem that would prevent you from having lung cancer treatment.  Breast Cancer  Practice breast self-awareness. This means understanding how your breasts normally appear and feel.  It also means doing regular breast self-exams. Let your health care provider know about any changes, no matter how small.  If you are in your 20s or 30s, you should have a clinical breast exam (CBE) by a health care provider every 1-3 years as part of a regular health exam.  If you are 29 or older, have a CBE every year. Also consider  having a breast X-ray (mammogram) every year.  If you have a family history of breast cancer, talk to your health care provider about genetic screening.  If you are at high risk for breast cancer, talk to your health care provider about having an MRI and a mammogram every year.  Breast cancer gene (BRCA) assessment is recommended for women who have family members with BRCA-related cancers. BRCA-related cancers include: ? Breast. ? Ovarian. ? Tubal. ? Peritoneal cancers.  Results of the assessment will determine the need  for genetic counseling and BRCA1 and BRCA2 testing.  Cervical Cancer Your health care provider may recommend that you be screened regularly for cancer of the pelvic organs (ovaries, uterus, and vagina). This screening involves a pelvic examination, including checking for microscopic changes to the surface of your cervix (Pap test). You may be encouraged to have this screening done every 3 years, beginning at age 31.  For women ages 65-65, health care providers may recommend pelvic exams and Pap testing every 3 years, or they may recommend the Pap and pelvic exam, combined with testing for human papilloma virus (HPV), every 5 years. Some types of HPV increase your risk of cervical cancer. Testing for HPV may also be done on women of any age with unclear Pap test results.  Other health care providers may not recommend any screening for nonpregnant women who are considered low risk for pelvic cancer and who do not have symptoms. Ask your health care provider if a screening pelvic exam is right for you.  If you have had past treatment for cervical cancer or a condition that could lead to cancer, you need Pap tests and screening for cancer for at least 20 years after your treatment. If Pap tests have been discontinued, your risk factors (such as having a new sexual partner) need to be reassessed to determine if screening should resume. Some women have medical problems that increase the chance of getting cervical cancer. In these cases, your health care provider may recommend more frequent screening and Pap tests.  Colorectal Cancer  This type of cancer can be detected and often prevented.  Routine colorectal cancer screening usually begins at 41 years of age and continues through 41 years of age.  Your health care provider may recommend screening at an earlier age if you have risk factors for colon cancer.  Your health care provider may also recommend using home test kits to check for hidden blood in  the stool.  A small camera at the end of a tube can be used to examine your colon directly (sigmoidoscopy or colonoscopy). This is done to check for the earliest forms of colorectal cancer.  Routine screening usually begins at age 61.  Direct examination of the colon should be repeated every 5-10 years through 41 years of age. However, you may need to be screened more often if early forms of precancerous polyps or small growths are found.  Skin Cancer  Check your skin from head to toe regularly.  Tell your health care provider about any new moles or changes in moles, especially if there is a change in a mole's shape or color.  Also tell your health care provider if you have a mole that is larger than the size of a pencil eraser.  Always use sunscreen. Apply sunscreen liberally and repeatedly throughout the day.  Protect yourself by wearing long sleeves, pants, a wide-brimmed hat, and sunglasses whenever you are outside.  Heart disease, diabetes, and high blood pressure  High blood pressure causes heart disease and increases the risk of stroke. High blood pressure is more likely to develop in: ? People who have blood pressure in the high end of the normal range (130-139/85-89 mm Hg). ? People who are overweight or obese. ? People who are African American.  If you are 14-51 years of age, have your blood pressure checked every 3-5 years. If you are 50 years of age or older, have your blood pressure checked every year. You should have your blood pressure measured twice-once when you are at a hospital or clinic, and once when you are not at a hospital or clinic. Record the average of the two measurements. To check your blood pressure when you are not at a hospital or clinic, you can use: ? An automated blood pressure machine at a pharmacy. ? A home blood pressure monitor.  If you are between 6 years and 19 years old, ask your health care provider if you should take aspirin to prevent  strokes.  Have regular diabetes screenings. This involves taking a blood sample to check your fasting blood sugar level. ? If you are at a normal weight and have a low risk for diabetes, have this test once every three years after 41 years of age. ? If you are overweight and have a high risk for diabetes, consider being tested at a younger age or more often. Preventing infection Hepatitis B  If you have a higher risk for hepatitis B, you should be screened for this virus. You are considered at high risk for hepatitis B if: ? You were born in a country where hepatitis B is common. Ask your health care provider which countries are considered high risk. ? Your parents were born in a high-risk country, and you have not been immunized against hepatitis B (hepatitis B vaccine). ? You have HIV or AIDS. ? You use needles to inject street drugs. ? You live with someone who has hepatitis B. ? You have had sex with someone who has hepatitis B. ? You get hemodialysis treatment. ? You take certain medicines for conditions, including cancer, organ transplantation, and autoimmune conditions.  Hepatitis C  Blood testing is recommended for: ? Everyone born from [birth year] through 1965. ? Anyone with known risk factors for hepatitis C.  Sexually transmitted infections (STIs)  You should be screened for sexually transmitted infections (STIs) including gonorrhea and chlamydia if: ? You are sexually active and are younger than 41 years of age. ? You are older than 41 years of age and your health care provider tells you that you are at risk for this type of infection. ? Your sexual activity has changed since you were last screened and you are at an increased risk for chlamydia or gonorrhea. Ask your health care provider if you are at risk.  If you do not have HIV, but are at risk, it may be recommended that you take a prescription medicine daily to prevent HIV infection. This is called pre-exposure prophylaxis  (PrEP). You are considered at risk if: ? You are sexually active and do not regularly use condoms or know the HIV status of your partner(s). ? You take drugs by injection. ? You are sexually active with a partner who has HIV.  Talk with your health care provider about whether you are at high risk of being infected with HIV. If you choose to begin PrEP, you should first be tested for HIV. You should then be tested every 3 months  for as long as you are taking PrEP. Pregnancy  If you are premenopausal and you may become pregnant, ask your health care provider about preconception counseling.  If you may become pregnant, take 400 to 800 micrograms (mcg) of folic acid every day.  If you want to prevent pregnancy, talk to your health care provider about birth control (contraception). Osteoporosis and menopause  Osteoporosis is a disease in which the bones lose minerals and strength with aging. This can result in serious bone fractures. Your risk for osteoporosis can be identified using a bone density scan.  If you are 8 years of age or older, or if you are at risk for osteoporosis and fractures, ask your health care provider if you should be screened.  Ask your health care provider whether you should take a calcium or vitamin D supplement to lower your risk for osteoporosis.  Menopause may have certain physical symptoms and risks.  Hormone replacement therapy may reduce some of these symptoms and risks. Talk to your health care provider about whether hormone replacement therapy is right for you. Follow these instructions at home:  Schedule regular health, dental, and eye exams.  Stay current with your immunizations.  Do not use any tobacco products including cigarettes, chewing tobacco, or electronic cigarettes.  If you are pregnant, do not drink alcohol.  If you are breastfeeding, limit how much and how often you drink alcohol.  Limit alcohol intake to no more than 1 drink per day for  nonpregnant women. One drink equals 12 ounces of beer, 5 ounces of wine, or 1 ounces of hard liquor.  Do not use street drugs.  Do not share needles.  Ask your health care provider for help if you need support or information about quitting drugs.  Tell your health care provider if you often feel depressed.  Tell your health care provider if you have ever been abused or do not feel safe at home. This information is not intended to replace advice given to you by your health care provider. Make sure you discuss any questions you have with your health care provider. Document Released: 03/03/2011 Document Revised: 01/24/2016 Document Reviewed: 05/22/2015 Elsevier Interactive Patient Education  [employer].

## 2017-08-13 NOTE — Progress Notes (Signed)
BP 140/82   Pulse 92   Temp 98.7 F (37.1 C) (Oral)   Resp 14   Ht 5' 4.38" (1.635 m)   Wt 166 lb 3.2 oz (75.4 kg)   SpO2 99%   BMI 28.20 kg/m    Subjective:    Patient ID: Carrie Cross, female    DOB: [birth year], 42 y.o.   MRN: [id number]  HPI: Carrie Cross is a 41 y.o. female  Chief Complaint  Patient presents with  . Annual Exam    HPI USPSTF grade A and B recommendations Depression:  Depression screen [facility] 2/9 08/13/2017 07/13/2017 09/04/2016 07/10/2016 01/09/2016  Decreased Interest 0 0 0 0 0  Down, Depressed, Hopeless 0 0 0 0 0  PHQ - 2 Score 0 0 0 0 0   Hypertension: salty food yesterday; kind of rushing around this morning BP Readings from Last 3 Encounters:  08/13/17 140/82  07/13/17 126/78  03/03/17 140/90   Obesity: Wt Readings from Last 3 Encounters:  08/13/17 166 lb 3.2 oz (75.4 kg)  07/13/17 166 lb (75.3 kg)  03/03/17 162 lb 9.6 oz (73.8 kg)   BMI Readings from Last 3 Encounters:  08/13/17 28.20 kg/m  07/13/17 28.49 kg/m  03/03/17 27.91 kg/m    Skin cancer: no worrisome moles Lung cancer:  n/a Breast cancer: next mammo due April 2019; not any new lumps Colorectal cancer: no fam hx  BRCA gene screening: family hx of breast and/or ovarian cancer and/or metastatic prostate cancer? Grandmother may have had breast cancer (MGM) Cervical cancer screening: hx of abnormal pap smear; not sure when; had to have cryotherapy in her late 20's after delivery; in [place] HIV, hep B, hep C: declined, already done STD testing and prevention (chl/gon/syphilis): not intetested Intimate partner violence: no abuse Contraception: IUD Osteoporosis: n/a Fall prevention/vitamin D: discussed  Diet: trying to eat better; using zantac PM, omeprazole AM Exercise: discussed Alcohol: 3 a week Tobacco use: quit 2012 Aspirin: n/a Lipids:  Lab Results  Component Value Date   CHOL 135 07/13/2017   CHOL 147 07/11/2016   CHOL 144 01/09/2016   Lab  Results  Component Value Date   HDL 63 07/13/2017   HDL 58 07/11/2016   HDL 57 01/09/2016   Lab Results  Component Value Date   LDLCALC 80 07/11/2016   LDLCALC 79 01/09/2016   LDLCALC 89 07/25/2015   Lab Results  Component Value Date   TRIG 33 07/13/2017   TRIG 45 07/11/2016   TRIG 39 01/09/2016   Lab Results  Component Value Date   CHOLHDL 2.1 07/13/2017   CHOLHDL 2.5 07/11/2016   No results found for: LDLDIRECT Glucose:  Glucose  Date Value Ref Range Status  01/09/2016 93 65 - 99 mg/dL Final  07/25/2015 97 65 - 99 mg/dL Final   Glucose, Bld  Date Value Ref Range Status  07/13/2017 95 65 - 99 mg/dL Final    Comment:    .            Fasting reference interval .   07/11/2016 94 65 - 99 mg/dL Final    Depression screen [facility] 2/9 08/13/2017 07/13/2017 09/04/2016 07/10/2016 01/09/2016  Decreased Interest 0 0 0 0 0  Down, Depressed, Hopeless 0 0 0 0 0  PHQ - 2 Score 0 0 0 0 0    Relevant past medical, surgical, family and social history reviewed Past Medical History:  Diagnosis Date  . Allergic rhinitis   . Carpal tunnel syndrome   .  Chronic migraine   . GERD (gastroesophageal reflux disease)   . Hx of pyelonephritis    as a child  . Hyperlipidemia   . Hypertension    Controlled per pt  . IFG (impaired fasting glucose)   . Irregular menstruation    Past Surgical History:  Procedure Laterality Date  . BACK SURGERY  [id number]   disc shaved down   Family History  Problem Relation Age of Onset  . Breast cancer Maternal Grandmother 80  . Heart disease Maternal Grandfather   . Stroke Neg Hx   . Diabetes Neg Hx    Social History   Tobacco Use  . Smoking status: Former Smoker    Packs/day: 0.50    Years: 7.00    Pack years: 3.50    Types: Cigarettes    Last attempt to quit: 06/02/2011    Years since quitting: 6.2  . Smokeless tobacco: Never Used  Substance Use Topics  . Alcohol use: Yes    Alcohol/week: 1.8 oz    Types: 3 Glasses of wine per week  .  Drug use: No    Interim medical history since last visit reviewed. Allergies and medications reviewed  Review of Systems Per HPI unless specifically indicated above     Objective:    BP 140/82   Pulse 92   Temp 98.7 F (37.1 C) (Oral)   Resp 14   Ht 5' 4.38" (1.635 m)   Wt 166 lb 3.2 oz (75.4 kg)   SpO2 99%   BMI 28.20 kg/m   Wt Readings from Last 3 Encounters:  08/13/17 166 lb 3.2 oz (75.4 kg)  07/13/17 166 lb (75.3 kg)  03/03/17 162 lb 9.6 oz (73.8 kg)    Physical Exam  Constitutional: She appears well-developed and well-nourished.  HENT:  Head: Normocephalic and atraumatic.  Eyes: Conjunctivae and EOM are normal. Right eye exhibits no hordeolum. Left eye exhibits no hordeolum. No scleral icterus.  Neck: Carotid bruit is not present. No thyromegaly present.  Cardiovascular: Normal rate, regular rhythm, S1 normal, S2 normal and normal heart sounds.  No extrasystoles are present.  Pulmonary/Chest: Effort normal and breath sounds normal. No respiratory distress. Right breast exhibits no inverted nipple, no mass, no nipple discharge, no skin change and no tenderness. Left breast exhibits no inverted nipple, no mass, no nipple discharge, no skin change and no tenderness. Breasts are symmetrical.  Abdominal: Soft. Normal appearance and bowel sounds are normal. She exhibits no distension, no abdominal bruit, no pulsatile midline mass and no mass. There is no hepatosplenomegaly. There is no tenderness. No hernia.  Genitourinary: Uterus normal. Pelvic exam was performed with patient prone. There is no rash or lesion on the right labia. There is no rash or lesion on the left labia. Right adnexum displays no mass, no tenderness and no fullness. Left adnexum displays no mass, no tenderness and no fullness. Vaginal discharge found.  Genitourinary Comments: Cervix not visible, despite redirection and repositioning several times of the speculum  Musculoskeletal: Normal range of motion. She  exhibits no edema.  Lymphadenopathy:       Head (right side): No submandibular adenopathy present.       Head (left side): No submandibular adenopathy present.    She has no cervical adenopathy.    She has no axillary adenopathy.  Neurological: She is alert. She displays no tremor. No cranial nerve deficit. She exhibits normal muscle tone. Gait normal.  Skin: Skin is warm and dry. No bruising and no ecchymosis  noted. No cyanosis. No pallor.  Psychiatric: Her speech is normal and behavior is normal. Thought content normal. Her mood appears not anxious. She does not exhibit a depressed mood.        Assessment & Plan:   Problem List Items Addressed This Visit      Other   Screening for cervical cancer    I could not visualize the cervix; will refer to GYN, given her IUD (need to confirm placement) and hx of abnormal pap and no path report on the chart; want to make sure thorough pap collected and IUD string seen/felt      Relevant Orders   Ambulatory referral to Gynecology   Preventative health care - Primary    USPSTF grade A and B recommendations reviewed with patient; age-appropriate recommendations, preventive care, screening tests, etc discussed and encouraged; healthy living encouraged; see AVS for patient education given to patient      IUD check up    Will have GYN confirm IUD still in place; I could neither see nor feel the IUD string today; patient cannot feel it      Relevant Orders   Ambulatory referral to Gynecology    Other Visit Diagnoses    Vaginal discharge       Relevant Orders   WET PREP BY MOLECULAR PROBE (Completed)       Follow up plan: Return in about 1 year (around 08/13/2018) for complete physical.  An after-visit summary was printed and given to the patient at [facility].  Please see the patient instructions which may contain other information and recommendations beyond what is mentioned above in the assessment and plan.  Meds ordered this  encounter  Medications  . Butalbital-Acetaminophen 50-300 MG TABS    Sig: One by mouth every six hours if needed for headache    Dispense:  30 tablet    Refill:  1    Orders Placed This Encounter  Procedures  . WET PREP BY MOLECULAR PROBE  . Ambulatory referral to Gynecology

## 2017-08-14 ENCOUNTER — Other Ambulatory Visit: Payer: Self-pay | Admitting: Family Medicine

## 2017-08-14 LAB — WET PREP BY MOLECULAR PROBE
Candida species: NOT DETECTED
MICRO NUMBER:: 81403506
SPECIMEN QUALITY: ADEQUATE
TRICHOMONAS VAG: NOT DETECTED

## 2017-08-14 MED ORDER — METRONIDAZOLE 500 MG PO TABS: 500.0000 mg | ORAL_TABLET | Freq: Two times a day (BID) | ORAL | 0 refills | Status: DC

## 2017-08-14 NOTE — Progress Notes (Signed)
Metronidazole for BV 

## 2017-08-19 ENCOUNTER — Other Ambulatory Visit: Payer: Self-pay | Admitting: Family Medicine

## 2017-08-24 ENCOUNTER — Encounter: Payer: Self-pay | Admitting: Family Medicine

## 2017-08-28 ENCOUNTER — Encounter: Payer: Self-pay | Admitting: Family Medicine

## 2017-08-28 NOTE — Telephone Encounter (Signed)
Can you please contact patient about the GYN appointment through [facility]? Thank you

## 2017-09-03 ENCOUNTER — Encounter: Payer: Self-pay | Admitting: Family Medicine

## 2017-09-03 ENCOUNTER — Telehealth: Payer: BC Managed Care – PPO | Admitting: Physician Assistant

## 2017-09-03 DIAGNOSIS — R300 Dysuria: Secondary | ICD-10-CM

## 2017-09-03 DIAGNOSIS — N898 Other specified noninflammatory disorders of vagina: Secondary | ICD-10-CM

## 2017-09-03 NOTE — Progress Notes (Signed)
Based on what you shared with me it looks like you have a condition that should be evaluated in a face to face office visit. Some of your symptoms seem consistent with a uti but the thick discharge is not typical and raises concern for vaginal yeast or bacterial overgrowth. As such you need an exam and urine testing.    NOTE: Even if you have entered your credit card information for this eVisit, you will not be charged.   If you are having a true medical emergency please call 911.  If you need an urgent face to face visit, [facility] has four urgent care centers for your convenience.  If you need care fast and have a high deductible or no insurance consider:   [email or web address]  [phone]  [address], [address] , [place] [address] 8 am to 8 pm Monday-Friday 10 am to 4 pm Saturday-Sunday   The following sites will take your  insurance:    . [facility] [place] a Provider at this Location  [address] [place], [place] [address] . 10 am to 8 pm Monday-Friday . 12 pm to 8 pm Saturday-Sunday   . [facility] [place] a Provider at this Location  [address] [place], [place], [place] [address] . 8 am to 8 pm Monday-Friday . 9 am to 6 pm Saturday . 11 am to 6 pm Sunday   . [facility] at [place] Get Driving Directions  [address] [place], [place] [address] . 8 am to 8 pm Monday-Friday . 8 am to 4 pm Saturday-Sunday   Your e-visit answers were reviewed by a board certified advanced clinical practitioner to complete your personal care plan.  Thank you for using e-Visits.

## 2017-09-10 ENCOUNTER — Ambulatory Visit (INDEPENDENT_AMBULATORY_CARE_PROVIDER_SITE_OTHER): Payer: BC Managed Care – PPO | Admitting: Certified Nurse Midwife

## 2017-09-10 ENCOUNTER — Encounter: Payer: Self-pay | Admitting: Certified Nurse Midwife

## 2017-09-10 VITALS — BP 168/74 | HR 78 | Ht 64.0 in | Wt 167.2 lb

## 2017-09-10 DIAGNOSIS — Z124 Encounter for screening for malignant neoplasm of cervix: Secondary | ICD-10-CM

## 2017-09-10 DIAGNOSIS — Z01419 Encounter for gynecological examination (general) (routine) without abnormal findings: Secondary | ICD-10-CM | POA: Diagnosis not present

## 2017-09-10 DIAGNOSIS — R399 Unspecified symptoms and signs involving the genitourinary system: Secondary | ICD-10-CM | POA: Diagnosis not present

## 2017-09-10 LAB — POCT URINALYSIS DIPSTICK
BILIRUBIN UA: NEGATIVE
Blood, UA: NEGATIVE
Glucose, UA: NEGATIVE
Ketones, UA: NEGATIVE
LEUKOCYTES UA: NEGATIVE
PH UA: 6.5 (ref 5.0–8.0)
Spec Grav, UA: >=1.03 — AB (ref 1.010–1.025)
UROBILINOGEN UA: 0.2 U/dL

## 2017-09-10 MED ORDER — NITROFURANTOIN MONOHYD MACRO 100 MG PO CAPS: 100.0000 mg | ORAL_CAPSULE | Freq: Two times a day (BID) | ORAL | 1 refills | Status: DC

## 2017-09-10 NOTE — Progress Notes (Signed)
ANNUAL PREVENTATIVE CARE GYN  ENCOUNTER NOTE  Subjective:       Carrie Cross is a 42 y.o. G1P1 female here for a routine annual gynecologic exam.  Current complaints: 1. Pelvic pressure-new onset 2. Urinary frequency-last few days  Denies difficulty breathing or respiratory distress, fever, chest pain, headache, vaginal bleeding, dysuria, and leg pain or swelling.   PCP: Lada. Pt recently seen and provider unable to visualize IUD or cervix. Her today for IUD check and Pap smear.    Gynecologic History  No LMP recorded. Patient is not currently having periods (Reason: IUD).   Contraception: IUD, Mirena  Last Pap: 08/14/2014. Results were: normal  Last mammogram: 11/2016. Results were: normal  Obstetric History  OB History  Gravida Para Term Preterm AB Living  1 1          SAB TAB Ectopic Multiple Live Births               # Outcome Date GA Lbr Len/2nd Weight Sex Delivery Anes PTL Lv  1 Para 2000    F Vag-Spont         Past Medical History:  Diagnosis Date  . Allergic rhinitis   . Carpal tunnel syndrome   . Chronic migraine   . GERD (gastroesophageal reflux disease)   . Hx of pyelonephritis    as a child  . Hyperlipidemia   . Hypertension    Controlled per pt  . IFG (impaired fasting glucose)   . Irregular menstruation     Past Surgical History:  Procedure Laterality Date  . BACK SURGERY  [id number]   disc shaved down    Current Outpatient Medications on File Prior to Visit  Medication Sig Dispense Refill  . atorvastatin (LIPITOR) 20 MG tablet Take 1 tablet (20 mg total) by mouth at bedtime. 90 tablet 0  . Butalbital-Acetaminophen 50-300 MG TABS One by mouth every six hours if needed for headache 30 tablet 1  . fexofenadine (ALLEGRA) 180 MG tablet Take 1 tablet (180 mg total) by mouth daily as needed for allergies or rhinitis. 90 tablet 3  . fluticasone (FLONASE) 50 MCG/ACT nasal spray Place 2 sprays into both nostrils daily. 48 g 3  . levonorgestrel  (MIRENA) 20 MCG/24HR IUD by Intrauterine route. Reported on 12/13/2015    . metoprolol succinate (TOPROL-XL) 100 MG 24 hr tablet TAKE 1 TABLET DAILY; TAKE  WITH OR IMMEDIATELY        FOLLOWING A MEAL. 90 tablet 3  . Omeprazole 20 MG TBDD Take 20 mg daily by mouth. 90 tablet 1  . ranitidine (ZANTAC) 300 MG tablet TAKE 1 TABLET AT BEDTIME 90 tablet 3  . atorvastatin (LIPITOR) 20 MG tablet Take 1 tablet (20 mg total) by mouth at bedtime. 90 tablet 1  . clobetasol ointment (TEMOVATE) 0.35 % Apply 1 application topically 2 (two) times daily. Apply to affected area (Patient not taking: Reported on 08/13/2017) 30 g 2  . metroNIDAZOLE (FLAGYL) 500 MG tablet Take 1 tablet (500 mg total) by mouth 2 (two) times daily. No alcohol or cold medicines that contain alcohol while on this med (Patient not taking: Reported on 09/10/2017) 14 tablet 0  . valACYclovir (VALTREX) 500 MG tablet Take 1 tablet (500 mg total) by mouth 2 (two) times daily. For three days (for outbreaks) (Patient not taking: Reported on 09/04/2016) 6 tablet 5   No current facility-administered medications on file prior to visit.     No Known Allergies  Social History  Socioeconomic History  . Marital status: Married    Spouse name: Not on file  . Number of children: Not on file  . Years of education: Not on file  . Highest education level: Not on file  Social Needs  . Financial resource strain: Somewhat hard  . Food insecurity - worry: Never true  . Food insecurity - inability: Never true  . Transportation needs - medical: No  . Transportation needs - non-medical: No  Occupational History  . Not on file  Tobacco Use  . Smoking status: Former Smoker    Packs/day: 0.50    Years: 7.00    Pack years: 3.50    Types: Cigarettes    Last attempt to quit: 06/02/2011    Years since quitting: 6.2  . Smokeless tobacco: Never Used  Substance and Sexual Activity  . Alcohol use: Yes    Alcohol/week: 1.8 oz    Types: 3 Glasses of wine per  week  . Drug use: No  . Sexual activity: Yes  Other Topics Concern  . Not on file  Social History Narrative  . Not on file    Family History  Problem Relation Age of Onset  . Breast cancer Maternal Grandmother 80  . Heart disease Maternal Grandfather   . Stroke Neg Hx   . Diabetes Neg Hx     The following portions of the patient's history were reviewed and updated as appropriate: allergies, current medications, past family history, past medical history, past social history, past surgical history and problem list.  Review of Systems  ROS negative except as noted above. Information obtained from patient.    Objective:   BP (!) 186/88   Pulse 78   Ht 5\' 4"  (1.626 m)   Wt 167 lb 3.2 oz (75.8 kg)   BMI 28.70 kg/m    CONSTITUTIONAL: Well-developed, well-nourished female in no acute distress.   PSYCHIATRIC: Normal mood and affect. Normal behavior. Normal judgment and thought content.  [place]: Alert and oriented to person, place, and time. Normal muscle tone coordination. No cranial nerve deficit  noted.  HENT:  Normocephalic, atraumatic, External right and left ear normal. Oropharynx is clear and moist  EYES: Conjunctivae and EOM are normal. Pupils are equal, round, and reactive to light. No scleral icterus.   NECK: Normal range of motion, supple, no masses.  Normal thyroid.   SKIN: Skin is warm and dry. No rash noted. Not diaphoretic. No erythema. No pallor.  CARDIOVASCULAR: Normal heart rate noted, regular rhythm, no murmur.  RESPIRATORY: Clear to auscultation bilaterally. Effort and breath sounds normal, no problems with respiration noted.  BREASTS: Declined by patient, completed by PCP.  ABDOMEN: Soft, normal bowel sounds, no distention noted.  No tenderness, rebound or guarding.   PELVIC:  External Genitalia: Normal  Vagina: Normal  Cervix: Normal, Pap collected, IUD strings visible  Uterus: Normal  Adnexa: Normal   MUSCULOSKELETAL: Normal range of  motion. No tenderness.  No cyanosis, clubbing, or edema.  2+ distal pulses.  LYMPHATIC: No Axillary, Supraclavicular, or Inguinal Adenopathy.  Assessment:   Annual gynecologic examination 42 y.o.   Contraception: IUD, Mirena   Overweight   Problem List Items Addressed This Visit      Other   Screening for cervical cancer   Relevant Orders   PapIG, HPV, rfx 16/18    Other Visit Diagnoses    Well woman exam with routine gynecological exam    -  Primary   Relevant Orders   PapIG, HPV,  rfx 16/18      Plan:   Pap: Pap Co Test, see orders.   Mammogram: Completed by PCP.  Labs: Urine cultures, see orders.   Rx: Macrobid, see orders.   Routine preventative health maintenance measures emphasized: Exercise/Diet/Weight control, Tobacco Warnings, Alcohol/Substance use risks, Stress Management and Peer Pressure Issues.   Reviewed red flag symptoms and when to call.   RTC x 1 year for annual exam or sooner if needed.   Diona Fanti, CNM [facility], [facility]

## 2017-09-10 NOTE — Progress Notes (Signed)
Annual Exam-Patient is concerned about her IUD. She wants to make sure it is in correctly. She also feels she may have a UTI. Has taken AZO with no relief.

## 2017-09-10 NOTE — Patient Instructions (Signed)
Nitrofurantoin tablets or capsules What is this medicine? NITROFURANTOIN (nye troe fyoor AN toyn) is an antibiotic. It is used to treat urinary tract infections. This medicine may be used for other purposes; ask your health care provider or pharmacist if you have questions. COMMON BRAND NAME(S): Macrobid, Macrodantin, Urotoin What should I tell my health care provider before I take this medicine? They need to know if you have any of these conditions: -anemia -diabetes -glucose-6-phosphate dehydrogenase deficiency -kidney disease -liver disease -lung disease -other chronic illness -an unusual or allergic reaction to nitrofurantoin, other antibiotics, other medicines, foods, dyes or preservatives -pregnant or trying to get pregnant -breast-feeding How should I use this medicine? Take this medicine by mouth with a glass of water. Follow the directions on the prescription label. Take this medicine with food or milk. Take your doses at regular intervals. Do not take your medicine more often than directed. Do not stop taking except on your doctor's advice. Talk to your pediatrician regarding the use of this medicine in children. While this drug may be prescribed for selected conditions, precautions do apply. Overdosage: If you think you have taken too much of this medicine contact a poison control center or emergency room at once. NOTE: This medicine is only for you. Do not share this medicine with others. What if I miss a dose? If you miss a dose, take it as soon as you can. If it is almost time for your next dose, take only that dose. Do not take double or extra doses. What may interact with this medicine? -antacids containing magnesium trisilicate -probenecid -quinolone antibiotics like ciprofloxacin, lomefloxacin, norfloxacin and ofloxacin -sulfinpyrazone This list may not describe all possible interactions. Give your health care provider a list of all the medicines, herbs,  non-prescription drugs, or dietary supplements you use. Also tell them if you smoke, drink alcohol, or use illegal drugs. Some items may interact with your medicine. What should I watch for while using this medicine? Tell your doctor or health care professional if your symptoms do not improve or if you get new symptoms. Drink several glasses of water a day. If you are taking this medicine for a long time, visit your doctor for regular checks on your progress. If you are diabetic, you may get a false positive result for sugar in your urine with certain brands of urine tests. Check with your doctor. What side effects may I notice from receiving this medicine? Side effects that you should report to your doctor or health care professional as soon as possible: -allergic reactions like skin rash or hives, swelling of the face, lips, or tongue -chest pain -cough -difficulty breathing -dizziness, drowsiness -fever or infection -joint aches or pains -pale or blue-tinted skin -redness, blistering, peeling or loosening of the skin, including inside the mouth -tingling, burning, pain, or numbness in hands or feet -unusual bleeding or bruising -unusually weak or tired -yellowing of eyes or skin Side effects that usually do not require medical attention (report to your doctor or health care professional if they continue or are bothersome): -dark urine -diarrhea -headache -loss of appetite -nausea or vomiting -temporary hair loss This list may not describe all possible side effects. Call your doctor for medical advice about side effects. You may report side effects to FDA at 1-800-FDA-1088. Where should I keep my medicine? Keep out of the reach of children. Store at room temperature between 15 and 30 degrees C (59 and 86 degrees F). Protect from light. Throw away any unused  medicine after the expiration date. NOTE: This sheet is a summary. It may not cover all possible information. If you have  questions about this medicine, talk to your doctor, pharmacist, or health care provider.  2018 Elsevier/Gold Standard (2008-03-08 15:56:47) Preventive Care 40-64 Years, Female Preventive care refers to lifestyle choices and visits with your health care provider that can promote health and wellness. What does preventive care include?  A yearly physical exam. This is also called an annual well check.  Dental exams once or twice a year.  Routine eye exams. Ask your health care provider how often you should have your eyes checked.  Personal lifestyle choices, including: ? Daily care of your teeth and gums. ? Regular physical activity. ? Eating a healthy diet. ? Avoiding tobacco and drug use. ? Limiting alcohol use. ? Practicing safe sex. ? Taking low-dose aspirin daily starting at age 31. ? Taking vitamin and mineral supplements as recommended by your health care provider. What happens during an annual well check? The services and screenings done by your health care provider during your annual well check will depend on your age, overall health, lifestyle risk factors, and family history of disease. Counseling Your health care provider may ask you questions about your:  Alcohol use.  Tobacco use.  Drug use.  Emotional well-being.  Home and relationship well-being.  Sexual activity.  Eating habits.  Work and work Statistician.  Method of birth control.  Menstrual cycle.  Pregnancy history.  Screening You may have the following tests or measurements:  Height, weight, and BMI.  Blood pressure.  Lipid and cholesterol levels. These may be checked every 5 years, or more frequently if you are over 64 years old.  Skin check.  Lung cancer screening. You may have this screening every year starting at age 34 if you have a 30-pack-year history of smoking and currently smoke or have quit within the past 15 years.  Fecal occult blood test (FOBT) of the stool. You may have  this test every year starting at age 43.  Flexible sigmoidoscopy or colonoscopy. You may have a sigmoidoscopy every 5 years or a colonoscopy every 10 years starting at age 28.  Hepatitis C blood test.  Hepatitis B blood test.  Sexually transmitted disease (STD) testing.  Diabetes screening. This is done by checking your blood sugar (glucose) after you have not eaten for a while (fasting). You may have this done every 1-3 years.  Mammogram. This may be done every 1-2 years. Talk to your health care provider about when you should start having regular mammograms. This may depend on whether you have a family history of breast cancer.  BRCA-related cancer screening. This may be done if you have a family history of breast, ovarian, tubal, or peritoneal cancers.  Pelvic exam and Pap test. This may be done every 3 years starting at age 3. Starting at age 23, this may be done every 5 years if you have a Pap test in combination with an HPV test.  Bone density scan. This is done to screen for osteoporosis. You may have this scan if you are at high risk for osteoporosis.  Discuss your test results, treatment options, and if necessary, the need for more tests with your health care provider. Vaccines Your health care provider may recommend certain vaccines, such as:  Influenza vaccine. This is recommended every year.  Tetanus, diphtheria, and acellular pertussis (Tdap, Td) vaccine. You may need a Td booster every 10 years.  Varicella vaccine.  You may need this if you have not been vaccinated.  Zoster vaccine. You may need this after age 75.  Measles, mumps, and rubella (MMR) vaccine. You may need at least one dose of MMR if you were born in 1957 or later. You may also need a second dose.  Pneumococcal 13-valent conjugate (PCV13) vaccine. You may need this if you have certain conditions and were not previously vaccinated.  Pneumococcal polysaccharide (PPSV23) vaccine. You may need one or two  doses if you smoke cigarettes or if you have certain conditions.  Meningococcal vaccine. You may need this if you have certain conditions.  Hepatitis A vaccine. You may need this if you have certain conditions or if you travel or work in places where you may be exposed to hepatitis A.  Hepatitis B vaccine. You may need this if you have certain conditions or if you travel or work in places where you may be exposed to hepatitis B.  Haemophilus influenzae type b (Hib) vaccine. You may need this if you have certain conditions.  Talk to your health care provider about which screenings and vaccines you need and how often you need them. This information is not intended to replace advice given to you by your health care provider. Make sure you discuss any questions you have with your health care provider. Document Released: 09/14/2015 Document Revised: 05/07/2016 Document Reviewed: 06/19/2015 Elsevier Interactive Patient Education  [employer].

## 2017-09-12 LAB — URINE CULTURE

## 2017-09-15 LAB — PAPIG, HPV, RFX 16/18: PAP SMEAR COMMENT: 0

## 2017-09-15 LAB — HPV, LOW VOLUME (REFLEX): HPV, LOW VOL REFLEX: NEGATIVE

## 2017-09-18 ENCOUNTER — Other Ambulatory Visit: Payer: Self-pay

## 2017-09-18 ENCOUNTER — Other Ambulatory Visit: Payer: Self-pay | Admitting: Certified Nurse Midwife

## 2017-09-18 ENCOUNTER — Encounter: Payer: Self-pay | Admitting: Certified Nurse Midwife

## 2017-09-18 ENCOUNTER — Other Ambulatory Visit (INDEPENDENT_AMBULATORY_CARE_PROVIDER_SITE_OTHER): Payer: BC Managed Care – PPO

## 2017-09-18 DIAGNOSIS — R309 Painful micturition, unspecified: Secondary | ICD-10-CM | POA: Diagnosis not present

## 2017-09-18 LAB — POCT URINALYSIS DIPSTICK
BILIRUBIN UA: NEGATIVE
Glucose, UA: NEGATIVE
KETONES UA: NEGATIVE
Nitrite, UA: NEGATIVE
Odor: NEGATIVE
PH UA: 7 (ref 5.0–8.0)
SPEC GRAV UA: 1.015 (ref 1.010–1.025)
UROBILINOGEN UA: 0.2 U/dL

## 2017-09-18 MED ORDER — URIBEL 118 MG PO CAPS: 118.0000 mg | ORAL_CAPSULE | Freq: Four times a day (QID) | ORAL | 0 refills | Status: DC

## 2017-09-20 LAB — URINE CULTURE

## 2017-09-22 ENCOUNTER — Other Ambulatory Visit: Payer: Self-pay

## 2017-09-22 ENCOUNTER — Encounter: Payer: Self-pay | Admitting: Certified Nurse Midwife

## 2017-09-22 MED ORDER — AMPICILLIN 250 MG PO CAPS: 250.0000 mg | ORAL_CAPSULE | Freq: Four times a day (QID) | ORAL | 0 refills | Status: DC

## 2017-09-23 ENCOUNTER — Other Ambulatory Visit: Payer: Self-pay

## 2017-09-23 MED ORDER — AMOXICILLIN-POT CLAVULANATE 875-125 MG PO TABS: 1.0000 | ORAL_TABLET | Freq: Two times a day (BID) | ORAL | 0 refills | Status: DC

## 2017-12-03 ENCOUNTER — Other Ambulatory Visit: Payer: Self-pay | Admitting: Family Medicine

## 2017-12-30 ENCOUNTER — Other Ambulatory Visit: Payer: Self-pay | Admitting: Family Medicine

## 2017-12-30 DIAGNOSIS — Z1231 Encounter for screening mammogram for malignant neoplasm of breast: Secondary | ICD-10-CM

## 2018-01-02 ENCOUNTER — Encounter: Payer: Self-pay | Admitting: Family Medicine

## 2018-01-11 ENCOUNTER — Ambulatory Visit: Payer: BC Managed Care – PPO | Admitting: Family Medicine

## 2018-01-20 ENCOUNTER — Ambulatory Visit
Admission: RE | Admit: 2018-01-20 | Discharge: 2018-01-20 | Disposition: A | Discharge status: Home / Self Care | Payer: BC Managed Care – PPO | Source: Ambulatory Visit | Attending: Family Medicine | Admitting: Family Medicine

## 2018-01-20 DIAGNOSIS — Z1231 Encounter for screening mammogram for malignant neoplasm of breast: Secondary | ICD-10-CM | POA: Diagnosis not present

## 2018-01-21 ENCOUNTER — Other Ambulatory Visit: Payer: Self-pay | Admitting: Family Medicine

## 2018-01-21 DIAGNOSIS — R928 Other abnormal and inconclusive findings on diagnostic imaging of breast: Secondary | ICD-10-CM

## 2018-01-21 DIAGNOSIS — N6320 Unspecified lump in the left breast, unspecified quadrant: Secondary | ICD-10-CM

## 2018-01-21 DIAGNOSIS — N6310 Unspecified lump in the right breast, unspecified quadrant: Secondary | ICD-10-CM

## 2018-01-21 NOTE — Progress Notes (Signed)
Orders entered for BILAT diag mammo and L and R [place]  Orders Placed This Encounter  Procedures  . MM DIAG BREAST TOMO BILATERAL    Standing Status:   Future    Standing Expiration Date:   03/23/2018    Order Specific Question:   Reason for Exam (SYMPTOM  OR DIAGNOSIS REQUIRED)    Answer:   abnormal mammo bilaterally    Order Specific Question:   Is the patient pregnant?    Answer:   No    Order Specific Question:   Preferred imaging location?    Answer:   [facility]  . US BREAST LTD UNI LEFT INC AXILLA    Standing Status:   Future    Standing Expiration Date:   03/23/2018    Order Specific Question:   Reason for Exam (SYMPTOM  OR DIAGNOSIS REQUIRED)    Answer:   abnormal mammo bilaterally    Order Specific Question:   Preferred imaging location?    Answer:   [facility]  . US BREAST LTD UNI RIGHT INC AXILLA    Standing Status:   Future    Standing Expiration Date:   03/23/2018    Order Specific Question:   Reason for Exam (SYMPTOM  OR DIAGNOSIS REQUIRED)    Answer:   abnormal mammo bilaterally    Order Specific Question:   Preferred imaging location?    Answer:   [facility]

## 2018-02-02 ENCOUNTER — Other Ambulatory Visit: Payer: Self-pay | Admitting: Family Medicine

## 2018-02-03 ENCOUNTER — Ambulatory Visit
Admission: RE | Admit: 2018-02-03 | Discharge: 2018-02-03 | Disposition: A | Discharge status: Home / Self Care | Payer: BC Managed Care – PPO | Source: Ambulatory Visit | Attending: Family Medicine | Admitting: Family Medicine

## 2018-02-03 DIAGNOSIS — R928 Other abnormal and inconclusive findings on diagnostic imaging of breast: Secondary | ICD-10-CM | POA: Diagnosis not present

## 2018-02-08 ENCOUNTER — Encounter: Payer: Self-pay | Admitting: Family Medicine

## 2018-02-08 ENCOUNTER — Ambulatory Visit: Payer: BC Managed Care – PPO | Admitting: Family Medicine

## 2018-02-08 VITALS — BP 120/78 | HR 78 | Temp 98.3°F | Resp 12 | Ht 64.0 in | Wt 161.5 lb

## 2018-02-08 DIAGNOSIS — Z1231 Encounter for screening mammogram for malignant neoplasm of breast: Secondary | ICD-10-CM

## 2018-02-08 DIAGNOSIS — I10 Essential (primary) hypertension: Secondary | ICD-10-CM

## 2018-02-08 DIAGNOSIS — R5383 Other fatigue: Secondary | ICD-10-CM | POA: Diagnosis not present

## 2018-02-08 DIAGNOSIS — G4700 Insomnia, unspecified: Secondary | ICD-10-CM | POA: Diagnosis not present

## 2018-02-08 DIAGNOSIS — R7301 Impaired fasting glucose: Secondary | ICD-10-CM | POA: Diagnosis not present

## 2018-02-08 DIAGNOSIS — E663 Overweight: Secondary | ICD-10-CM | POA: Diagnosis not present

## 2018-02-08 DIAGNOSIS — Z5181 Encounter for therapeutic drug level monitoring: Secondary | ICD-10-CM | POA: Diagnosis not present

## 2018-02-08 DIAGNOSIS — E782 Mixed hyperlipidemia: Secondary | ICD-10-CM | POA: Diagnosis not present

## 2018-02-08 DIAGNOSIS — Z1239 Encounter for other screening for malignant neoplasm of breast: Secondary | ICD-10-CM

## 2018-02-08 NOTE — Assessment & Plan Note (Signed)
Check A1c today; glucose is NON-fasting

## 2018-02-08 NOTE — Assessment & Plan Note (Signed)
Reviewed last negative mammogram; next due in one year

## 2018-02-08 NOTE — Assessment & Plan Note (Addendum)
Controlled today; try to limit salt 

## 2018-02-08 NOTE — Patient Instructions (Addendum)
Try the melatonin 3 mg at the exact same time of night for 3 weeks  See measures below Avoid caffeine, tea, and chocolate after noon If these measures don't help, try cognitive behavioral therapy for insomnia (CBT-I), and I can get you a list of providers  Try to limit saturated fats in your diet (bologna, hot dogs, barbeque, cheeseburgers, hamburgers, steak, bacon, sausage, cheese, etc.) and get more fresh fruits, vegetables, and whole grains Try to leave cows and pigs off the plate  Return fasting for labs this week If you have not heard anything from my staff in a week about any orders/referrals/studies from today, please contact us here to follow-up [phone]  Insomnia Insomnia is a sleep disorder that makes it difficult to fall asleep or to stay asleep. Insomnia can cause tiredness (fatigue), low energy, difficulty concentrating, mood swings, and poor performance at work or school. There are three different ways to classify insomnia:  Difficulty falling asleep.  Difficulty staying asleep.  Waking up too early in the morning.  Any type of insomnia can be long-term (chronic) or short-term (acute). Both are common. Short-term insomnia usually lasts for three months or less. Chronic insomnia occurs at least three times a week for longer than three months. What are the causes? Insomnia may be caused by another condition, situation, or substance, such as:  Anxiety.  Certain medicines.  Gastroesophageal reflux disease (GERD) or other gastrointestinal conditions.  Asthma or other breathing conditions.  Restless legs syndrome, sleep apnea, or other sleep disorders.  Chronic pain.  Menopause. This may include hot flashes.  Stroke.  Abuse of alcohol, tobacco, or illegal drugs.  Depression.  Caffeine.  Neurological disorders, such as Alzheimer disease.  An overactive thyroid (hyperthyroidism).  The cause of insomnia may not be known. What increases the risk? Risk  factors for insomnia include:  Gender. Women are more commonly affected than men.  Age. Insomnia is more common as you get older.  Stress. This may involve your professional or personal life.  Income. Insomnia is more common in people with lower income.  Lack of exercise.  Irregular work schedule or night shifts.  Traveling between different time zones.  What are the signs or symptoms? If you have insomnia, trouble falling asleep or trouble staying asleep is the main symptom. This may lead to other symptoms, such as:  Feeling fatigued.  Feeling nervous about going to sleep.  Not feeling rested in the morning.  Having trouble concentrating.  Feeling irritable, anxious, or depressed.  How is this treated? Treatment for insomnia depends on the cause. If your insomnia is caused by an underlying condition, treatment will focus on addressing the condition. Treatment may also include:  Medicines to help you sleep.  Counseling or therapy.  Lifestyle adjustments.  Follow these instructions at home:  Take medicines only as directed by your health care provider.  Keep regular sleeping and waking hours. Avoid naps.  Keep a sleep diary to help you and your health care provider figure out what could be causing your insomnia. Include: ? When you sleep. ? When you wake up during the night. ? How well you sleep. ? How rested you feel the next day. ? Any side effects of medicines you are taking. ? What you eat and drink.  Make your bedroom a comfortable place where it is easy to fall asleep: ? Put up shades or special blackout curtains to block light from outside. ? Use a white noise machine to block noise. ?  Keep the temperature cool.  Exercise regularly as directed by your health care provider. Avoid exercising right before bedtime.  Use relaxation techniques to manage stress. Ask your health care provider to suggest some techniques that may work well for you. These may  include: ? Breathing exercises. ? Routines to release muscle tension. ? Visualizing peaceful scenes.  Cut back on alcohol, caffeinated beverages, and cigarettes, especially close to bedtime. These can disrupt your sleep.  Do not overeat or eat spicy foods right before bedtime. This can lead to digestive discomfort that can make it hard for you to sleep.  Limit screen use before bedtime. This includes: ? Watching TV. ? Using your smartphone, tablet, and computer.  Stick to a routine. This can help you fall asleep faster. Try to do a quiet activity, brush your teeth, and go to bed at the same time each night.  Get out of bed if you are still awake after 15 minutes of trying to sleep. Keep the lights down, but try reading or doing a quiet activity. When you feel sleepy, go back to bed.  Make sure that you drive carefully. Avoid driving if you feel very sleepy.  Keep all follow-up appointments as directed by your health care provider. This is important. Contact a health care provider if:  You are tired throughout the day or have trouble in your daily routine due to sleepiness.  You continue to have sleep problems or your sleep problems get worse. Get help right away if:  You have serious thoughts about hurting yourself or someone else. This information is not intended to replace advice given to you by your health care provider. Make sure you discuss any questions you have with your health care provider. Document Released: 08/15/2000 Document Revised: 01/18/2016 Document Reviewed: 05/19/2014 Elsevier Interactive Patient Education  [employer].

## 2018-02-08 NOTE — Assessment & Plan Note (Signed)
Praise given for weight loss and healthier eating

## 2018-02-08 NOTE — Assessment & Plan Note (Signed)
Check fasting lipids on another day

## 2018-02-08 NOTE — Progress Notes (Signed)
BP 120/78   Pulse 78   Temp 98.3 F (36.8 C) (Oral)   Resp 12   Ht 5\' 4"  (1.626 m)   Wt 161 lb 8 oz (73.3 kg)   SpO2 98%   BMI 27.72 kg/m    Subjective:    Patient ID: Carrie Cross, female    DOB: [birth year], 42 y.o.   MRN: [id number]  HPI: Carrie Cross is a 42 y.o. female  Chief Complaint  Patient presents with  . Follow-up  . Insomnia    pt states having trouble staing asleep and has tried otc meds    HPI Patient is here for follow-up  HTN; controlled here today; she has been getting shots at [facility] and BP has been high there; occasionally adding salt; she has pain on the right side of the neck; wonders if causing her headaches; going across both sides; not sure if arthritis; going to [facility] next week for that; she has had one injection in the neck and one in the back  Prediabetes; no one in the family with diabetes; no dry mouth; no blurred vision; not craving sweets Lab Results  Component Value Date   HGBA1C 5.8 (H) 07/13/2017   High cholesterol; taking medicine; no bad muscle aches; eats bacon; not running in the family Lab Results  Component Value Date   CHOL 135 07/13/2017   HDL 63 07/13/2017   LDLCALC 63 07/13/2017   TRIG 33 07/13/2017   CHOLHDL 2.1 07/13/2017   Insomnia; she tried melatonin once she is starting to sit on the bed; she can fall asleep but cannot stay asleep; hard to go back to sleep; she has never had a set time to go to sleep  Some fatigue; no periods; last CBC normal  Depression screen [facility] 2/9 02/08/2018 08/13/2017 07/13/2017 09/04/2016 07/10/2016  Decreased Interest 0 0 0 0 0  Down, Depressed, Hopeless 0 0 0 0 0  PHQ - 2 Score 0 0 0 0 0    Relevant past medical, surgical, family and social history reviewed Past Medical History:  Diagnosis Date  . Allergic rhinitis   . Carpal tunnel syndrome   . Chronic migraine   . GERD (gastroesophageal reflux disease)   . Hx of pyelonephritis    as a child  . Hyperlipidemia     . Hypertension    Controlled per pt  . IFG (impaired fasting glucose)   . Irregular menstruation    Past Surgical History:  Procedure Laterality Date  . BACK SURGERY  [id number]   disc shaved down   Family History  Problem Relation Age of Onset  . Breast cancer Maternal Grandmother 80  . Heart disease Maternal Grandfather   . Stroke Neg Hx   . Diabetes Neg Hx    Social History   Tobacco Use  . Smoking status: Former Smoker    Packs/day: 0.50    Years: 7.00    Pack years: 3.50    Types: Cigarettes    Last attempt to quit: 06/02/2011    Years since quitting: 6.6  . Smokeless tobacco: Never Used  Substance Use Topics  . Alcohol use: Yes    Alcohol/week: 1.8 oz    Types: 3 Glasses of wine per week  . Drug use: No    Interim medical history since last visit reviewed. Allergies and medications reviewed  Review of Systems Per HPI unless specifically indicated above     Objective:    BP 120/78   Pulse 78  Temp 98.3 F (36.8 C) (Oral)   Resp 12   Ht 5\' 4"  (1.626 m)   Wt 161 lb 8 oz (73.3 kg)   SpO2 98%   BMI 27.72 kg/m   Wt Readings from Last 3 Encounters:  02/08/18 161 lb 8 oz (73.3 kg)  09/10/17 167 lb 3.2 oz (75.8 kg)  08/13/17 166 lb 3.2 oz (75.4 kg)    Physical Exam  Constitutional: She appears well-developed and well-nourished. No distress.  HENT:  Head: Normocephalic and atraumatic.  Eyes: EOM are normal. No scleral icterus.  Neck: No thyromegaly present.  Cardiovascular: Normal rate, regular rhythm and normal heart sounds.  No murmur heard. Pulmonary/Chest: Effort normal and breath sounds normal. No respiratory distress. She has no wheezes.  Abdominal: Soft. Bowel sounds are normal. She exhibits no distension.  Musculoskeletal: Normal range of motion. She exhibits no edema.  Neurological: She is alert. She exhibits normal muscle tone.  Skin: Skin is warm and dry. She is not diaphoretic. No pallor.  Psychiatric: She has a normal mood and affect.  Her behavior is normal. Judgment and thought content normal.       Assessment & Plan:   Problem List Items Addressed This Visit      Cardiovascular and Mediastinum   Hypertension - Primary    Controlled today; try to limit salt        Endocrine   IFG (impaired fasting glucose)    Check A1c today; glucose is NON-fasting      Relevant Orders   Hemoglobin A1c     Other   Medication monitoring encounter   Relevant Orders   COMPLETE METABOLIC PANEL WITH GFR   Overweight (BMI 25.0-29.9)    Praise given for weight loss and healthier eating      Hyperlipidemia    Check fasting lipids on another day      Relevant Orders   Lipid panel   Breast cancer screening    Reviewed last negative mammogram; next due in one year       Other Visit Diagnoses    Other fatigue       Relevant Orders   VITAMIN D 25 Hydroxy (Vit-D Deficiency, Fractures)   TSH   Insomnia, unspecified type       sleep hygiene discussed; avoid methylxanthines for 10 hours prior to bed; consider CBT-I if melatonin x 3 weeks at exact same time no effective       Follow up plan: Return in about 6 months (around 08/10/2018) for follow-up visit with Dr. Sanda Klein.  An after-visit summary was printed and given to the patient at [facility].  Please see the patient instructions which may contain other information and recommendations beyond what is mentioned above in the assessment and plan.  No orders of the defined types were placed in this encounter.   Orders Placed This Encounter  Procedures  . COMPLETE METABOLIC PANEL WITH GFR  . Hemoglobin A1c  . Lipid panel  . VITAMIN D 25 Hydroxy (Vit-D Deficiency, Fractures)  . TSH

## 2018-02-11 LAB — TSH: TSH: 0.84 mIU/L

## 2018-02-11 LAB — COMPLETE METABOLIC PANEL WITH GFR
AG Ratio: 1.8 (calc) (ref 1.0–2.5)
ALT: 18 U/L (ref 6–29)
AST: 14 U/L (ref 10–30)
Albumin: 4.4 g/dL (ref 3.6–5.1)
Alkaline phosphatase (APISO): 69 U/L (ref 33–115)
BUN: 15 mg/dL (ref 7–25)
CALCIUM: 9.4 mg/dL (ref 8.6–10.2)
CO2: 27 mmol/L (ref 20–32)
CREATININE: 0.63 mg/dL (ref 0.50–1.10)
Chloride: 103 mmol/L (ref 98–110)
GFR, EST NON AFRICAN AMERICAN: 111 mL/min/{1.73_m2} (ref >=60)
GFR, Est African American: 129 mL/min/{1.73_m2} (ref >=60)
Globulin: 2.4 g/dL (calc) (ref 1.9–3.7)
Glucose, Bld: 101 mg/dL — ABNORMAL HIGH (ref 65–99)
Potassium: 3.8 mmol/L (ref 3.5–5.3)
Sodium: 139 mmol/L (ref 135–146)
Total Bilirubin: 0.5 mg/dL (ref 0.2–1.2)
Total Protein: 6.8 g/dL (ref 6.1–8.1)

## 2018-02-11 LAB — LIPID PANEL
CHOL/HDL RATIO: 2.3 (calc) (ref <5.0)
Cholesterol: 140 mg/dL (ref <200)
HDL: 60 mg/dL (ref >50)
LDL CHOLESTEROL (CALC): 66 mg/dL
NON-HDL CHOLESTEROL (CALC): 80 mg/dL (ref <130)
TRIGLYCERIDES: 48 mg/dL (ref <150)

## 2018-02-11 LAB — HEMOGLOBIN A1C
Hgb A1c MFr Bld: 5.8 % of total Hgb — ABNORMAL HIGH (ref <5.7)
MEAN PLASMA GLUCOSE: 120 (calc)
eAG (mmol/L): 6.6 (calc)

## 2018-02-11 LAB — VITAMIN D 25 HYDROXY (VIT D DEFICIENCY, FRACTURES): Vit D, 25-Hydroxy: 38 ng/mL (ref 30–100)

## 2018-02-15 ENCOUNTER — Other Ambulatory Visit: Payer: Self-pay | Admitting: Family Medicine

## 2018-02-17 ENCOUNTER — Encounter: Payer: Self-pay | Admitting: Family Medicine

## 2018-05-08 ENCOUNTER — Other Ambulatory Visit: Payer: Self-pay | Admitting: Family Medicine

## 2018-05-09 NOTE — Telephone Encounter (Signed)
Decrease PPI somewhat; note for pt to contact doctor if sx worsen

## 2018-06-09 ENCOUNTER — Other Ambulatory Visit: Payer: Self-pay | Admitting: Family Medicine

## 2018-07-08 ENCOUNTER — Telehealth: Payer: BC Managed Care – PPO | Admitting: Family Medicine

## 2018-07-08 DIAGNOSIS — J029 Acute pharyngitis, unspecified: Secondary | ICD-10-CM

## 2018-07-08 DIAGNOSIS — J019 Acute sinusitis, unspecified: Secondary | ICD-10-CM

## 2018-07-08 DIAGNOSIS — B9789 Other viral agents as the cause of diseases classified elsewhere: Secondary | ICD-10-CM

## 2018-07-08 MED ORDER — IPRATROPIUM BROMIDE 0.03 % NA SOLN: 2.0000 | Freq: Three times a day (TID) | NASAL | 12 refills | Status: DC

## 2018-07-08 NOTE — Progress Notes (Signed)
We are sorry that you are not feeling well.  Here is how we plan to help!  Based on what you have shared with me it looks like you have sinusitis.  Sinusitis is inflammation and infection in the sinus cavities of the head.  Based on your presentation I believe you most likely have Acute Viral Sinusitis.This is an infection most likely caused by a virus. There is not specific treatment for viral sinusitis other than to help you with the symptoms until the infection runs its course.  You may use an oral decongestant such as Mucinex D or if you have glaucoma or high blood pressure use plain Mucinex. Saline nasal spray help and can safely be used as often as needed for congestion, I have prescribed: Ipratropium Bromide nasal spray 0.03% 2 sprays in eah nostril 2-3 times a day  Some authorities believe that zinc sprays or the use of Echinacea may shorten the course of your symptoms.  Based on what you have shared with me it looks like you have a condition called pharyngitis.  Pharyngitis is inflammation in the back of the throat which can cause a sore throat, scratchiness and sometimes difficulty swallowing.   Pharyngitis is typically caused by a respiratory virus and will just run its course.  Please keep in mind that your symptoms could last up to 10 days.  For throat pain, we recommend over the counter oral pain relief medications such as acetaminophen or aspirin, or anti-inflammatory medications such as ibuprofen or naproxen sodium.  Topical treatments such as oral throat lozenges or sprays may be used as needed.  Avoid close contact with loved ones, especially the very young and elderly.  Remember to wash your hands thoroughly throughout the day as this is the number one way tot prevent the spread of infection and wipe down door knobs and counters with disinfectant.  After careful review of your answers, I would not recommend and antibiotic for your condition.  Antibiotics should not be used to treat  conditions that we suspect are caused by viruses like the virus that causes the common cold or flu.  Providers prescribe antibiotics to treat infections caused by bacteria. Antibiotics are very powerful in treating bacterial infections when they are used properly.  To maintain their effectiveness, they should be used only when necessary.  Overuse of antibiotics has resulted in the development of super bugs that are resistant to treatment!    If you feel that you need an antibiotic or additional care/treatment please seek care face to face for comprehensive evaluation.  Sinus infections are not as easily transmitted as other respiratory infection, however we still recommend that you avoid close contact with loved ones, especially the very young and elderly.  Remember to wash your hands thoroughly throughout the day as this is the number one way to prevent the spread of infection!  Home Care:  Only take medications as instructed by your medical team.  Do not take these medications with alcohol.  A steam or ultrasonic humidifier can help congestion.  You can place a towel over your head and breathe in the steam from hot water coming from a faucet.  Avoid close contacts especially the very young and the elderly.  Cover your mouth when you cough or sneeze.  Always remember to wash your hands.  Get Help Right Away If:  You develop worsening fever or sinus pain.  You develop a severe head ache or visual changes.  Your symptoms persist after you have  completed your treatment plan.  Make sure you  Understand these instructions.  Will watch your condition.  Will get help right away if you are not doing well or get worse.  Home Care:  Only take medications as instructed by your medical team.  Do not drink alcohol while taking these medications.  A steam or ultrasonic humidifier can help congestion.  You can place a towel over your head and breathe in the steam from hot water coming  from a faucet.  Avoid close contacts especially the very young and the elderly.  Cover your mouth when you cough or sneeze.  Always remember to wash your hands.  Get Help Right Away If:  You develop worsening fever or throat pain.  You develop a severe head ache or visual changes.  Your symptoms persist after you have completed your treatment plan.  Make sure you  Understand these instructions.  Will watch your condition.  Will get help right away if you are not doing well or get worse.  Your e-visit answers were reviewed by a board certified advanced clinical practitioner to complete your personal care plan.  Depending on the condition, your plan could have included both over the counter or prescription medications.  If there is a problem please reply  once you have received a response from your provider.  Your safety is important to [place].  If you have drug allergies check your prescription carefully.    You can use MyChart to ask questions about todays visit, request a non-urgent call back, or ask for a work or school excuse for 24 hours related to this e-Visit. If it has been greater than 24 hours you will need to follow up with your provider, or enter a new e-Visit to address those concerns.  You will get an e-mail in the next two days asking about your experience.  I hope that your e-visit has been valuable and will speed your recovery. Thank you for using e-visits.

## 2018-09-13 ENCOUNTER — Ambulatory Visit (INDEPENDENT_AMBULATORY_CARE_PROVIDER_SITE_OTHER): Payer: BC Managed Care – PPO | Admitting: Certified Nurse Midwife

## 2018-09-13 ENCOUNTER — Encounter: Payer: Self-pay | Admitting: Certified Nurse Midwife

## 2018-09-13 VITALS — BP 142/92 | HR 83 | Ht 64.0 in | Wt 166.6 lb

## 2018-09-13 DIAGNOSIS — Z975 Presence of (intrauterine) contraceptive device: Secondary | ICD-10-CM

## 2018-09-13 DIAGNOSIS — Z01419 Encounter for gynecological examination (general) (routine) without abnormal findings: Secondary | ICD-10-CM

## 2018-09-13 NOTE — Progress Notes (Signed)
Patient here for annual exam, c/o Mirena IUD expires at the end of this month.

## 2018-09-13 NOTE — Progress Notes (Signed)
ANNUAL PREVENTATIVE CARE GYN  ENCOUNTER NOTE  Subjective:       Carrie Cross is a 43 y.o. G51P0101 female here for a routine annual gynecologic exam.  Current complaints: 1. Needs Mirena removal and reinsertion  Denies difficulty breathing or respiratory distress, chest pain, abdominal pain, vaginal bleeding, dysuria, and leg pain or swelling.    Gynecologic History  No LMP recorded. (Menstrual status: IUD).  Contraception: IUD, Mirena. Expires at end of month.   Last Pap: 09/10/2017. Results were: Negative/Negative  Last mammogram: 02/03/2018. Results were: Negative, BI-RADS 1  Obstetric History  OB History  Gravida Para Term Preterm AB Living  1 1   1   1   SAB TAB Ectopic Multiple Live Births          1    # Outcome Date GA Lbr Len/2nd Weight Sex Delivery Anes PTL Lv  1 Preterm 12/06/98   3 lb (1.361 kg) F Vag-Spont   LIV    Past Medical History:  Diagnosis Date  . Allergic rhinitis   . Carpal tunnel syndrome   . Chronic migraine   . GERD (gastroesophageal reflux disease)   . Hx of pyelonephritis    as a child  . Hyperlipidemia   . Hypertension    Controlled per pt  . IFG (impaired fasting glucose)   . Irregular menstruation     Past Surgical History:  Procedure Laterality Date  . BACK SURGERY  [id number]   disc shaved down    Current Outpatient Medications on File Prior to Visit  Medication Sig Dispense Refill  . atorvastatin (LIPITOR) 20 MG tablet TAKE 1 TABLET AT BEDTIME 90 tablet 3  . Butalbital-Acetaminophen 50-300 MG TABS One by mouth every six hours if needed for headache 30 tablet 1  . fluticasone (FLONASE) 50 MCG/ACT nasal spray USE 2 SPRAYS IN EACH       NOSTRIL DAILY 48 g 3  . ipratropium (ATROVENT) 0.03 % nasal spray Place 2 sprays into both nostrils 3 (three) times daily. 30 mL 12  . levonorgestrel (MIRENA) 20 MCG/24HR IUD by Intrauterine route. Reported on 12/13/2015    . metoprolol succinate (TOPROL-XL) 100 MG 24 hr tablet TAKE 1 TABLET  DAILY; TAKE  WITH OR IMMEDIATELY        FOLLOWING A MEAL. 90 tablet 3  . omeprazole (PRILOSEC) 20 MG capsule One by mouth daily, but skip Wednesdays and Sundays; if symptoms worsen, contact doctor 70 capsule 0  . ranitidine (ZANTAC) 300 MG tablet TAKE 1 TABLET AT BEDTIME 90 tablet 3   No current facility-administered medications on file prior to visit.     No Known Allergies  Social History   Socioeconomic History  . Marital status: Married    Spouse name: Not on file  . Number of children: Not on file  . Years of education: Not on file  . Highest education level: Not on file  Occupational History  . Not on file  Social Needs  . Financial resource strain: Somewhat hard  . Food insecurity:    Worry: Never true    Inability: Never true  . Transportation needs:    Medical: No    Non-medical: No  Tobacco Use  . Smoking status: Former Smoker    Packs/day: 0.50    Years: 7.00    Pack years: 3.50    Types: Cigarettes    Last attempt to quit: 06/02/2011    Years since quitting: 7.2  . Smokeless tobacco: Never Used  Substance  and Sexual Activity  . Alcohol use: Yes    Alcohol/week: 3.0 standard drinks    Types: 3 Glasses of wine per week  . Drug use: No  . Sexual activity: Yes    Birth control/protection: I.U.D.  Lifestyle  . Physical activity:    Days per week: 3 days    Minutes per session: 90 min  . Stress: Only a little  Relationships  . Social connections:    Talks on phone: Not on file    Gets together: Not on file    Attends religious service: Not on file    Active member of club or organization: Not on file    Attends meetings of clubs or organizations: Not on file    Relationship status: Married  . Intimate partner violence:    Fear of current or ex partner: No    Emotionally abused: No    Physically abused: No    Forced sexual activity: No  Other Topics Concern  . Not on file  Social History Narrative  . Not on file    Family History  Problem  Relation Age of Onset  . Breast cancer Maternal Grandmother 80  . Heart disease Maternal Grandfather   . Stroke Neg Hx   . Diabetes Neg Hx   . Ovarian cancer Neg Hx   . Colon cancer Neg Hx     The following portions of the patient's history were reviewed and updated as appropriate: allergies, current medications, past family history, past medical history, past social history, past surgical history and problem list.  Review of Systems  ROS negative except as noted above. Information obtained from patient.    Objective:   BP (!) 142/92   Pulse 83   Ht 5\' 4"  (1.626 m)   Wt 166 lb 9.6 oz (75.6 kg)   BMI 28.60 kg/m    CONSTITUTIONAL: Well-developed, well-nourished female in no acute distress.   PSYCHIATRIC: Normal mood and affect. Normal behavior. Normal judgment and thought content.  [place]: Alert and oriented to person, place, and time. Normal muscle tone coordination. No cranial nerve deficit noted.  HENT:  Normocephalic, atraumatic, External right and left ear normal.   EYES: Conjunctivae and EOM are normal. Pupils are equal and round.   NECK: Normal range of motion, supple, no masses.  Normal thyroid.   SKIN: Skin is warm and dry. No rash noted. Not diaphoretic. No erythema. No pallor.  CARDIOVASCULAR: Normal heart rate noted, regular rhythm, no murmur.  RESPIRATORY: Clear to auscultation bilaterally. Effort and breath sounds normal, no problems with respiration noted.  BREASTS: Symmetric in size. No masses, skin changes, nipple drainage, or lymphadenopathy.  ABDOMEN: Soft, normal bowel sounds, no distention noted.  No tenderness, rebound or guarding. Overweight.   PELVIC:  External Genitalia: Normal  BUS: Normal  Vagina: Normal  Cervix: Normal, No CMT  Uterus: Normal  Adnexa: Normal   MUSCULOSKELETAL: Normal range of motion. No tenderness.  No cyanosis, clubbing, or edema.  2+ distal pulses.  LYMPHATIC: No Axillary, Supraclavicular, or Inguinal  Adenopathy.  Assessment:   Annual gynecologic examination 43 y.o.   Contraception: IUD, Mirena   Overweight   Problem List Items Addressed This Visit    None    Visit Diagnoses    Well woman exam    -  Primary   IUD (intrauterine device) in place          Plan:   Pap: Not needed  Labs: by PCP   Routine preventative health  maintenance measures emphasized: Exercise/Diet/Weight control, Tobacco Warnings, Alcohol/Substance use risks and Stress Management; see AVS  Reviewed red flag symptoms and when to call  RTC x 2 weeks for Mirena removal and reinsertion  RTC x 1 year for ANNUAL EXAM or sooner if needed   Diona Fanti, CNM [facility], [facility] 09/13/18 3:34 PM

## 2018-09-13 NOTE — Patient Instructions (Addendum)
WE WOULD LOVE TO HEAR FROM YOU!!!!   Thank you Carrie Cross for visiting [facility].  Providing our patients with the best experience possible is really important to [place], and we hope that you felt that on your recent visit. The most valuable feedback we get comes from [place]!!    If you receive a survey please take a couple of minutes to let us know how we did.Thank you for continuing to trust [place] with your care.   [facility]    IUD PLACEMENT POST-PROCEDURE INSTRUCTIONS  1. You may take Ibuprofen, Aleve or Tylenol for pain if needed.  Cramping should resolve within in 24 hours.  2. You may have a small amount of spotting.  You should wear a mini pad for the next few days.  3. You may have intercourse after 72 hours.  If you using this for birth control, it is effective immediately.  4. You need to call if you have any pelvic pain, fever, heavy bleeding or foul smelling vaginal discharge.  Irregular bleeding is common the first several months after having an IUD placed. You do not need to call for this reason unless you are concerned.  5. Shower or bathe as normal  You should have a follow-up appointment in 4-8 weeks for a re-check to make sure you are not having any problems.Levonorgestrel intrauterine device (IUD) What is this medicine? LEVONORGESTREL IUD (LEE voe nor jes trel) is a contraceptive (birth control) device. The device is placed inside the uterus by a healthcare professional. It is used to prevent pregnancy. This device can also be used to treat heavy bleeding that occurs during your period. This medicine may be used for other purposes; ask your health care provider or pharmacist if you have questions. COMMON BRAND NAME(S): Minette Headland What should I tell my health care provider before I take this medicine? They need to know if you have any of these conditions: -abnormal Pap smear -cancer of the breast, uterus, or  cervix -diabetes -endometritis -genital or pelvic infection now or in the past -have more than one sexual partner or your partner has more than one partner -heart disease -history of an ectopic or tubal pregnancy -immune system problems -IUD in place -liver disease or tumor -problems with blood clots or take blood-thinners -seizures -use intravenous drugs -uterus of unusual shape -vaginal bleeding that has not been explained -an unusual or allergic reaction to levonorgestrel, other hormones, silicone, or polyethylene, medicines, foods, dyes, or preservatives -pregnant or trying to get pregnant -breast-feeding How should I use this medicine? This device is placed inside the uterus by a health care professional. Talk to your pediatrician regarding the use of this medicine in children. Special care may be needed. Overdosage: If you think you have taken too much of this medicine contact a poison control center or emergency room at once. NOTE: This medicine is only for you. Do not share this medicine with others. What if I miss a dose? This does not apply. Depending on the brand of device you have inserted, the device will need to be replaced every 3 to 5 years if you wish to continue using this type of birth control. What may interact with this medicine? Do not take this medicine with any of the following medications: -amprenavir -bosentan -fosamprenavir This medicine may also interact with the following medications: -aprepitant -armodafinil -barbiturate medicines for inducing sleep or treating seizures -bexarotene -boceprevir -griseofulvin -medicines to treat seizures like carbamazepine, ethotoin,  felbamate, oxcarbazepine, phenytoin, topiramate -modafinil -pioglitazone -rifabutin -rifampin -rifapentine -some medicines to treat HIV infection like atazanavir, efavirenz, indinavir, lopinavir, nelfinavir, tipranavir, ritonavir -St. John's wort -warfarin This list may not  describe all possible interactions. Give your health care provider a list of all the medicines, herbs, non-prescription drugs, or dietary supplements you use. Also tell them if you smoke, drink alcohol, or use illegal drugs. Some items may interact with your medicine. What should I watch for while using this medicine? Visit your doctor or health care professional for regular check ups. See your doctor if you or your partner has sexual contact with others, becomes HIV positive, or gets a sexual transmitted disease. This product does not protect you against HIV infection (AIDS) or other sexually transmitted diseases. You can check the placement of the IUD yourself by reaching up to the top of your vagina with clean fingers to feel the threads. Do not pull on the threads. It is a good habit to check placement after each menstrual period. Call your doctor right away if you feel more of the IUD than just the threads or if you cannot feel the threads at all. The IUD may come out by itself. You may become pregnant if the device comes out. If you notice that the IUD has come out use a backup birth control method like condoms and call your health care provider. Using tampons will not change the position of the IUD and are okay to use during your period. This IUD can be safely scanned with magnetic resonance imaging (MRI) only under specific conditions. Before you have an MRI, tell your healthcare provider that you have an IUD in place, and which type of IUD you have in place. What side effects may I notice from receiving this medicine? Side effects that you should report to your doctor or health care professional as soon as possible: -allergic reactions like skin rash, itching or hives, swelling of the face, lips, or tongue -fever, flu-like symptoms -genital sores -high blood pressure -no menstrual period for 6 weeks during use -pain, swelling, warmth in the leg -pelvic pain or tenderness -severe or sudden  headache -signs of pregnancy -stomach cramping -sudden shortness of breath -trouble with balance, talking, or walking -unusual vaginal bleeding, discharge -yellowing of the eyes or skin Side effects that usually do not require medical attention (report to your doctor or health care professional if they continue or are bothersome): -acne -breast pain -change in sex drive or performance -changes in weight -cramping, dizziness, or faintness while the device is being inserted -headache -irregular menstrual bleeding within first 3 to 6 months of use -nausea This list may not describe all possible side effects. Call your doctor for medical advice about side effects. You may report side effects to FDA at 1-800-FDA-1088. Where should I keep my medicine? This does not apply. NOTE: This sheet is a summary. It may not cover all possible information. If you have questions about this medicine, talk to your doctor, pharmacist, or health care provider.  2019 Elsevier/Gold Standard (2016-05-30 14:14:56) Preventive Care 40-64 Years, Female Preventive care refers to lifestyle choices and visits with your health care provider that can promote health and wellness. What does preventive care include?   A yearly physical exam. This is also called an annual well check.  Dental exams once or twice a year.  Routine eye exams. Ask your health care provider how often you should have your eyes checked.  Personal lifestyle choices, including: ? Daily care  of your teeth and gums. ? Regular physical activity. ? Eating a healthy diet. ? Avoiding tobacco and drug use. ? Limiting alcohol use. ? Practicing safe sex. ? Taking low-dose aspirin daily starting at age 50. ? Taking vitamin and mineral supplements as recommended by your health care provider. What happens during an annual well check? The services and screenings done by your health care provider during your annual well check will depend on your age,  overall health, lifestyle risk factors, and family history of disease. Counseling Your health care provider may ask you questions about your:  Alcohol use.  Tobacco use.  Drug use.  Emotional well-being.  Home and relationship well-being.  Sexual activity.  Eating habits.  Work and work Statistician.  Method of birth control.  Menstrual cycle.  Pregnancy history. Screening You may have the following tests or measurements:  Height, weight, and BMI.  Blood pressure.  Lipid and cholesterol levels. These may be checked every 5 years, or more frequently if you are over 10 years old.  Skin check.  Lung cancer screening. You may have this screening every year starting at age 37 if you have a 30-pack-year history of smoking and currently smoke or have quit within the past 15 years.  Colorectal cancer screening. All adults should have this screening starting at age 23 and continuing until age 54. Your health care provider may recommend screening at age 64. You will have tests every 1-10 years, depending on your results and the type of screening test. People at increased risk should start screening at an earlier age. Screening tests may include: ? Guaiac-based fecal occult blood testing. ? Fecal immunochemical test (FIT). ? Stool DNA test. ? Virtual colonoscopy. ? Sigmoidoscopy. During this test, a flexible tube with a tiny camera (sigmoidoscope) is used to examine your rectum and lower colon. The sigmoidoscope is inserted through your anus into your rectum and lower colon. ? Colonoscopy. During this test, a long, thin, flexible tube with a tiny camera (colonoscope) is used to examine your entire colon and rectum.  Hepatitis C blood test.  Hepatitis B blood test.  Sexually transmitted disease (STD) testing.  Diabetes screening. This is done by checking your blood sugar (glucose) after you have not eaten for a while (fasting). You may have this done every 1-3  years.  Mammogram. This may be done every 1-2 years. Talk to your health care provider about when you should start having regular mammograms. This may depend on whether you have a family history of breast cancer.  BRCA-related cancer screening. This may be done if you have a family history of breast, ovarian, tubal, or peritoneal cancers.  Pelvic exam and Pap test. This may be done every 3 years starting at age 60. Starting at age 11, this may be done every 5 years if you have a Pap test in combination with an HPV test.  Bone density scan. This is done to screen for osteoporosis. You may have this scan if you are at high risk for osteoporosis. Discuss your test results, treatment options, and if necessary, the need for more tests with your health care provider. Vaccines Your health care provider may recommend certain vaccines, such as:  Influenza vaccine. This is recommended every year.  Tetanus, diphtheria, and acellular pertussis (Tdap, Td) vaccine. You may need a Td booster every 10 years.  Varicella vaccine. You may need this if you have not been vaccinated.  Zoster vaccine. You may need this after age 77.  Measles,  mumps, and rubella (MMR) vaccine. You may need at least one dose of MMR if you were born in 1957 or later. You may also need a second dose.  Pneumococcal 13-valent conjugate (PCV13) vaccine. You may need this if you have certain conditions and were not previously vaccinated.  Pneumococcal polysaccharide (PPSV23) vaccine. You may need one or two doses if you smoke cigarettes or if you have certain conditions.  Meningococcal vaccine. You may need this if you have certain conditions.  Hepatitis A vaccine. You may need this if you have certain conditions or if you travel or work in places where you may be exposed to hepatitis A.  Hepatitis B vaccine. You may need this if you have certain conditions or if you travel or work in places where you may be exposed to hepatitis  B.  Haemophilus influenzae type b (Hib) vaccine. You may need this if you have certain conditions. Talk to your health care provider about which screenings and vaccines you need and how often you need them. This information is not intended to replace advice given to you by your health care provider. Make sure you discuss any questions you have with your health care provider. Document Released: 09/14/2015 Document Revised: 10/08/2017 Document Reviewed: 06/19/2015 Elsevier Interactive Patient Education  2019 [employer].

## 2018-09-14 ENCOUNTER — Encounter: Payer: Self-pay | Admitting: Certified Nurse Midwife

## 2018-09-14 ENCOUNTER — Ambulatory Visit (INDEPENDENT_AMBULATORY_CARE_PROVIDER_SITE_OTHER): Payer: BC Managed Care – PPO | Admitting: Certified Nurse Midwife

## 2018-09-14 ENCOUNTER — Ambulatory Visit (INDEPENDENT_AMBULATORY_CARE_PROVIDER_SITE_OTHER): Payer: BC Managed Care – PPO

## 2018-09-14 VITALS — BP 142/90 | HR 83 | Ht 64.0 in | Wt 168.2 lb

## 2018-09-14 DIAGNOSIS — Z30433 Encounter for removal and reinsertion of intrauterine contraceptive device: Secondary | ICD-10-CM | POA: Diagnosis not present

## 2018-09-14 DIAGNOSIS — Z30431 Encounter for routine checking of intrauterine contraceptive device: Secondary | ICD-10-CM

## 2018-09-14 NOTE — Progress Notes (Signed)
Carrie Cross is a 43 y.o. year old G6P0101 [place] female who presents for removal and replacement of a Mirena IUD. She was given informed consent for removal and reinsertion of her Mirena. Her Mirena was placed 2015 by Lorelle Gibbs, CNM.   The risks and benefits of the method and placement have been thouroughly reviewed with the patient and all questions were answered.  Specifically the patient is aware of failure rate of 09/998, expulsion of the IUD and of possible perforation.  The patient is aware of irregular bleeding due to the method and understands the incidence of irregular bleeding diminishes with time.  Signed copy of informed consent in chart.   BP (!) 142/90   Pulse 83   Ht 5\' 4"  (1.626 m)   Wt 168 lb 3.2 oz (76.3 kg)   BMI 28.87 kg/m    Appropriate time out taken. A medium plastic speculum was placed in the vagina.  The cervix was visualized, prepped using Betadine. The strings were NOT visible, so IUD placement was verified by ultrasound. IUD hook was used to visual stings then they were grasped and the Mirena was easily removed. The cervix was then grasped with a single-tooth tenaculum. The uterus was  sounded to 7 cm.  Mirena IUD placed per manufacturer's recommendations without complications. The strings were trimmed to 3 cm.  The patient tolerated the procedure well.   IUD placement was verified by ultrasound, see below.   The patient was given post procedure instructions, including signs and symptoms of infection and to check for the strings after each menses or each month, and refraining from intercourse or anything in the vagina for 3 days.  She was given a Mirena care card with date Mirena placed, and date Mirena to be removed.  Reviewed red flag symptoms and when to call.   RTC x 4-6 weeks for IUD string check or sooner if needed.    Diona Fanti, CNM [facility], [facility] 09/14/18 1:39 PM    NDC: 67341-937-90 Lot:  [id number] Exp: 10/2020    ULTRASOUND REPORT  Location: [facility]  Date of Service: 09/14/2018   Indications:IUD checking Findings:  The uterus is anteverted and measures 7.8x3.7x5.2cm. Echo texture is homogenous without evidence of focal masses. The Endometrium measures 4.3 mm. Anterior lip of the cervix has a cyst 2x2cm that has a hypoechoic material inside ( Nabothian cyst that has blood clot vs others) Right Ovary measures 17.8 cm3. It is normal in appearance. Left Ovary measures 10.3 cm3. It is normal in appearance. Survey of the adnexa demonstrates no adnexal masses. There is no free fluid in the cul de sac.  Impression: 1. Anterior lip of the cervix has a cyst 2x2cm that has a hypoechoic material inside ( Nabothian cyst that has blood clot vs others) 2. IUD insitu  Recommendations: 1.Clinical correlation with the patient's History and Physical Exam.

## 2018-09-14 NOTE — Patient Instructions (Signed)
IUD PLACEMENT POST-PROCEDURE INSTRUCTIONS  1. You may take Ibuprofen, Aleve or Tylenol for pain if needed.  Cramping should resolve within in 24 hours.  2. You may have a small amount of spotting.  You should wear a mini pad for the next few days.  3. You may have intercourse after 72 hours.  If you using this for birth control, it is effective immediately.  4. You need to call if you have any pelvic pain, fever, heavy bleeding or foul smelling vaginal discharge.  Irregular bleeding is common the first several months after having an IUD placed. You do not need to call for this reason unless you are concerned.  5. Shower or bathe as normal  6. You should have a follow-up appointment in 4-8 weeks for a re-check to make sure you are not having any problems.  Levonorgestrel intrauterine device (IUD) What is this medicine? LEVONORGESTREL IUD (LEE voe nor jes trel) is a contraceptive (birth control) device. The device is placed inside the uterus by a healthcare professional. It is used to prevent pregnancy. This device can also be used to treat heavy bleeding that occurs during your period. This medicine may be used for other purposes; ask your health care provider or pharmacist if you have questions. COMMON BRAND NAME(S): Minette Headland What should I tell my health care provider before I take this medicine? They need to know if you have any of these conditions: -abnormal Pap smear -cancer of the breast, uterus, or cervix -diabetes -endometritis -genital or pelvic infection now or in the past -have more than one sexual partner or your partner has more than one partner -heart disease -history of an ectopic or tubal pregnancy -immune system problems -IUD in place -liver disease or tumor -problems with blood clots or take blood-thinners -seizures -use intravenous drugs -uterus of unusual shape -vaginal bleeding that has not been explained -an unusual or allergic reaction  to levonorgestrel, other hormones, silicone, or polyethylene, medicines, foods, dyes, or preservatives -pregnant or trying to get pregnant -breast-feeding How should I use this medicine? This device is placed inside the uterus by a health care professional. Talk to your pediatrician regarding the use of this medicine in children. Special care may be needed. Overdosage: If you think you have taken too much of this medicine contact a poison control center or emergency room at once. NOTE: This medicine is only for you. Do not share this medicine with others. What if I miss a dose? This does not apply. Depending on the brand of device you have inserted, the device will need to be replaced every 3 to 5 years if you wish to continue using this type of birth control. What may interact with this medicine? Do not take this medicine with any of the following medications: -amprenavir -bosentan -fosamprenavir This medicine may also interact with the following medications: -aprepitant -armodafinil -barbiturate medicines for inducing sleep or treating seizures -bexarotene -boceprevir -griseofulvin -medicines to treat seizures like carbamazepine, ethotoin, felbamate, oxcarbazepine, phenytoin, topiramate -modafinil -pioglitazone -rifabutin -rifampin -rifapentine -some medicines to treat HIV infection like atazanavir, efavirenz, indinavir, lopinavir, nelfinavir, tipranavir, ritonavir -St. John's wort -warfarin This list may not describe all possible interactions. Give your health care provider a list of all the medicines, herbs, non-prescription drugs, or dietary supplements you use. Also tell them if you smoke, drink alcohol, or use illegal drugs. Some items may interact with your medicine. What should I watch for while using this medicine? Visit your doctor or health care  professional for regular check ups. See your doctor if you or your partner has sexual contact with others, becomes HIV positive,  or gets a sexual transmitted disease. This product does not protect you against HIV infection (AIDS) or other sexually transmitted diseases. You can check the placement of the IUD yourself by reaching up to the top of your vagina with clean fingers to feel the threads. Do not pull on the threads. It is a good habit to check placement after each menstrual period. Call your doctor right away if you feel more of the IUD than just the threads or if you cannot feel the threads at all. The IUD may come out by itself. You may become pregnant if the device comes out. If you notice that the IUD has come out use a backup birth control method like condoms and call your health care provider. Using tampons will not change the position of the IUD and are okay to use during your period. This IUD can be safely scanned with magnetic resonance imaging (MRI) only under specific conditions. Before you have an MRI, tell your healthcare provider that you have an IUD in place, and which type of IUD you have in place. What side effects may I notice from receiving this medicine? Side effects that you should report to your doctor or health care professional as soon as possible: -allergic reactions like skin rash, itching or hives, swelling of the face, lips, or tongue -fever, flu-like symptoms -genital sores -high blood pressure -no menstrual period for 6 weeks during use -pain, swelling, warmth in the leg -pelvic pain or tenderness -severe or sudden headache -signs of pregnancy -stomach cramping -sudden shortness of breath -trouble with balance, talking, or walking -unusual vaginal bleeding, discharge -yellowing of the eyes or skin Side effects that usually do not require medical attention (report to your doctor or health care professional if they continue or are bothersome): -acne -breast pain -change in sex drive or performance -changes in weight -cramping, dizziness, or faintness while the device is being  inserted -headache -irregular menstrual bleeding within first 3 to 6 months of use -nausea This list may not describe all possible side effects. Call your doctor for medical advice about side effects. You may report side effects to FDA at 1-800-FDA-1088. Where should I keep my medicine? This does not apply. NOTE: This sheet is a summary. It may not cover all possible information. If you have questions about this medicine, talk to your doctor, pharmacist, or health care provider.  2019 Elsevier/Gold Standard (2016-05-30 14:14:56)

## 2018-09-14 NOTE — Progress Notes (Signed)
Patient here for Mirena IUD removal and insertion.

## 2018-09-26 ENCOUNTER — Encounter: Payer: Self-pay | Admitting: Family Medicine

## 2018-09-27 MED ORDER — OMEPRAZOLE 20 MG PO CPDR: DELAYED_RELEASE_CAPSULE | ORAL | 0 refills | Status: DC

## 2018-10-26 ENCOUNTER — Encounter: Payer: Self-pay | Admitting: Certified Nurse Midwife

## 2018-10-26 ENCOUNTER — Ambulatory Visit: Payer: BC Managed Care – PPO | Admitting: Certified Nurse Midwife

## 2018-10-26 VITALS — BP 138/84 | HR 84 | Ht 64.0 in | Wt 169.4 lb

## 2018-10-26 DIAGNOSIS — Z30431 Encounter for routine checking of intrauterine contraceptive device: Secondary | ICD-10-CM | POA: Diagnosis not present

## 2018-10-26 MED ORDER — BUTALBITAL-ACETAMINOPHEN 50-300 MG PO TABS: ORAL_TABLET | ORAL | 1 refills | Status: DC

## 2018-10-26 NOTE — Progress Notes (Signed)
    GYNECOLOGY OFFICE ENCOUNTER NOTE  History:  43 y.o. G1P0101 here today for today for IUD string check; Mirena  IUD was placed 09/14/2018. No complaints about the IUD, no concerning side effects.  Denies difficulty breathing or respiratory distress, chest pain, abdominal pain, vaginal bleeding, dysuria, and leg pain or swelling.   The following portions of the patient's history were reviewed and updated as appropriate: allergies, current medications, past family history, past medical history, past social history, past surgical history and problem list. Last pap smear on 09/10/2017 was normal, negative HRHPV.  Review of Systems:   Pertinent items are noted in HPI.  Objective:   BP 138/84   Pulse 84   Ht 5\' 4"  (1.626 m)   Wt 169 lb 6.4 oz (76.8 kg)   BMI 29.08 kg/m   Physical Exam  CONSTITUTIONAL: Well-developed, well-nourished female in no acute distress.   ABDOMEN: Soft, no distention noted.    PELVIC: Normal appearing external genitalia; normal appearing vaginal mucosa and cervix.  IUD strings visualized, about 3 cm in length outside cervix.   Assessment & Plan:   Patient to keep IUD in place for up to seven years; can come in for removal if she desires pregnancy earlier or for or concerning side effects.  Reviewed red flag symptoms and when to call.   RTC x 11 months for ANNUAL EXAM or sooner if needed.    Diona Fanti, CNM [facility], [facility] 10/26/18 4:19 PM

## 2018-10-26 NOTE — Patient Instructions (Signed)

## 2018-11-02 ENCOUNTER — Telehealth: Payer: BC Managed Care – PPO | Admitting: Physician Assistant

## 2018-11-02 DIAGNOSIS — R0602 Shortness of breath: Secondary | ICD-10-CM

## 2018-11-02 DIAGNOSIS — J029 Acute pharyngitis, unspecified: Secondary | ICD-10-CM

## 2018-11-02 DIAGNOSIS — H9202 Otalgia, left ear: Secondary | ICD-10-CM

## 2018-11-02 NOTE — Progress Notes (Signed)
Good morning Ms. Carrie Cross. Based on what you shared with me, I feel your condition warrants further evaluation and I recommend that you be seen for a face to face office visit. My biggest concern is your report of shortness of breath.  This needs a face to face eval to ensure that your remain healthy.    NOTE: If you entered your credit card information for this eVisit, you will not be charged. You may see a "hold" on your card for the $30 but that hold will drop off and you will not have a charge processed.  If you are having a true medical emergency please call 911.  If you need an urgent face to face visit, [facility] has four urgent care centers for your convenience.  If you need care fast and have a high deductible or no insurance consider: ?  [email or web address] to reserve your spot online an avoid wait times  [facility] [address] [place], [place] [address] 8 am to 8 pm Monday-Friday 10 am to 4 pm Saturday-Sunday *Across the street from [employer]  [place], [address] 8 am to 5 pm Monday-Friday * In the [facility] on the [facility]   The following sites will take your insurance:  [facility] a Provider at this Location  [place], [place] [address] 10 am to 8 pm Monday-Friday 12 pm to 8 pm [facility] a Provider at this Location  [place], [place], [place] [address] 8 am to 8 pm Monday-Friday 9 am to 6 pm Saturday 11 am to 6 pm Sunday   [facility]  [phone] Get Driving Directions  [address] [place], [place] [address] 8 am to 8 pm Monday-Friday 8 am to 4 pm Saturday-Sunday   Your e-visit answers were reviewed by a board certified  advanced clinical practitioner to complete your personal care plan.  Thank you for using e-Visits.  ===View-only below this line===   ----- Message -----    From: [name]    Sent: 11/02/2018  8:58 AM EST      To: E-Visit Mailing List Subject: E-Visit Submission: Flu Like Symptoms  E-Visit Submission: Flu Like Symptoms --------------------------------  Question: How long have you had flu like symptoms? Answer:   over two days  Question: Are you having any body aches or pain such as Answer:   None  Question: Do you have a cough or sore throat? Answer:   I have both a cough and a sore throat  Question: Are you in close contact with anyone who has similar symptoms ? Answer:   Yes  Question: Do you have a fever? Answer:   No, I do not have a fever  Question: Describe your sore throat: Answer:   Scratchy, hurts to swallow  Question: How long have you had a sore throat? Answer:   3 days  Question: Do you have any tenderness or swelling in your neck? Answer:   No  Question: Are you short of breath? Answer:   Yes  Question: Do you have constant pain in your chest or abdomen? Answer:   No  Question: Are you able to keep liquids down? Answer:   Yes  Question: Have you experienced a seizure or loss  of consciousness? Answer:   No  Question: Do you have a headache? Answer:   Yes  Question: Is there a rash? Answer:   No  Question: Are you over the age of 65? Answer:   No  Question: Are you treated for any of the following conditions: Asthma, COPD, diabetes, renal failure on dialysis, AIDS, any neuromuscular disease that effects the clearing of secretions heart failure or heart disease? Answer:   No  Question: Have you received recent chemotherapy or are you taking a drug that may reduce your ability to fight infections for psoriasis, arthritis, or any other autoimmune condition? Answer:   No  Question: Do you have close contact with anyone who has been  diagnosed with any of the conditions listed above? Answer:   No  Question: Are you pregnant? Answer:   I am confident that I am not pregnant  Question: Are you breastfeeding? Answer:   No  Question: Are your symptoms severe enough that you think or someone has told you that you need to see a physician urgently? Answer:   No  Question: Are you allergic to Zanamivir or Oseltamivir (Tamiflu)? Answer:   No  Question: Are there children in your family or household that are under the age of 56? Answer:   No  Question: Please list your medication allergies that you may have ? (If 'none' , please list as 'none') Answer:   None  Question: Please list any additional comments  Answer:   My left ear feels extremely stopped up and uncomfortable. Saturday night I had a low grade fever of 100.9.  A total of 5-10 minutes was spent evaluating this patients questionnaire and formulating a plan of care.

## 2018-11-13 ENCOUNTER — Other Ambulatory Visit: Payer: Self-pay | Admitting: Family Medicine

## 2018-11-17 ENCOUNTER — Other Ambulatory Visit: Payer: Self-pay | Admitting: Family Medicine

## 2018-12-24 ENCOUNTER — Other Ambulatory Visit: Payer: Self-pay | Admitting: Family Medicine

## 2019-01-17 IMAGING — MG MM DIGITAL DIAGNOSTIC BILAT W/ TOMO W/ CAD
8 series · 8 of 24 positions shown · non-contrast
Comparison: 01/20/2018 and earlier priors

CLINICAL DATA: Possible masses were identified in the bilateral
breasts in the CC views only.

EXAM:
DIGITAL DIAGNOSTIC BILATERAL MAMMOGRAM WITH CAD AND TOMO

[R ML synth-2D]
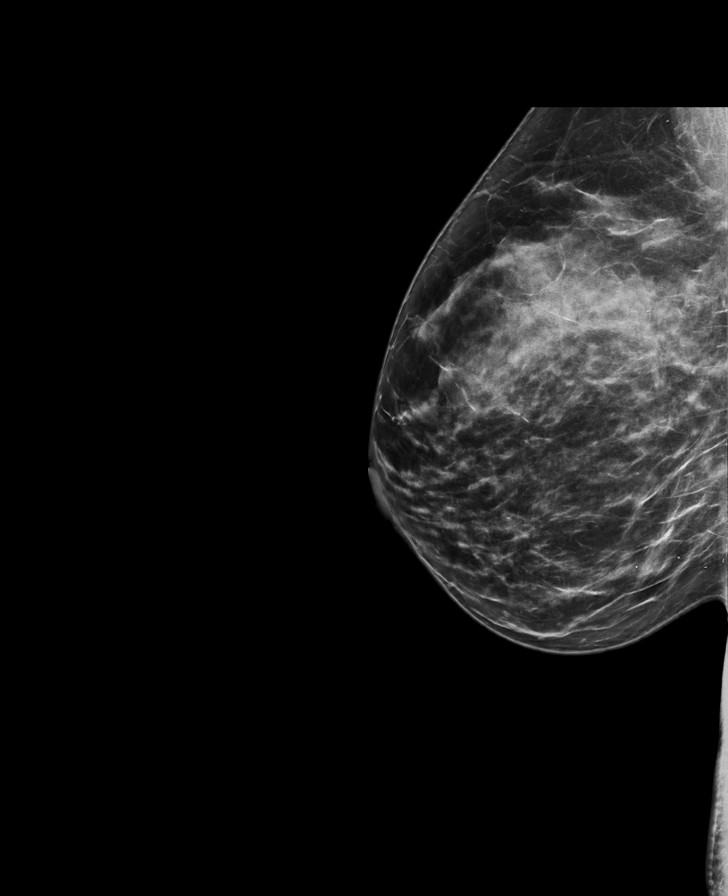

[R CC synth-2D]
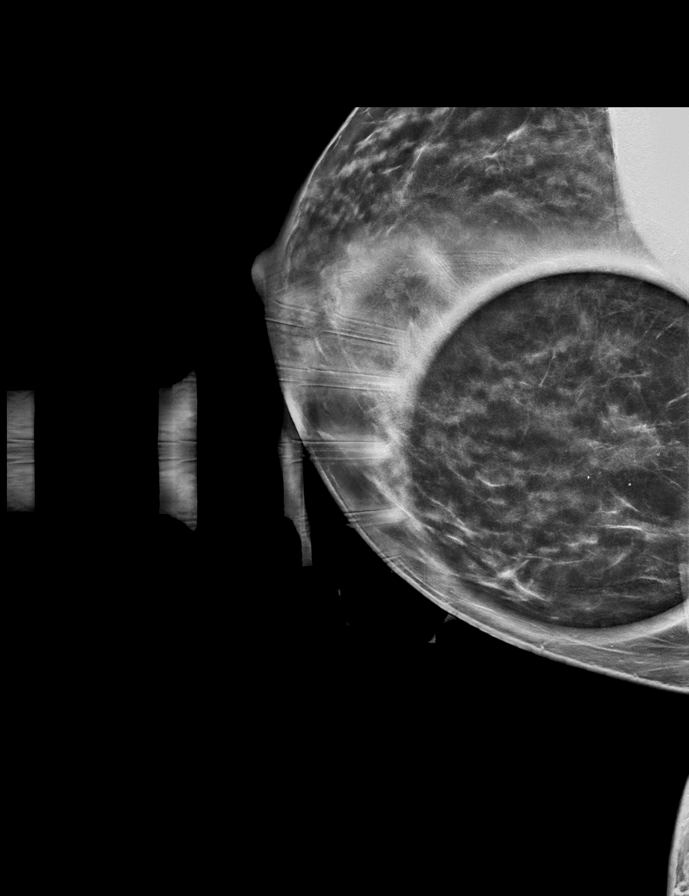

[L ML synth-2D]
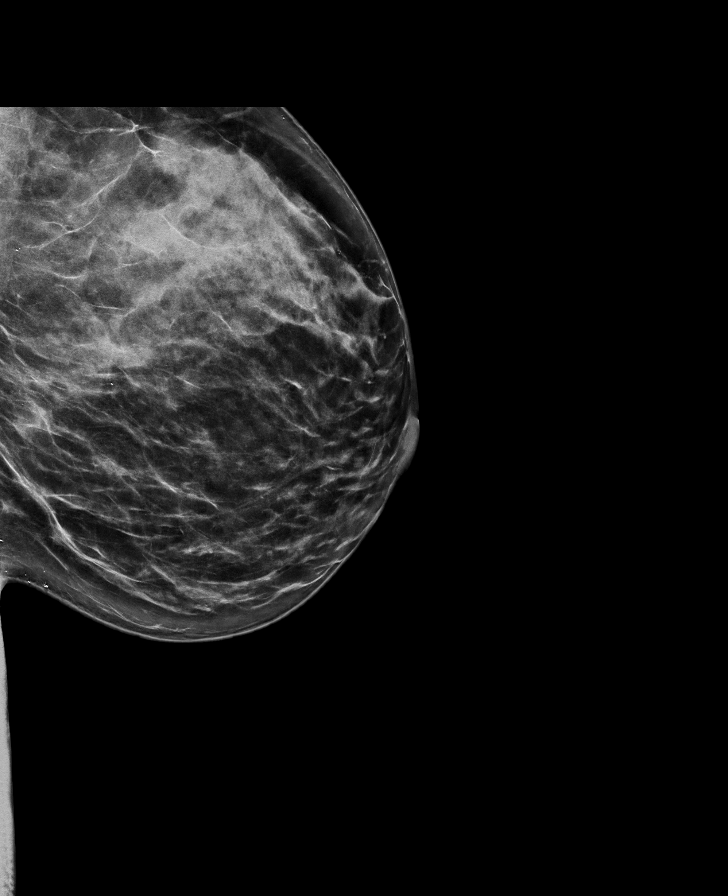

[L CC synth-2D]
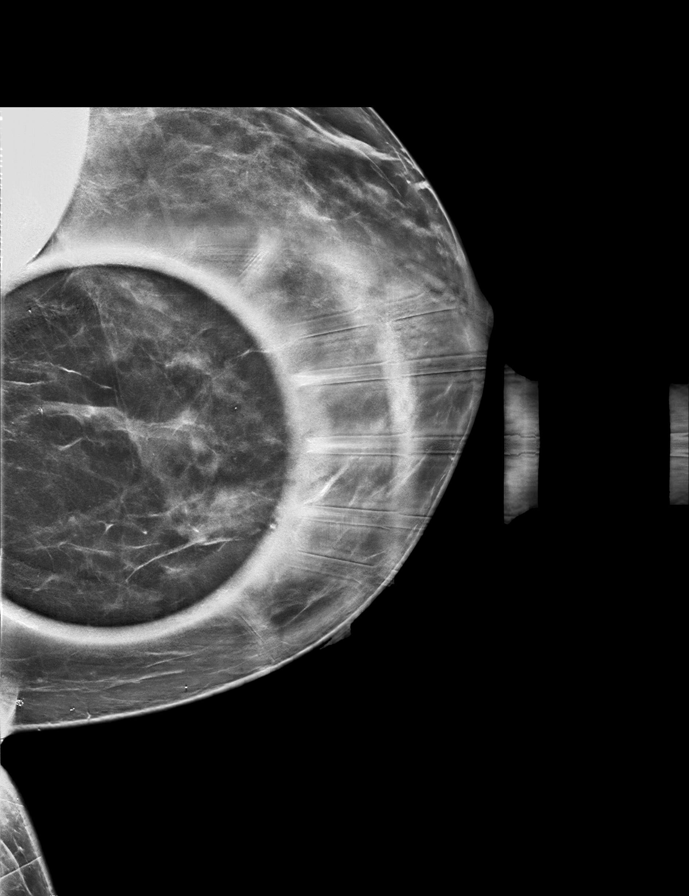

[R ML tomo · tomo slice 43/84.0]
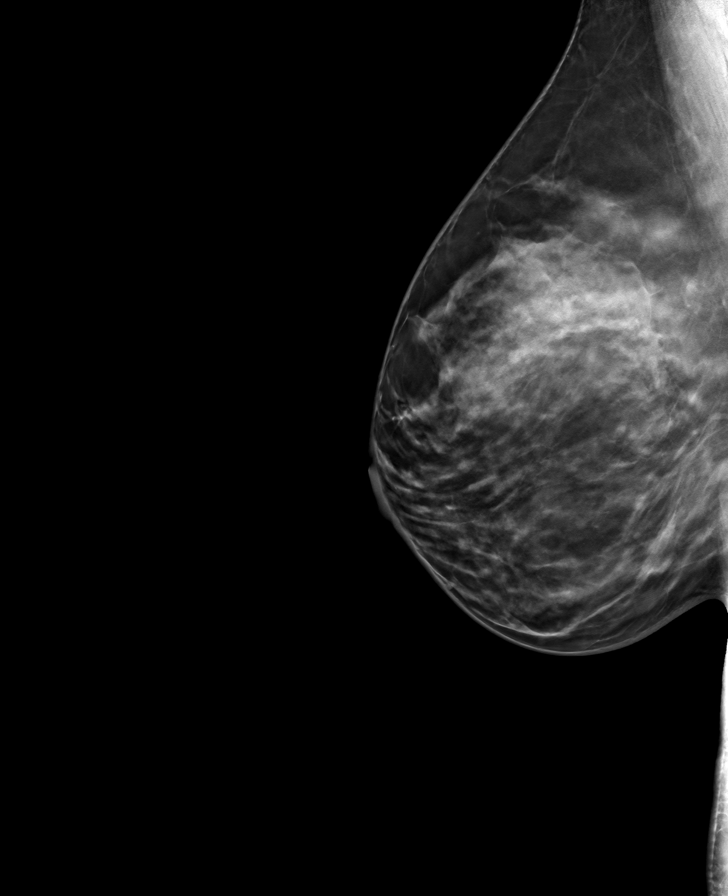

[L CC tomo · tomo slice 39/77.0]
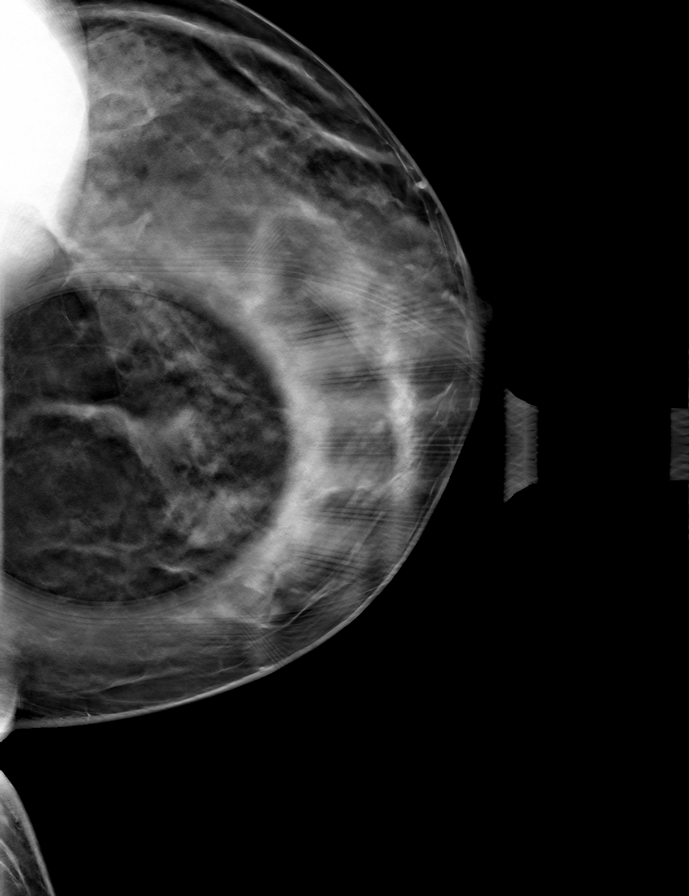

[R CC tomo · tomo slice 35/68.0]
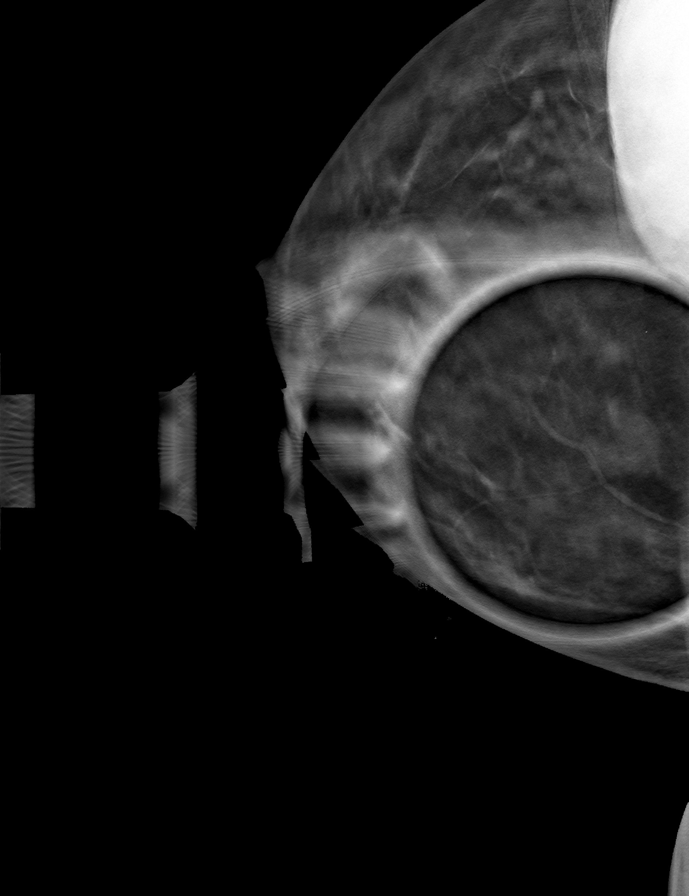

[L ML tomo · tomo slice 39/78.0]
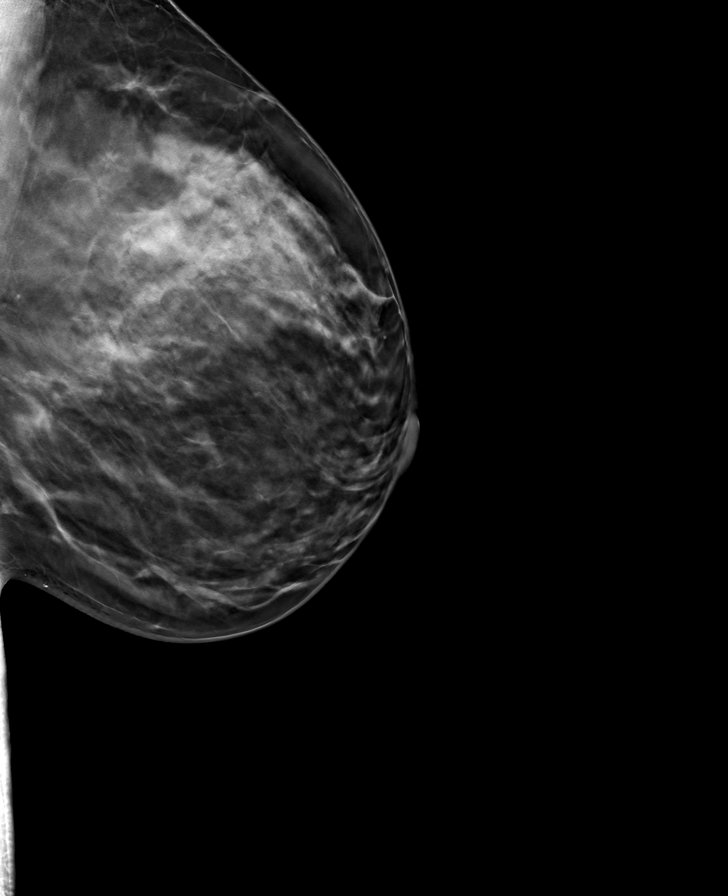

[8 of 24 positions shown; findings below may reference images not displayed]

ACR Breast Density Category c: The breast tissue is heterogeneously
dense, which may obscure small masses.
FINDINGS: Focal spot compression views of the medial breasts bilaterally,
including tomography, are negative for mass or distortion. There is
a stable and normal appearing fibroglandular pattern in these
regions. 90 degree lateral views of both breasts with tomography are
negative for mass or distortion.

Mammographic images were processed with CAD.
IMPRESSION: No evidence of malignancy in either breast.

RECOMMENDATION:
Screening mammogram in one year.(Code:[id number])

I have discussed the findings and recommendations with the patient.
Results were also provided in writing at the conclusion of the
visit. If applicable, a reminder letter will be sent to the patient
regarding the next appointment.

BI-RADS CATEGORY  1: Negative.

## 2019-01-30 ENCOUNTER — Other Ambulatory Visit: Payer: Self-pay | Admitting: Family Medicine

## 2019-01-31 NOTE — Telephone Encounter (Signed)
Please call to schedule appointment within next month

## 2019-02-18 ENCOUNTER — Telehealth: Payer: Self-pay | Admitting: Family Medicine

## 2019-02-18 MED ORDER — FAMOTIDINE 40 MG PO TABS: 40.0000 mg | ORAL_TABLET | Freq: Every day | ORAL | 3 refills | Status: DC

## 2019-02-18 NOTE — Telephone Encounter (Signed)
Carrie Cross with [facility] calling and states that the patient is trying to get refills on ranitidine (ZANTAC) 300 MG tablet  States that this medication, in all strengths, has been on backorder. Would like ot know if there is an alternate medication that would like to be sent in? States that they will leave this ticket open until end of business on Tuesday. Please advise.  CB#: [phone] Ref#: [id number]

## 2019-02-18 NOTE — Telephone Encounter (Signed)
Famotidine 40mg  PO daily

## 2019-02-25 ENCOUNTER — Encounter: Payer: Self-pay | Admitting: Certified Nurse Midwife

## 2019-02-25 ENCOUNTER — Telehealth: Payer: Self-pay | Admitting: Family Medicine

## 2019-02-25 ENCOUNTER — Other Ambulatory Visit: Payer: Self-pay

## 2019-02-25 ENCOUNTER — Other Ambulatory Visit: Payer: Self-pay | Admitting: Family Medicine

## 2019-02-25 ENCOUNTER — Other Ambulatory Visit: Payer: Self-pay | Admitting: Certified Nurse Midwife

## 2019-02-25 DIAGNOSIS — Z1231 Encounter for screening mammogram for malignant neoplasm of breast: Secondary | ICD-10-CM

## 2019-02-25 MED ORDER — METOPROLOL SUCCINATE ER 100 MG PO TB24: 100.0000 mg | ORAL_TABLET | Freq: Every day | ORAL | 1 refills | Status: DC

## 2019-02-25 NOTE — Telephone Encounter (Signed)
Yes, we can refill already established medications until new PCP. JML

## 2019-02-25 NOTE — Telephone Encounter (Signed)
Received request for alternative to Zantac. Patient needs an appointment - has not been seen in a year.  Please schedule

## 2019-02-25 NOTE — Telephone Encounter (Signed)
Pt stated that a alternative had already been sent to the pharmacy. So she scheduled an appt for late august

## 2019-03-10 ENCOUNTER — Other Ambulatory Visit: Payer: Self-pay

## 2019-03-10 ENCOUNTER — Ambulatory Visit
Admission: RE | Admit: 2019-03-10 | Discharge: 2019-03-10 | Disposition: A | Discharge status: Home / Self Care | Payer: BC Managed Care – PPO | Source: Ambulatory Visit | Attending: Certified Nurse Midwife | Admitting: Certified Nurse Midwife

## 2019-03-10 DIAGNOSIS — Z1231 Encounter for screening mammogram for malignant neoplasm of breast: Secondary | ICD-10-CM | POA: Diagnosis not present

## 2019-03-15 ENCOUNTER — Telehealth: Payer: Self-pay | Admitting: Family Medicine

## 2019-03-15 NOTE — Telephone Encounter (Signed)
Famotidine was already sent in as an alternative on 02/18/2019.  Please call pharmacy to clarify.

## 2019-03-15 NOTE — Telephone Encounter (Signed)
Carrie Cross tech  [facility] is calling zantac 300 mg has been recalled. Pt will need another medication. The reference number [id number]

## 2019-03-15 NOTE — Telephone Encounter (Signed)
lft voice mail

## 2019-03-21 ENCOUNTER — Other Ambulatory Visit: Payer: Self-pay | Admitting: Family Medicine

## 2019-04-25 ENCOUNTER — Other Ambulatory Visit: Payer: Self-pay

## 2019-04-25 ENCOUNTER — Ambulatory Visit: Payer: BC Managed Care – PPO | Admitting: Nurse Practitioner

## 2019-04-25 ENCOUNTER — Encounter: Payer: Self-pay | Admitting: Nurse Practitioner

## 2019-04-25 VITALS — BP 150/92 | HR 89 | Temp 96.6°F | Resp 14 | Ht 62.0 in | Wt 169.3 lb

## 2019-04-25 DIAGNOSIS — I10 Essential (primary) hypertension: Secondary | ICD-10-CM

## 2019-04-25 DIAGNOSIS — K219 Gastro-esophageal reflux disease without esophagitis: Secondary | ICD-10-CM | POA: Diagnosis not present

## 2019-04-25 DIAGNOSIS — E782 Mixed hyperlipidemia: Secondary | ICD-10-CM | POA: Diagnosis not present

## 2019-04-25 DIAGNOSIS — R7303 Prediabetes: Secondary | ICD-10-CM | POA: Diagnosis not present

## 2019-04-25 DIAGNOSIS — E669 Obesity, unspecified: Secondary | ICD-10-CM | POA: Insufficient documentation

## 2019-04-25 DIAGNOSIS — IMO0002 Reserved for concepts with insufficient information to code with codable children: Secondary | ICD-10-CM

## 2019-04-25 DIAGNOSIS — Z6830 Body mass index (BMI) 30.0-30.9, adult: Secondary | ICD-10-CM | POA: Insufficient documentation

## 2019-04-25 DIAGNOSIS — J309 Allergic rhinitis, unspecified: Secondary | ICD-10-CM

## 2019-04-25 DIAGNOSIS — G43709 Chronic migraine without aura, not intractable, without status migrainosus: Secondary | ICD-10-CM

## 2019-04-25 MED ORDER — PANTOPRAZOLE SODIUM 20 MG PO TBEC: 20.0000 mg | DELAYED_RELEASE_TABLET | Freq: Every day | ORAL | 1 refills | Status: DC

## 2019-04-25 MED ORDER — METOPROLOL SUCCINATE ER 100 MG PO TB24: 100.0000 mg | ORAL_TABLET | Freq: Every day | ORAL | 1 refills | Status: DC

## 2019-04-25 MED ORDER — ATORVASTATIN CALCIUM 20 MG PO TABS: 20.0000 mg | ORAL_TABLET | Freq: Every day | ORAL | 1 refills | Status: DC

## 2019-04-25 NOTE — Patient Instructions (Addendum)
Return in the next week for fasting labs  Monitor your Blood pressure and call me if in the next two weeks your blood pressure average is >140/90, then we will need to start you on an additional medicine.   Managing Your Hypertension Hypertension is commonly called high blood pressure. This is when the force of your blood pressing against the walls of your arteries is too strong. Arteries are blood vessels that carry blood from your heart throughout your body. Hypertension forces the heart to work harder to pump blood, and may cause the arteries to become narrow or stiff. Having untreated or uncontrolled hypertension can cause heart attack, stroke, kidney disease, and other problems. What are blood pressure readings? A blood pressure reading consists of a higher number over a lower number. Ideally, your blood pressure should be below 120/80. The first ("top") number is called the systolic pressure. It is a measure of the pressure in your arteries as your heart beats. The second ("bottom") number is called the diastolic pressure. It is a measure of the pressure in your arteries as the heart relaxes. What does my blood pressure reading mean? Blood pressure is classified into four stages. Based on your blood pressure reading, your health care provider may use the following stages to determine what type of treatment you need, if any. Systolic pressure and diastolic pressure are measured in a unit called mm Hg. Normal  Systolic pressure: below 123456.  Diastolic pressure: below 80. Elevated  Systolic pressure: [id number].  Diastolic pressure: below 80. Hypertension stage 1  Systolic pressure: 0000000.  Diastolic pressure: [id number]. Hypertension stage 2  Systolic pressure: [id number] or above.  Diastolic pressure: 90 or above. What health risks are associated with hypertension? Managing your hypertension is an important responsibility. Uncontrolled hypertension can lead to:  A heart attack.  A  stroke.  A weakened blood vessel (aneurysm).  Heart failure.  Kidney damage.  Eye damage.  Metabolic syndrome.  Memory and concentration problems. What changes can I make to manage my hypertension? Hypertension can be managed by making lifestyle changes and possibly by taking medicines. Your health care provider will help you make a plan to bring your blood pressure within a normal range. Eating and drinking   Eat a diet that is high in fiber and potassium, and low in salt (sodium), added sugar, and fat. An example eating plan is called the DASH (Dietary Approaches to Stop Hypertension) diet. To eat this way: ? Eat plenty of fresh fruits and vegetables. Try to fill half of your plate at each meal with fruits and vegetables. ? Eat whole grains, such as whole wheat pasta, brown rice, or whole grain bread. Fill about one quarter of your plate with whole grains. ? Eat low-fat diary products. ? Avoid fatty cuts of meat, processed or cured meats, and poultry with skin. Fill about one quarter of your plate with lean proteins such as fish, chicken without skin, beans, eggs, and tofu. ? Avoid premade and processed foods. These tend to be higher in sodium, added sugar, and fat.  Reduce your daily sodium intake. Most people with hypertension should eat less than 1,500 mg of sodium a day.  Limit alcohol intake to no more than 1 drink a day for nonpregnant women and 2 drinks a day for men. One drink equals 12 oz of beer, 5 oz of wine, or 1 oz of hard liquor. Lifestyle  Work with your health care provider to maintain a healthy body weight, or to lose  weight. Ask what an ideal weight is for you.  Get at least 30 minutes of exercise that causes your heart to beat faster (aerobic exercise) most days of the week. Activities may include walking, swimming, or biking.  Include exercise to strengthen your muscles (resistance exercise), such as weight lifting, as part of your weekly exercise routine. Try  to do these types of exercises for 30 minutes at least 3 days a week.  Do not use any products that contain nicotine or tobacco, such as cigarettes and e-cigarettes. If you need help quitting, ask your health care provider.  Control any long-term (chronic) conditions you have, such as high cholesterol or diabetes. Monitoring  Monitor your blood pressure at home as told by your health care provider. Your personal target blood pressure may vary depending on your medical conditions, your age, and other factors.  Have your blood pressure checked regularly, as often as told by your health care provider. Working with your health care provider  Review all the medicines you take with your health care provider because there may be side effects or interactions.  Talk with your health care provider about your diet, exercise habits, and other lifestyle factors that may be contributing to hypertension.  Visit your health care provider regularly. Your health care provider can help you create and adjust your plan for managing hypertension. Will I need medicine to control my blood pressure? Your health care provider may prescribe medicine if lifestyle changes are not enough to get your blood pressure under control, and if:  Your systolic blood pressure is 130 or higher.  Your diastolic blood pressure is 80 or higher. Take medicines only as told by your health care provider. Follow the directions carefully. Blood pressure medicines must be taken as prescribed. The medicine does not work as well when you skip doses. Skipping doses also puts you at risk for problems. Contact a health care provider if:  You think you are having a reaction to medicines you have taken.  You have repeated (recurrent) headaches.  You feel dizzy.  You have swelling in your ankles.  You have trouble with your vision. Get help right away if:  You develop a severe headache or confusion.  You have unusual weakness or  numbness, or you feel faint.  You have severe pain in your chest or abdomen.  You vomit repeatedly.  You have trouble breathing. Summary  Hypertension is when the force of blood pumping through your arteries is too strong. If this condition is not controlled, it may put you at risk for serious complications.  Your personal target blood pressure may vary depending on your medical conditions, your age, and other factors. For most people, a normal blood pressure is less than 120/80.  Hypertension is managed by lifestyle changes, medicines, or both. Lifestyle changes include weight loss, eating a healthy, low-sodium diet, exercising more, and limiting alcohol. This information is not intended to replace advice given to you by your health care provider. Make sure you discuss any questions you have with your health care provider. Document Released: 05/12/2012 Document Revised: 12/10/2018 Document Reviewed: 07/16/2016 Elsevier Patient Education  2020 [employer].

## 2019-04-25 NOTE — Assessment & Plan Note (Addendum)
On lipitor 20 mg, compliant with meds, no SE Tx by working on diet - avoid trans fat, saturated fats, eat lean meats, add vegetables and whole grains Return for FLP and CMP

## 2019-04-25 NOTE — Progress Notes (Signed)
Name: Carrie Cross   MRN: [id number]    DOB: 02/09/1976   Date:04/25/2019       Progress Note  Chief Complaint  Patient presents with  . Follow-up  . Hypertension  . Gastroesophageal Reflux  . Hyperlipidemia     Subjective:   Carrie Cross is a 43 y.o. female, presents to clinic for routine follow up on the conditions listed above.  HTN:  BP elevated today, denies CP, palpitations, LE edema, SOB, HA's.  BP was repeated in clinic and it remained 150-152/92.  She takes metoprolol 100 mg daily.  In the past was on HCTZ, which she did not like the SE of urinary frequency.  She denies any SE from metoprolol, no fatigue or near syncope.  She has a BP cuff at home but does not use it very much.    HLD:  She is compliant with lipitor 20 mg daily at bedtime.  Denies myalgias, jaundice, fatigue.  She is not working on diet to improve cholesterol.  GERD:  Pt states shes had acid reflux for at least 7-8 years and requires pepcid and prilosec.  She was told to space out her dosing on prilosec so she does not take it on Wed and Sun but she has severe sx those days and frequently other days triggered easily by foods such as bacon.  She has not seen GI before.  Currently denies abdominal pain.  She denies dysphagia, N/V, bloating, indigestion, change in stool, melena, hematochezia.  She occasionally uses NSAIDs, never daily.  Occasionally drinks ETOH.    Prediabetes:   A1C over the past two years has been 5.8%, prior to that in 2017 was 6.0%, and 2016 was 6.7%, currently diet controlled, no meds.  She is not following any dietary guidelines, but she tries to only drink soda occasionally.  Weight up a little from last visit.  Migraines:   Pt rarely gets headaches - uses OTC meds PRN, usually not more than 1-2 x a week, not requiring any Rx meds to manage   AR:  Still using flonase and allegra daily as prescribed and atrovent nose spray PRN, sx controlled  Mirena - per OB/GYN, last PAP  2019, Mammogram just completed last month - she is unsure of last well woman exam.    She will transfer care to me, since her PCP retired.    She is not fasting this afternoon- will come back fasting in the next week to get blood work done.  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      Office Visit from 04/25/2019 in [facility]  AUDIT-C Score  1       Patient Active Problem List   Diagnosis Date Noted  . Prediabetes 04/25/2019  . Herpes simplex 08/10/2015  . Eczema 05/11/2015  . Overweight (BMI 25.0-29.9) 05/11/2015  . Hypertension   . Hyperlipidemia   . GERD (gastroesophageal reflux disease)   . Allergic rhinitis   . Chronic migraine     Past Surgical History:  Procedure Laterality Date  . BACK SURGERY  [id number]   disc shaved down    Family History  Problem Relation Age of Onset  . Breast cancer Maternal Grandmother 80  . Heart disease Maternal Grandfather   . Stroke Neg Hx   . Diabetes Neg Hx   . Ovarian cancer Neg Hx   . Colon cancer Neg Hx  Social History   Socioeconomic History  . Marital status: Married    Spouse name: Not on file  . Number of children: Not on file  . Years of education: Not on file  . Highest education level: Not on file  Occupational History  . Not on file  Social Needs  . Financial resource strain: Somewhat hard  . Food insecurity    Worry: Never true    Inability: Never true  . Transportation needs    Medical: No    Non-medical: No  Tobacco Use  . Smoking status: Former Smoker    Packs/day: 0.50    Years: 7.00    Pack years: 3.50    Types: Cigarettes    Quit date: 06/02/2011    Years since quitting: 7.9  . Smokeless tobacco: Never Used  Substance and Sexual Activity  . Alcohol use: Yes    Alcohol/week: 3.0 standard drinks    Types: 3 Glasses of wine per week  . Drug use: No  . Sexual activity:  Yes    Birth control/protection: I.U.D.    Comment: mirena  Lifestyle  . Physical activity    Days per week: 3 days    Minutes per session: 90 min  . Stress: Only a little  Relationships  . Social Herbalist on phone: Not on file    Gets together: Not on file    Attends religious service: Not on file    Active member of club or organization: Not on file    Attends meetings of clubs or organizations: Not on file    Relationship status: Married  . Intimate partner violence    Fear of current or ex partner: No    Emotionally abused: No    Physically abused: No    Forced sexual activity: No  Other Topics Concern  . Not on file  Social History Narrative  . Not on file     Current Outpatient Medications:  .  famotidine (PEPCID) 40 MG tablet, Take 1 tablet (40 mg total) by mouth daily., Disp: 90 tablet, Rfl: 3 .  fexofenadine (ALLEGRA) 180 MG tablet, TAKE 1 TABLET BY MOUTH EVERY DAY AS NEEDED FOR ALLERGIES OR SINUS, Disp: 90 tablet, Rfl: 3 .  fluticasone (FLONASE) 50 MCG/ACT nasal spray, USE 2 SPRAYS IN EACH       NOSTRIL DAILY, Disp: 48 g, Rfl: 1 .  ipratropium (ATROVENT) 0.03 % nasal spray, Place 2 sprays into both nostrils 3 (three) times daily., Disp: 30 mL, Rfl: 12 .  levonorgestrel (MIRENA) 20 MCG/24HR IUD, by Intrauterine route. Reported on 12/13/2015, Disp: , Rfl:  .  atorvastatin (LIPITOR) 20 MG tablet, Take 1 tablet (20 mg total) by mouth at bedtime., Disp: 90 tablet, Rfl: 1 .  metoprolol succinate (TOPROL-XL) 100 MG 24 hr tablet, Take 1 tablet (100 mg total) by mouth daily. Take with or immediately following a meal., Disp: 90 tablet, Rfl: 1 .  pantoprazole (PROTONIX) 20 MG tablet, Take 1 tablet (20 mg total) by mouth daily., Disp: 90 tablet, Rfl: 1  No Known Allergies  I personally reviewed active problem list, medication list, allergies, social history, health maintenance, notes from last encounter, lab results, imaging with the patient/caregiver today.  Review  of Systems   Objective:    Vitals:   04/25/19 1538 04/25/19 1547  BP: (!) 152/92 (!) 150/92  Pulse: 89   Resp: 14   Temp: (!) 96.6 F (35.9 C)   SpO2: 95%   Weight:  169 lb 4.8 oz (76.8 kg)   Height: 5\' 2"  (1.575 m)     Body mass index is 30.97 kg/m.  Physical Exam   No results found for this or any previous visit (from the past 2160 hour(s)).  Diabetic Foot Exam: Diabetic Foot Exam - Simple   No data filed       PHQ2/9: Depression screen [facility] 2/9 04/25/2019 02/08/2018 08/13/2017 07/13/2017 09/04/2016  Decreased Interest 0 0 0 0 0  Down, Depressed, Hopeless 0 0 0 0 0  PHQ - 2 Score 0 0 0 0 0  Altered sleeping 0 - - - -  Tired, decreased energy 0 - - - -  Change in appetite 0 - - - -  Feeling bad or failure about yourself  0 - - - -  Trouble concentrating 0 - - - -  Moving slowly or fidgety/restless 0 - - - -  Suicidal thoughts 0 - - - -  PHQ-9 Score 0 - - - -  Difficult doing work/chores Not difficult at all - - - -    phq 9 is negative Reviewed today  Fall Risk: Fall Risk  04/25/2019 02/08/2018 08/13/2017 07/13/2017 09/04/2016  Falls in the past year? 0 No No No No  Number falls in past yr: 0 - - - -  Injury with Fall? 0 - - - -     Functional Status Survey: Is the patient deaf or have difficulty hearing?: No Does the patient have difficulty seeing, even when wearing glasses/contacts?: No Does the patient have difficulty concentrating, remembering, or making decisions?: No Does the patient have difficulty walking or climbing stairs?: No Does the patient have difficulty dressing or bathing?: No Does the patient have difficulty doing errands alone such as visiting a doctor's office or shopping?: No    Assessment & Plan:   43 y/o AAF transferring care to me, here today for routine follow up and needs most meds refilled, last labs were over a year ago, her OB GYN helped refill some of her meds.  Problem List Items Addressed This Visit      Cardiovascular  and Mediastinum   Hypertension - Primary    > 5 years hx of HTN, currently on 100 mg Toprol XL BP uncontrolled, today 150/92 even with rechecking in clinic, she is asx Toprol XL has helped with migraines, pt doesn't like HCTZ Plan to have her monitor at home for 1-2 weeks, checking 2x a day Will notify us >140/90 on average, and if so will add ACEI/ARB for renal protection Labs - check renal function       Relevant Medications   atorvastatin (LIPITOR) 20 MG tablet   metoprolol succinate (TOPROL-XL) 100 MG 24 hr tablet   Other Relevant Orders   COMPLETE METABOLIC PANEL WITH GFR   Chronic migraine    Well controlled, rarely has HA's, will use OTC meds      Relevant Medications   atorvastatin (LIPITOR) 20 MG tablet   metoprolol succinate (TOPROL-XL) 100 MG 24 hr tablet     Respiratory   Allergic rhinitis    Controlled with flonase, allegra and PRN use of atrovent nasal spray        Digestive   GERD (gastroesophageal reflux disease)    GERD, fairly severe with daily sx for 7-8 years, currently on prilosec and pepcid, undercontrolled Plan - switch prilosec to protonix and continue pepcid PRN Diet and lifestyle discussed Pt to follow up in 2-4 weeks if not improving with  protonix No NSAID use or ETOH to contribute Definitely triggered by foods Consider GI referral if not controlled - EGD/biopsy would be helpful to r/o other causes      Relevant Medications   pantoprazole (PROTONIX) 20 MG tablet     Other   Hyperlipidemia    On lipitor 20 mg, compliant with meds, no SE Tx by working on diet - avoid trans fat, saturated fats, eat lean meats, add vegetables and whole grains Return for FLP and CMP      Relevant Medications   atorvastatin (LIPITOR) 20 MG tablet   metoprolol succinate (TOPROL-XL) 100 MG 24 hr tablet   Other Relevant Orders   COMPLETE METABOLIC PANEL WITH GFR   Lipid panel   Prediabetes    Dx of T2DM 4 years ago, improved A1C and now diet controlled, last  A1C 5.8% Recheck A1C today, not working on any diet or exercise and weight increased, continue to monitor, advised cutting starches/carbs/simple sugars in diet, weightloss and exercise to continue to improve      Relevant Orders   Hemoglobin A1c    Other Visit Diagnoses    Class 1 obesity with serious comorbidity and body mass index (BMI) of 30.0 to 30.9 in adult, unspecified obesity type       weight increased, diet and exercise as per above, continue to monitor       Return in about 6 months (around 10/26/2019) for cpe, need to return this week for fasting labs, f/up if BP high, and f/up if PPI not helping.   Delsa Grana, PA-C 04/25/19 4:50 PM

## 2019-04-25 NOTE — Assessment & Plan Note (Signed)
Well controlled, rarely has HA's, will use OTC meds

## 2019-04-25 NOTE — Assessment & Plan Note (Signed)
Controlled with flonase, allegra and PRN use of atrovent nasal spray

## 2019-04-25 NOTE — Assessment & Plan Note (Addendum)
GERD, fairly severe with daily sx for 7-8 years, currently on prilosec and pepcid, undercontrolled Plan - switch prilosec to protonix and continue pepcid PRN Diet and lifestyle discussed Pt to follow up in 2-4 weeks if not improving with protonix No NSAID use or ETOH to contribute Definitely triggered by foods Consider GI referral if not controlled - EGD/biopsy would be helpful to r/o other causes

## 2019-04-25 NOTE — Assessment & Plan Note (Addendum)
Dx of T2DM 4 years ago, improved A1C and now diet controlled, last A1C 5.8% Recheck A1C today, not working on any diet or exercise and weight increased, continue to monitor, advised cutting starches/carbs/simple sugars in diet, weightloss and exercise to continue to improve

## 2019-04-25 NOTE — Assessment & Plan Note (Addendum)
>   5 years hx of HTN, currently on 100 mg Toprol XL BP uncontrolled, today 150/92 even with rechecking in clinic, she is asx Toprol XL has helped with migraines, pt doesn't like HCTZ Plan to have her monitor at home for 1-2 weeks, checking 2x a day Will notify us >140/90 on average, and if so will add ACEI/ARB for renal protection Labs - check renal function

## 2019-04-27 ENCOUNTER — Other Ambulatory Visit: Payer: Self-pay | Admitting: Family Medicine

## 2019-04-27 DIAGNOSIS — Z5181 Encounter for therapeutic drug level monitoring: Secondary | ICD-10-CM

## 2019-04-27 DIAGNOSIS — E66811 Obesity, class 1: Secondary | ICD-10-CM

## 2019-04-27 DIAGNOSIS — E782 Mixed hyperlipidemia: Secondary | ICD-10-CM

## 2019-04-27 DIAGNOSIS — E669 Obesity, unspecified: Secondary | ICD-10-CM

## 2019-04-27 DIAGNOSIS — I10 Essential (primary) hypertension: Secondary | ICD-10-CM

## 2019-04-27 DIAGNOSIS — R7303 Prediabetes: Secondary | ICD-10-CM

## 2019-04-27 DIAGNOSIS — Z6830 Body mass index (BMI) 30.0-30.9, adult: Secondary | ICD-10-CM

## 2019-04-27 LAB — COMPLETE METABOLIC PANEL WITH GFR
AG Ratio: 1.7 (calc) (ref 1.0–2.5)
ALT: 15 U/L (ref 6–29)
AST: 14 U/L (ref 10–30)
Albumin: 4.3 g/dL (ref 3.6–5.1)
Alkaline phosphatase (APISO): 60 U/L (ref 31–125)
BUN: 14 mg/dL (ref 7–25)
CO2: 27 mmol/L (ref 20–32)
Calcium: 9.6 mg/dL (ref 8.6–10.2)
Chloride: 102 mmol/L (ref 98–110)
Creat: 0.73 mg/dL (ref 0.50–1.10)
GFR, Est African American: 118 mL/min/{1.73_m2} (ref >=60)
GFR, Est Non African American: 102 mL/min/{1.73_m2} (ref >=60)
Globulin: 2.6 g/dL (calc) (ref 1.9–3.7)
Glucose, Bld: 113 mg/dL — ABNORMAL HIGH (ref 65–99)
Potassium: 3.8 mmol/L (ref 3.5–5.3)
Sodium: 138 mmol/L (ref 135–146)
Total Bilirubin: 0.4 mg/dL (ref 0.2–1.2)
Total Protein: 6.9 g/dL (ref 6.1–8.1)

## 2019-04-27 LAB — LIPID PANEL
Cholesterol: 153 mg/dL (ref <200)
HDL: 61 mg/dL (ref >=50)
LDL Cholesterol (Calc): 77 mg/dL (calc)
Non-HDL Cholesterol (Calc): 92 mg/dL (calc) (ref <130)
Total CHOL/HDL Ratio: 2.5 (calc) (ref <5.0)
Triglycerides: 69 mg/dL (ref <150)

## 2019-04-27 LAB — HEMOGLOBIN A1C
Hgb A1c MFr Bld: 6.2 % of total Hgb — ABNORMAL HIGH (ref <5.7)
Mean Plasma Glucose: 131 (calc)
eAG (mmol/L): 7.3 (calc)

## 2019-04-30 ENCOUNTER — Other Ambulatory Visit: Payer: Self-pay | Admitting: Family Medicine

## 2019-05-08 ENCOUNTER — Encounter: Payer: Self-pay | Admitting: Family Medicine

## 2019-05-13 ENCOUNTER — Ambulatory Visit: Payer: BC Managed Care – PPO | Admitting: Family Medicine

## 2019-05-13 ENCOUNTER — Encounter: Payer: Self-pay | Admitting: Family Medicine

## 2019-05-13 ENCOUNTER — Other Ambulatory Visit: Payer: Self-pay

## 2019-05-13 VITALS — BP 164/92 | HR 90 | Temp 97.3°F | Resp 14 | Wt 164.9 lb

## 2019-05-13 DIAGNOSIS — I10 Essential (primary) hypertension: Secondary | ICD-10-CM

## 2019-05-13 MED ORDER — AMLODIPINE BESYLATE 10 MG PO TABS: 10.0000 mg | ORAL_TABLET | Freq: Every day | ORAL | 3 refills | Status: DC

## 2019-05-13 NOTE — Patient Instructions (Signed)
Hope to get your blood pressure down to 130/80   Hypertension, Adult Hypertension is another name for high blood pressure. High blood pressure forces your heart to work harder to pump blood. This can cause problems over time. There are two numbers in a blood pressure reading. There is a top number (systolic) over a bottom number (diastolic). It is best to have a blood pressure that is below 120/80. Healthy choices can help lower your blood pressure, or you may need medicine to help lower it. What are the causes? The cause of this condition is not known. Some conditions may be related to high blood pressure. What increases the risk?  Smoking.  Having type 2 diabetes mellitus, high cholesterol, or both.  Not getting enough exercise or physical activity.  Being overweight.  Having too much fat, sugar, calories, or salt (sodium) in your diet.  Drinking too much alcohol.  Having long-term (chronic) kidney disease.  Having a family history of high blood pressure.  Age. Risk increases with age.  Race. You may be at higher risk if you are African American.  Gender. Men are at higher risk than women before age 41. After age 63, women are at higher risk than men.  Having obstructive sleep apnea.  Stress. What are the signs or symptoms?  High blood pressure may not cause symptoms. Very high blood pressure (hypertensive crisis) may cause: ? Headache. ? Feelings of worry or nervousness (anxiety). ? Shortness of breath. ? Nosebleed. ? A feeling of being sick to your stomach (nausea). ? Throwing up (vomiting). ? Changes in how you see. ? Very bad chest pain. ? Seizures. How is this treated?  This condition is treated by making healthy lifestyle changes, such as: ? Eating healthy foods. ? Exercising more. ? Drinking less alcohol.  Your health care provider may prescribe medicine if lifestyle changes are not enough to get your blood pressure under control, and if: ? Your top  number is above 130. ? Your bottom number is above 80.  Your personal target blood pressure may vary. Follow these instructions at home: Eating and drinking   If told, follow the DASH eating plan. To follow this plan: ? Fill one half of your plate at each meal with fruits and vegetables. ? Fill one fourth of your plate at each meal with whole grains. Whole grains include whole-wheat pasta, brown rice, and whole-grain bread. ? Eat or drink low-fat dairy products, such as skim milk or low-fat yogurt. ? Fill one fourth of your plate at each meal with low-fat (lean) proteins. Low-fat proteins include fish, chicken without skin, eggs, beans, and tofu. ? Avoid fatty meat, cured and processed meat, or chicken with skin. ? Avoid pre-made or processed food.  Eat less than 1,500 mg of salt each day.  Do not drink alcohol if: ? Your doctor tells you not to drink. ? You are pregnant, may be pregnant, or are planning to become pregnant.  If you drink alcohol: ? Limit how much you use to:  0-1 drink a day for women.  0-2 drinks a day for men. ? Be aware of how much alcohol is in your drink. In the [place] one drink equals one 12 oz bottle of beer (355 mL), one 5 oz glass of wine (148 mL), or one 1 oz glass of hard liquor (44 mL). Lifestyle   Work with your doctor to stay at a healthy weight or to lose weight. Ask your doctor what the best weight is  for you.  Get at least 30 minutes of exercise most days of the week. This may include walking, swimming, or biking.  Get at least 30 minutes of exercise that strengthens your muscles (resistance exercise) at least 3 days a week. This may include lifting weights or doing Pilates.  Do not use any products that contain nicotine or tobacco, such as cigarettes, e-cigarettes, and chewing tobacco. If you need help quitting, ask your doctor.  Check your blood pressure at home as told by your doctor.  Keep all follow-up visits as told by your doctor.  This is important. Medicines  Take over-the-counter and prescription medicines only as told by your doctor. Follow directions carefully.  Do not skip doses of blood pressure medicine. The medicine does not work as well if you skip doses. Skipping doses also puts you at risk for problems.  Ask your doctor about side effects or reactions to medicines that you should watch for. Contact a doctor if you:  Think you are having a reaction to the medicine you are taking.  Have headaches that keep coming back (recurring).  Feel dizzy.  Have swelling in your ankles.  Have trouble with your vision. Get help right away if you:  Get a very bad headache.  Start to feel mixed up (confused).  Feel weak or numb.  Feel faint.  Have very bad pain in your: ? Chest. ? Belly (abdomen).  Throw up more than once.  Have trouble breathing. Summary  Hypertension is another name for high blood pressure.  High blood pressure forces your heart to work harder to pump blood.  For most people, a normal blood pressure is less than 120/80.  Making healthy choices can help lower blood pressure. If your blood pressure does not get lower with healthy choices, you may need to take medicine. This information is not intended to replace advice given to you by your health care provider. Make sure you discuss any questions you have with your health care provider. Document Released: 02/04/2008 Document Revised: 04/28/2018 Document Reviewed: 04/28/2018 Elsevier Patient Education  2020 [employer].

## 2019-05-13 NOTE — Progress Notes (Signed)
Patient ID: Carrie Cross, female    DOB: [birth year], 43 y.o.   MRN: [id number]  PCP: Delsa Grana, PA-C  Chief Complaint  Patient presents with  . Hypertension    per patient at home bp running really high, denies chest pain or SOB.    Subjective:   Carrie Cross is a 43 y.o. female, presents to clinic with CC of the following:  Patient had return for BP recheck after monitoring at home for the past couple weeks.  Her blood pressure monitor has been extremely high reading over 200.  She did not call with any readings because she did not believe that they were accurate.  Patient denies any chest pain, shortness of breath, palpitations, lower extremity edema, near syncope, headaches or visual disturbances no focal numbness weakness or tingling. Blood pressure here today continues to be elevated similar to last visit. BP Readings from Last 3 Encounters:  05/13/19 (!) 164/92  04/25/19 (!) 150/92  10/26/18 138/84      Patient Active Problem List   Diagnosis Date Noted  . Prediabetes 04/25/2019  . Class 1 obesity with serious comorbidity and body mass index (BMI) of 30.0 to 30.9 in adult 04/25/2019  . Herpes simplex 08/10/2015  . Eczema 05/11/2015  . Hypertension   . Hyperlipidemia   . GERD (gastroesophageal reflux disease)   . Allergic rhinitis   . Chronic migraine       Current Outpatient Medications:  .  atorvastatin (LIPITOR) 20 MG tablet, Take 1 tablet (20 mg total) by mouth at bedtime., Disp: 90 tablet, Rfl: 1 .  famotidine (PEPCID) 40 MG tablet, Take 1 tablet (40 mg total) by mouth daily., Disp: 90 tablet, Rfl: 3 .  fexofenadine (ALLEGRA) 180 MG tablet, TAKE 1 TABLET BY MOUTH EVERY DAY AS NEEDED FOR ALLERGIES OR SINUS, Disp: 90 tablet, Rfl: 3 .  fluticasone (FLONASE) 50 MCG/ACT nasal spray, USE 2 SPRAYS IN EACH       NOSTRIL DAILY., Disp: 48 g, Rfl: 1 .  ipratropium (ATROVENT) 0.03 % nasal spray, Place 2 sprays into both nostrils 3 (three) times daily.,  Disp: 30 mL, Rfl: 12 .  levonorgestrel (MIRENA) 20 MCG/24HR IUD, by Intrauterine route. Reported on 12/13/2015, Disp: , Rfl:  .  metoprolol succinate (TOPROL-XL) 100 MG 24 hr tablet, Take 1 tablet (100 mg total) by mouth daily. Take with or immediately following a meal., Disp: 90 tablet, Rfl: 1 .  pantoprazole (PROTONIX) 20 MG tablet, Take 1 tablet (20 mg total) by mouth daily., Disp: 90 tablet, Rfl: 1   No Known Allergies   Family History  Problem Relation Age of Onset  . Breast cancer Maternal Grandmother 80  . Heart disease Maternal Grandfather   . Stroke Neg Hx   . Diabetes Neg Hx   . Ovarian cancer Neg Hx   . Colon cancer Neg Hx      Social History   Socioeconomic History  . Marital status: Married    Spouse name: Not on file  . Number of children: Not on file  . Years of education: Not on file  . Highest education level: Not on file  Occupational History  . Not on file  Social Needs  . Financial resource strain: Somewhat hard  . Food insecurity    Worry: Never true    Inability: Never true  . Transportation needs    Medical: No    Non-medical: No  Tobacco Use  . Smoking status: Former Smoker  Packs/day: 0.50    Years: 7.00    Pack years: 3.50    Types: Cigarettes    Quit date: 06/02/2011    Years since quitting: 7.9  . Smokeless tobacco: Never Used  Substance and Sexual Activity  . Alcohol use: Yes    Alcohol/week: 3.0 standard drinks    Types: 3 Glasses of wine per week  . Drug use: No  . Sexual activity: Yes    Birth control/protection: I.U.D.    Comment: mirena  Lifestyle  . Physical activity    Days per week: 3 days    Minutes per session: 90 min  . Stress: Only a little  Relationships  . Social Herbalist on phone: Not on file    Gets together: Not on file    Attends religious service: Not on file    Active member of club or organization: Not on file    Attends meetings of clubs or organizations: Not on file    Relationship  status: Married  . Intimate partner violence    Fear of current or ex partner: No    Emotionally abused: No    Physically abused: No    Forced sexual activity: No  Other Topics Concern  . Not on file  Social History Narrative  . Not on file      Review of Systems  Constitutional: Negative.   HENT: Negative.   Eyes: Negative.   Respiratory: Negative.   Cardiovascular: Negative.   Gastrointestinal: Negative.   Endocrine: Negative.   Genitourinary: Negative.   Musculoskeletal: Negative.   Skin: Negative.   Allergic/Immunologic: Negative.   Neurological: Negative.   Hematological: Negative.   Psychiatric/Behavioral: Negative.   All other systems reviewed and are negative.      Objective:   Vitals:   05/13/19 1451  Pulse: 90  Resp: 14  Temp: (!) 97.3 F (36.3 C)  SpO2: 99%  Weight: 164 lb 14.4 oz (74.8 kg)    Body mass index is 30.16 kg/m.  Physical Exam Vitals signs and nursing note reviewed.  Constitutional:      General: She is not in acute distress.    Appearance: Normal appearance. She is well-developed. She is not ill-appearing, toxic-appearing or diaphoretic.  HENT:     Head: Normocephalic and atraumatic.     Nose: Nose normal.     Mouth/Throat:     Mouth: Mucous membranes are moist.     Pharynx: Oropharynx is clear.  Eyes:     General:        Right eye: No discharge.        Left eye: No discharge.     Conjunctiva/sclera: Conjunctivae normal.     Pupils: Pupils are equal, round, and reactive to light.  Neck:     Trachea: No tracheal deviation.  Cardiovascular:     Rate and Rhythm: Normal rate and regular rhythm.     Pulses: Normal pulses.     Heart sounds: Normal heart sounds. No murmur. No friction rub. No gallop.   Pulmonary:     Effort: Pulmonary effort is normal. No respiratory distress.     Breath sounds: No stridor.  Musculoskeletal: Normal range of motion.     Right lower leg: No edema.     Left lower leg: No edema.  Skin:     General: Skin is warm and dry.     Capillary Refill: Capillary refill takes less than 2 seconds.     Findings: No rash.  Neurological:     Mental Status: She is alert.     Motor: No abnormal muscle tone.     Coordination: Coordination normal.  Psychiatric:        Behavior: Behavior normal.      Results for orders placed or performed in visit on 04/25/19  COMPLETE METABOLIC PANEL WITH GFR  Result Value Ref Range   Glucose, Bld 113 (H) 65 - 99 mg/dL   BUN 14 7 - 25 mg/dL   Creat 0.73 0.50 - 1.10 mg/dL   GFR, Est Non African American 102 > OR = 60 mL/min/1.14m2   GFR, Est African American 118 > OR = 60 mL/min/1.22m2   BUN/Creatinine Ratio NOT APPLICABLE 6 - 22 (calc)   Sodium 138 135 - 146 mmol/L   Potassium 3.8 3.5 - 5.3 mmol/L   Chloride 102 98 - 110 mmol/L   CO2 27 20 - 32 mmol/L   Calcium 9.6 8.6 - 10.2 mg/dL   Total Protein 6.9 6.1 - 8.1 g/dL   Albumin 4.3 3.6 - 5.1 g/dL   Globulin 2.6 1.9 - 3.7 g/dL (calc)   AG Ratio 1.7 1.0 - 2.5 (calc)   Total Bilirubin 0.4 0.2 - 1.2 mg/dL   Alkaline phosphatase (APISO) 60 31 - 125 U/L   AST 14 10 - 30 U/L   ALT 15 6 - 29 U/L  Lipid panel  Result Value Ref Range   Cholesterol 153 <200 mg/dL   HDL 61 > OR = 50 mg/dL   Triglycerides 69 <150 mg/dL   LDL Cholesterol (Calc) 77 mg/dL (calc)   Total CHOL/HDL Ratio 2.5 <5.0 (calc)   Non-HDL Cholesterol (Calc) 92 <130 mg/dL (calc)  Hemoglobin A1c  Result Value Ref Range   Hgb A1c MFr Bld 6.2 (H) <5.7 % of total Hgb   Mean Plasma Glucose 131 (calc)   eAG (mmol/L) 7.3 (calc)        Assessment & Plan:      ICD-10-CM   1. Essential hypertension  I10    uncontrolled, add norvasc    Patient here for recheck of blood pressure with unfortunately continued uncontrolled hypertension.  No concern for endorgan damage she is asymptomatic.  She was previously on HCTZ but she really did not like that medication so it was discontinued she is continue to take metoprolol, will add Norvasc 10 mg on  and recheck in a few weeks. Patient is considering buying a new blood pressure monitor at home, but she also can asked the nurse at work to check her blood pressure for her.  I reviewed with her concerning signs and symptoms of hypertensive urgency and red flags which she should go to the ER for immediately.  She verbalized understanding and will monitor and follow-up in a few weeks.  I also discussed possibly needing to add on losartan with Norvasc for renal protection and we discussed those medications and follow-up rechecking of renal function.  Her labs were checked recently and renal function was excellent    Delsa Grana, PA-C 05/13/19 2:58 PM

## 2019-06-13 ENCOUNTER — Encounter: Payer: Self-pay | Admitting: Family Medicine

## 2019-06-13 ENCOUNTER — Other Ambulatory Visit: Payer: Self-pay

## 2019-06-13 ENCOUNTER — Ambulatory Visit: Payer: BC Managed Care – PPO | Admitting: Family Medicine

## 2019-06-13 VITALS — BP 132/74 | HR 87 | Temp 98.3°F | Resp 14 | Ht 64.0 in | Wt 161.8 lb

## 2019-06-13 DIAGNOSIS — I10 Essential (primary) hypertension: Secondary | ICD-10-CM

## 2019-06-13 NOTE — Patient Instructions (Signed)
Blood pressure looks great!  Keep checking it periodically and if it is ever too high or too low let us know - other wise we can see you in 4-6 months to recheck everything.  BP goal >100/60 and <140/90  Call use sooner if any concerns or side effects.   Managing Your Hypertension Hypertension is commonly called high blood pressure. This is when the force of your blood pressing against the walls of your arteries is too strong. Arteries are blood vessels that carry blood from your heart throughout your body. Hypertension forces the heart to work harder to pump blood, and may cause the arteries to become narrow or stiff. Having untreated or uncontrolled hypertension can cause heart attack, stroke, kidney disease, and other problems. What are blood pressure readings? A blood pressure reading consists of a higher number over a lower number. Ideally, your blood pressure should be below 120/80. The first ("top") number is called the systolic pressure. It is a measure of the pressure in your arteries as your heart beats. The second ("bottom") number is called the diastolic pressure. It is a measure of the pressure in your arteries as the heart relaxes. What does my blood pressure reading mean? Blood pressure is classified into four stages. Based on your blood pressure reading, your health care provider may use the following stages to determine what type of treatment you need, if any. Systolic pressure and diastolic pressure are measured in a unit called mm Hg. Normal  Systolic pressure: below 123456.  Diastolic pressure: below 80. Elevated  Systolic pressure: [id number].  Diastolic pressure: below 80. Hypertension stage 1  Systolic pressure: 0000000.  Diastolic pressure: [id number]. Hypertension stage 2  Systolic pressure: [id number] or above.  Diastolic pressure: 90 or above. What health risks are associated with hypertension? Managing your hypertension is an important responsibility. Uncontrolled  hypertension can lead to:  A heart attack.  A stroke.  A weakened blood vessel (aneurysm).  Heart failure.  Kidney damage.  Eye damage.  Metabolic syndrome.  Memory and concentration problems. What changes can I make to manage my hypertension? Hypertension can be managed by making lifestyle changes and possibly by taking medicines. Your health care provider will help you make a plan to bring your blood pressure within a normal range. Eating and drinking   Eat a diet that is high in fiber and potassium, and low in salt (sodium), added sugar, and fat. An example eating plan is called the DASH (Dietary Approaches to Stop Hypertension) diet. To eat this way: ? Eat plenty of fresh fruits and vegetables. Try to fill half of your plate at each meal with fruits and vegetables. ? Eat whole grains, such as whole wheat pasta, brown rice, or whole grain bread. Fill about one quarter of your plate with whole grains. ? Eat low-fat diary products. ? Avoid fatty cuts of meat, processed or cured meats, and poultry with skin. Fill about one quarter of your plate with lean proteins such as fish, chicken without skin, beans, eggs, and tofu. ? Avoid premade and processed foods. These tend to be higher in sodium, added sugar, and fat.  Reduce your daily sodium intake. Most people with hypertension should eat less than 1,500 mg of sodium a day.  Limit alcohol intake to no more than 1 drink a day for nonpregnant women and 2 drinks a day for men. One drink equals 12 oz of beer, 5 oz of wine, or 1 oz of hard liquor. Lifestyle  Work with your  health care provider to maintain a healthy body weight, or to lose weight. Ask what an ideal weight is for you.  Get at least 30 minutes of exercise that causes your heart to beat faster (aerobic exercise) most days of the week. Activities may include walking, swimming, or biking.  Include exercise to strengthen your muscles (resistance exercise), such as weight  lifting, as part of your weekly exercise routine. Try to do these types of exercises for 30 minutes at least 3 days a week.  Do not use any products that contain nicotine or tobacco, such as cigarettes and e-cigarettes. If you need help quitting, ask your health care provider.  Control any long-term (chronic) conditions you have, such as high cholesterol or diabetes. Monitoring  Monitor your blood pressure at home as told by your health care provider. Your personal target blood pressure may vary depending on your medical conditions, your age, and other factors.  Have your blood pressure checked regularly, as often as told by your health care provider. Working with your health care provider  Review all the medicines you take with your health care provider because there may be side effects or interactions.  Talk with your health care provider about your diet, exercise habits, and other lifestyle factors that may be contributing to hypertension.  Visit your health care provider regularly. Your health care provider can help you create and adjust your plan for managing hypertension. Will I need medicine to control my blood pressure? Your health care provider may prescribe medicine if lifestyle changes are not enough to get your blood pressure under control, and if:  Your systolic blood pressure is 130 or higher.  Your diastolic blood pressure is 80 or higher. Take medicines only as told by your health care provider. Follow the directions carefully. Blood pressure medicines must be taken as prescribed. The medicine does not work as well when you skip doses. Skipping doses also puts you at risk for problems. Contact a health care provider if:  You think you are having a reaction to medicines you have taken.  You have repeated (recurrent) headaches.  You feel dizzy.  You have swelling in your ankles.  You have trouble with your vision. Get help right away if:  You develop a severe  headache or confusion.  You have unusual weakness or numbness, or you feel faint.  You have severe pain in your chest or abdomen.  You vomit repeatedly.  You have trouble breathing. Summary  Hypertension is when the force of blood pumping through your arteries is too strong. If this condition is not controlled, it may put you at risk for serious complications.  Your personal target blood pressure may vary depending on your medical conditions, your age, and other factors. For most people, a normal blood pressure is less than 120/80.  Hypertension is managed by lifestyle changes, medicines, or both. Lifestyle changes include weight loss, eating a healthy, low-sodium diet, exercising more, and limiting alcohol. This information is not intended to replace advice given to you by your health care provider. Make sure you discuss any questions you have with your health care provider. Document Released: 05/12/2012 Document Revised: 12/10/2018 Document Reviewed: 07/16/2016 Elsevier Patient Education  2020 [employer].

## 2019-06-13 NOTE — Progress Notes (Signed)
Patient ID: Carrie Cross, female    DOB: Aug 28, 1976, 43 y.o.   MRN: [id number]  PCP: Delsa Grana, PA-C  Chief Complaint  Patient presents with  . Follow-up  . Hypertension    Subjective:   Carrie Cross is a 43 y.o. female, presents to clinic with CC of the following:  HPI  Patient here for blood pressure recheck, we have been increasing medications for uncontrolled hypertension.  She is still taking metoprolol 100 mg daily without any concerns or side effects.  Over the last 2 months we have started amlodipine and then titrated dose up to 10 mg daily.  Her blood pressure is well controlled today.  She is not having any side effects or concerns with the Norvasc.  Denies any chest pain, shortness of breath, near syncope, lower extremity edema, palpitations.  She is continuing to try and work on a healthy diet and exercise.  She did get her flu shot done at [facility] and her health maintenance was updated to reflect this  Patient Active Problem List   Diagnosis Date Noted  . Prediabetes 04/25/2019  . Class 1 obesity with serious comorbidity and body mass index (BMI) of 30.0 to 30.9 in adult 04/25/2019  . Herpes simplex 08/10/2015  . Eczema 05/11/2015  . Hypertension   . Hyperlipidemia   . GERD (gastroesophageal reflux disease)   . Allergic rhinitis   . Chronic migraine       Current Outpatient Medications:  .  amLODipine (NORVASC) 10 MG tablet, Take 1 tablet (10 mg total) by mouth daily., Disp: 90 tablet, Rfl: 3 .  atorvastatin (LIPITOR) 20 MG tablet, Take 1 tablet (20 mg total) by mouth at bedtime., Disp: 90 tablet, Rfl: 1 .  famotidine (PEPCID) 40 MG tablet, Take 1 tablet (40 mg total) by mouth daily., Disp: 90 tablet, Rfl: 3 .  fexofenadine (ALLEGRA) 180 MG tablet, TAKE 1 TABLET BY MOUTH EVERY DAY AS NEEDED FOR ALLERGIES OR SINUS, Disp: 90 tablet, Rfl: 3 .  fluticasone (FLONASE) 50 MCG/ACT nasal spray, USE 2 SPRAYS IN EACH       NOSTRIL DAILY., Disp: 48 g,  Rfl: 1 .  ipratropium (ATROVENT) 0.03 % nasal spray, Place 2 sprays into both nostrils 3 (three) times daily., Disp: 30 mL, Rfl: 12 .  levonorgestrel (MIRENA) 20 MCG/24HR IUD, by Intrauterine route. Reported on 12/13/2015, Disp: , Rfl:  .  metoprolol succinate (TOPROL-XL) 100 MG 24 hr tablet, Take 1 tablet (100 mg total) by mouth daily. Take with or immediately following a meal., Disp: 90 tablet, Rfl: 1 .  pantoprazole (PROTONIX) 20 MG tablet, Take 1 tablet (20 mg total) by mouth daily., Disp: 90 tablet, Rfl: 1   No Known Allergies   Family History  Problem Relation Age of Onset  . Breast cancer Maternal Grandmother 80  . Heart disease Maternal Grandfather   . Stroke Neg Hx   . Diabetes Neg Hx   . Ovarian cancer Neg Hx   . Colon cancer Neg Hx      Social History   Socioeconomic History  . Marital status: Married    Spouse name: Not on file  . Number of children: Not on file  . Years of education: Not on file  . Highest education level: Not on file  Occupational History  . Not on file  Social Needs  . Financial resource strain: Somewhat hard  . Food insecurity    Worry: Never true    Inability: Never true  .  Transportation needs    Medical: No    Non-medical: No  Tobacco Use  . Smoking status: Former Smoker    Packs/day: 0.50    Years: 7.00    Pack years: 3.50    Types: Cigarettes    Quit date: 06/02/2011    Years since quitting: 8.0  . Smokeless tobacco: Never Used  Substance and Sexual Activity  . Alcohol use: Yes    Alcohol/week: 3.0 standard drinks    Types: 3 Glasses of wine per week  . Drug use: No  . Sexual activity: Yes    Birth control/protection: I.U.D.    Comment: mirena  Lifestyle  . Physical activity    Days per week: 3 days    Minutes per session: 90 min  . Stress: Only a little  Relationships  . Social Herbalist on phone: Not on file    Gets together: Not on file    Attends religious service: Not on file    Active member of club  or organization: Not on file    Attends meetings of clubs or organizations: Not on file    Relationship status: Married  . Intimate partner violence    Fear of current or ex partner: No    Emotionally abused: No    Physically abused: No    Forced sexual activity: No  Other Topics Concern  . Not on file  Social History Narrative  . Not on file    I personally reviewed medication list, allergies, health maintenance, notes from last encounter, lab results with the patient/caregiver today.  Review of Systems  Constitutional: Negative.   HENT: Negative.   Eyes: Negative.   Respiratory: Negative.   Cardiovascular: Negative.   Gastrointestinal: Negative.   Endocrine: Negative.   Genitourinary: Negative.   Musculoskeletal: Negative.   Skin: Negative.   Allergic/Immunologic: Negative.   Neurological: Negative.   Hematological: Negative.   Psychiatric/Behavioral: Negative.   All other systems reviewed and are negative.      Objective:   Vitals:   06/13/19 1504  BP: 132/74  Pulse: 87  Resp: 14  Temp: 98.3 F (36.8 C)  SpO2: 97%  Weight: 161 lb 12.8 oz (73.4 kg)  Height: 5\' 4"  (1.626 m)    Body mass index is 27.77 kg/m.  Physical Exam Vitals signs and nursing note reviewed.  Constitutional:      General: She is not in acute distress.    Appearance: Normal appearance. She is well-developed. She is not ill-appearing, toxic-appearing or diaphoretic.     Interventions: Face mask in place.  HENT:     Head: Normocephalic and atraumatic.     Right Ear: External ear normal.     Left Ear: External ear normal.  Eyes:     General: Lids are normal. No scleral icterus.       Right eye: No discharge.        Left eye: No discharge.     Conjunctiva/sclera: Conjunctivae normal.  Neck:     Musculoskeletal: Normal range of motion and neck supple.     Trachea: Phonation normal. No tracheal deviation.  Cardiovascular:     Rate and Rhythm: Normal rate and regular rhythm.      Pulses: Normal pulses.          Radial pulses are 2+ on the right side and 2+ on the left side.       Posterior tibial pulses are 2+ on the right side and 2+ on the  left side.     Heart sounds: Normal heart sounds. No murmur. No friction rub. No gallop.   Pulmonary:     Effort: Pulmonary effort is normal. No respiratory distress.     Breath sounds: Normal breath sounds. No stridor. No wheezing, rhonchi or rales.  Chest:     Chest wall: No tenderness.  Abdominal:     General: Bowel sounds are normal. There is no distension.     Palpations: Abdomen is soft.     Tenderness: There is no abdominal tenderness. There is no guarding or rebound.  Musculoskeletal: Normal range of motion.        General: No deformity.     Right lower leg: No edema.     Left lower leg: No edema.  Lymphadenopathy:     Cervical: No cervical adenopathy.  Skin:    General: Skin is warm and dry.     Capillary Refill: Capillary refill takes less than 2 seconds.     Coloration: Skin is not jaundiced or pale.     Findings: No rash.  Neurological:     Mental Status: She is alert and oriented to person, place, and time.     Motor: No abnormal muscle tone.     Gait: Gait normal.  Psychiatric:        Speech: Speech normal.        Behavior: Behavior normal.      Results for orders placed or performed in visit on 04/25/19  COMPLETE METABOLIC PANEL WITH GFR  Result Value Ref Range   Glucose, Bld 113 (H) 65 - 99 mg/dL   BUN 14 7 - 25 mg/dL   Creat 0.73 0.50 - 1.10 mg/dL   GFR, Est Non African American 102 > OR = 60 mL/min/1.49m2   GFR, Est African American 118 > OR = 60 mL/min/1.76m2   BUN/Creatinine Ratio NOT APPLICABLE 6 - 22 (calc)   Sodium 138 135 - 146 mmol/L   Potassium 3.8 3.5 - 5.3 mmol/L   Chloride 102 98 - 110 mmol/L   CO2 27 20 - 32 mmol/L   Calcium 9.6 8.6 - 10.2 mg/dL   Total Protein 6.9 6.1 - 8.1 g/dL   Albumin 4.3 3.6 - 5.1 g/dL   Globulin 2.6 1.9 - 3.7 g/dL (calc)   AG Ratio 1.7 1.0 - 2.5  (calc)   Total Bilirubin 0.4 0.2 - 1.2 mg/dL   Alkaline phosphatase (APISO) 60 31 - 125 U/L   AST 14 10 - 30 U/L   ALT 15 6 - 29 U/L  Lipid panel  Result Value Ref Range   Cholesterol 153 <200 mg/dL   HDL 61 > OR = 50 mg/dL   Triglycerides 69 <150 mg/dL   LDL Cholesterol (Calc) 77 mg/dL (calc)   Total CHOL/HDL Ratio 2.5 <5.0 (calc)   Non-HDL Cholesterol (Calc) 92 <130 mg/dL (calc)  Hemoglobin A1c  Result Value Ref Range   Hgb A1c MFr Bld 6.2 (H) <5.7 % of total Hgb   Mean Plasma Glucose 131 (calc)   eAG (mmol/L) 7.3 (calc)        Assessment & Plan:      ICD-10-CM   1. Essential hypertension  I10     Well-controlled and at goal, continue Norvasc 10 mg daily and metoprolol xl 100 mg, discussed blood pressure parameters and encouraged her to follow-up with her blood pressure is lower than 100/60 or greater than 140/90, will have her follow-up here in 6 months to recheck labs and  her other chronic conditions  Return in about 6 months (around 12/12/2019) for Routine follow-up.   Delsa Grana, PA-C 06/13/19 3:27 PM

## 2019-08-08 ENCOUNTER — Other Ambulatory Visit: Payer: Self-pay | Admitting: Family Medicine

## 2019-08-08 DIAGNOSIS — I10 Essential (primary) hypertension: Secondary | ICD-10-CM

## 2019-08-15 ENCOUNTER — Encounter: Payer: Self-pay | Admitting: Family Medicine

## 2019-08-16 MED ORDER — CLOBETASOL PROPIONATE 0.05 % EX OINT: 1.0000 "application " | TOPICAL_OINTMENT | Freq: Two times a day (BID) | CUTANEOUS | 2 refills | Status: DC

## 2019-09-26 ENCOUNTER — Encounter: Payer: BC Managed Care – PPO | Admitting: Certified Nurse Midwife

## 2019-09-29 ENCOUNTER — Other Ambulatory Visit: Payer: Self-pay | Admitting: Family Medicine

## 2019-09-29 DIAGNOSIS — I10 Essential (primary) hypertension: Secondary | ICD-10-CM

## 2019-10-09 ENCOUNTER — Other Ambulatory Visit: Payer: Self-pay | Admitting: Family Medicine

## 2019-10-09 DIAGNOSIS — E782 Mixed hyperlipidemia: Secondary | ICD-10-CM

## 2019-10-09 DIAGNOSIS — K219 Gastro-esophageal reflux disease without esophagitis: Secondary | ICD-10-CM

## 2019-10-21 ENCOUNTER — Encounter: Payer: BC Managed Care – PPO | Admitting: Certified Nurse Midwife

## 2019-10-26 ENCOUNTER — Encounter: Payer: BC Managed Care – PPO | Admitting: Family Medicine

## 2019-10-30 ENCOUNTER — Other Ambulatory Visit: Payer: Self-pay | Admitting: Family Medicine

## 2019-11-04 ENCOUNTER — Other Ambulatory Visit: Payer: Self-pay | Admitting: Family Medicine

## 2019-11-04 NOTE — Telephone Encounter (Signed)
Requested Prescriptions  Pending Prescriptions Disp Refills  . fexofenadine (ALLEGRA) 180 MG tablet [Pharmacy Med Name: FEXOFENADINE 180MG  TABLETS (OTC)] 90 tablet 0    Sig: TAKE 1 TABLET BY MOUTH EVERY DAY FOR ALLERGIES OR SINUS     Ear, Nose, and Throat:  Antihistamines Passed - 11/04/2019  3:39 AM      Passed - Valid encounter within last 12 months    Recent Outpatient Visits          4 months ago Essential hypertension   [facility] Delsa Grana, PA-C   5 months ago Essential hypertension   [facility] Delsa Grana, PA-C   6 months ago Essential hypertension   [place], Bethel Born, NP   1 year ago Essential hypertension   [place], Satira Anis, MD   2 years ago Preventative health care   [address], Satira Anis, MD      Future Appointments            In 1 week Delsa Grana, PA-C [facility] [place]   In 1 month Delsa Grana, PA-C [facility]   In 1 month [place], Lara Mulch, CNM [facility]

## 2019-11-08 ENCOUNTER — Other Ambulatory Visit: Payer: Self-pay | Admitting: Family Medicine

## 2019-11-08 DIAGNOSIS — K219 Gastro-esophageal reflux disease without esophagitis: Secondary | ICD-10-CM

## 2019-11-08 NOTE — Telephone Encounter (Signed)
Last office visit discussed was 04/25/19

## 2019-11-14 ENCOUNTER — Other Ambulatory Visit: Payer: Self-pay | Admitting: Family Medicine

## 2019-11-14 DIAGNOSIS — E782 Mixed hyperlipidemia: Secondary | ICD-10-CM

## 2019-11-14 NOTE — Telephone Encounter (Signed)
Refill request for general medication.  Last office visit: 06/13/19  No follow-ups on file.   6 mo ago 1 yr ago   Cholesterol <200 mg/dL 153  140   HDL > OR = 50 mg/dL 61  60 R   Triglycerides <150 mg/dL 69  48   LDL Cholesterol (Calc) mg/dL (calc) 77  66 CM   Comment: Reference range: <100

## 2019-11-15 LAB — LIPID PANEL
Cholesterol: 139 mg/dL (ref <200)
HDL: 59 mg/dL (ref >=50)
LDL Cholesterol (Calc): 66 mg/dL (calc)
Non-HDL Cholesterol (Calc): 80 mg/dL (calc) (ref <130)
Total CHOL/HDL Ratio: 2.4 (calc) (ref <5.0)
Triglycerides: 48 mg/dL (ref <150)

## 2019-11-15 LAB — CBC WITH DIFFERENTIAL/PLATELET
Absolute Monocytes: 367 cells/uL (ref 200–950)
Basophils Absolute: 31 cells/uL (ref 0–200)
Basophils Relative: 0.4 %
Eosinophils Absolute: 78 cells/uL (ref 15–500)
Eosinophils Relative: 1 %
HCT: 36.7 % (ref 35.0–45.0)
Hemoglobin: 12.5 g/dL (ref 11.7–15.5)
Lymphs Abs: 2683 cells/uL (ref 850–3900)
MCH: 31.1 pg (ref 27.0–33.0)
MCHC: 34.1 g/dL (ref 32.0–36.0)
MCV: 91.3 fL (ref 80.0–100.0)
MPV: 9 fL (ref 7.5–12.5)
Monocytes Relative: 4.7 %
Neutro Abs: 4641 cells/uL (ref 1500–7800)
Neutrophils Relative %: 59.5 %
Platelets: 406 10*3/uL — ABNORMAL HIGH (ref 140–400)
RBC: 4.02 10*6/uL (ref 3.80–5.10)
RDW: 12.7 % (ref 11.0–15.0)
Total Lymphocyte: 34.4 %
WBC: 7.8 10*3/uL (ref 3.8–10.8)

## 2019-11-15 LAB — COMPLETE METABOLIC PANEL WITH GFR
AG Ratio: 1.9 (calc) (ref 1.0–2.5)
ALT: 12 U/L (ref 6–29)
AST: 12 U/L (ref 10–30)
Albumin: 4.7 g/dL (ref 3.6–5.1)
Alkaline phosphatase (APISO): 77 U/L (ref 31–125)
BUN: 14 mg/dL (ref 7–25)
CO2: 24 mmol/L (ref 20–32)
Calcium: 9.7 mg/dL (ref 8.6–10.2)
Chloride: 102 mmol/L (ref 98–110)
Creat: 0.65 mg/dL (ref 0.50–1.10)
GFR, Est African American: 126 mL/min/{1.73_m2} (ref >=60)
GFR, Est Non African American: 109 mL/min/{1.73_m2} (ref >=60)
Globulin: 2.5 g/dL (calc) (ref 1.9–3.7)
Glucose, Bld: 107 mg/dL — ABNORMAL HIGH (ref 65–99)
Potassium: 3.8 mmol/L (ref 3.5–5.3)
Sodium: 137 mmol/L (ref 135–146)
Total Bilirubin: 0.5 mg/dL (ref 0.2–1.2)
Total Protein: 7.2 g/dL (ref 6.1–8.1)

## 2019-11-15 LAB — HEMOGLOBIN A1C
Hgb A1c MFr Bld: 6.3 % of total Hgb — ABNORMAL HIGH (ref <5.7)
Mean Plasma Glucose: 134 (calc)
eAG (mmol/L): 7.4 (calc)

## 2019-11-17 ENCOUNTER — Other Ambulatory Visit: Payer: Self-pay

## 2019-11-17 ENCOUNTER — Encounter: Payer: Self-pay | Admitting: Family Medicine

## 2019-11-17 ENCOUNTER — Ambulatory Visit (INDEPENDENT_AMBULATORY_CARE_PROVIDER_SITE_OTHER): Payer: BC Managed Care – PPO | Admitting: Family Medicine

## 2019-11-17 VITALS — BP 126/78 | HR 89 | Temp 98.9°F | Resp 14 | Ht 64.0 in | Wt 161.6 lb

## 2019-11-17 DIAGNOSIS — Z1231 Encounter for screening mammogram for malignant neoplasm of breast: Secondary | ICD-10-CM

## 2019-11-17 DIAGNOSIS — Z0000 Encounter for general adult medical examination without abnormal findings: Secondary | ICD-10-CM

## 2019-11-17 DIAGNOSIS — E782 Mixed hyperlipidemia: Secondary | ICD-10-CM

## 2019-11-17 DIAGNOSIS — R7303 Prediabetes: Secondary | ICD-10-CM | POA: Diagnosis not present

## 2019-11-17 DIAGNOSIS — I10 Essential (primary) hypertension: Secondary | ICD-10-CM

## 2019-11-17 DIAGNOSIS — G4700 Insomnia, unspecified: Secondary | ICD-10-CM

## 2019-11-17 DIAGNOSIS — K219 Gastro-esophageal reflux disease without esophagitis: Secondary | ICD-10-CM

## 2019-11-17 DIAGNOSIS — J309 Allergic rhinitis, unspecified: Secondary | ICD-10-CM

## 2019-11-17 MED ORDER — TRAZODONE HCL 50 MG PO TABS: 25.0000 mg | ORAL_TABLET | Freq: Every evening | ORAL | 3 refills | Status: DC | PRN

## 2019-11-17 NOTE — Progress Notes (Addendum)
Patient: Carrie Cross, Female    DOB: 1976/03/27, 44 y.o.   MRN: [id number] Delsa Grana, PA-C Visit Date: 11/17/2019  Today's Provider: Delsa Grana, PA-C   Chief Complaint  Patient presents with  . Annual Exam    see's gyn   Subjective:   Annual physical exam:  Carrie Cross is a 44 y.o. female who presents today for complete physical exam:  Labs previously completed they were printed today and reviewed with the patient at length, very minimal elevation of blood glucose, A1c elevated to 6.3 slightly increasing from her labs last year  Recently had neck/back surgery to replace an artificial disc, was a month ago, shes recovered well, she has improving right arm numbness/tingling   Exercise/Activity:   shes doing home exercises for neck and arms Diet/nutrition:  No particular diet efforts or changes, eating out more after surgery Sleep:  Poor quality sleep, easy to fall asleep, but cannot stay asleep, usually goes to bed around 10:30 pm up several times for 30-60 min at 12:30, 1:30 and again between 3 and 4 and up at 6 am, she has tried over-the-counter medications, melatonin, insomnia has been ongoing for a long time.  She notes that there is nothing that is causing her to wake up no snoring or loud noises, she tries to have a fairly good bedtime routine.  She wakes up frequently and just cannot get back to sleep this has been happening for many years  HTN: Currently managed on Norvasc 10 mg and metoprolol 100 mg daily Hypertension was uncontrolled about 6 months ago and with the addition of Norvasc to metoprolol she has been well controlled over the past 5 months or so Pt reports good med compliance and denies any SE.  No lightheadedness, hypotension, syncope. Blood pressure today is well controlled. BP Readings from Last 3 Encounters:  11/17/19 126/78  06/13/19 132/74  05/13/19 (!) 164/92   Pt denies CP, SOB, exertional sx, LE edema, palpitation, Ha's, visual  disturbances  Hyperlipidemia: Current Medication Regimen: Atorvastatin 20 mg daily at bedtime she is compliant with this has no side effects or concerns Last Lipids: Lab Results  Component Value Date   CHOL 139 11/14/2019   HDL 59 11/14/2019   LDLCALC 66 11/14/2019   TRIG 48 11/14/2019   CHOLHDL 2.4 11/14/2019  Reviewed her labs today cholesterol panel is well controlled - Denies: Chest pain, shortness of breath, myalgias.  Allergic rhinitis -somewhat improved with over-the-counter medications Allegra and Flonase   USPSTF grade A and B recommendations - reviewed and addressed today  Depression:  Phq 9 completed today by patient, was reviewed by me with patient in the room PHQ score is neg, pt feels good PHQ 2/9 Scores 11/17/2019 06/13/2019 05/13/2019 04/25/2019  PHQ - 2 Score 0 0 0 0  PHQ- 9 Score 0 0 0 0   Depression screen [name] 2/9 11/17/2019 06/13/2019 05/13/2019 04/25/2019 02/08/2018  Decreased Interest 0 0 0 0 0  Down, Depressed, Hopeless 0 0 0 0 0  PHQ - 2 Score 0 0 0 0 0  Altered sleeping 0 0 0 0 -  Tired, decreased energy 0 0 0 0 -  Change in appetite 0 0 0 0 -  Feeling bad or failure about yourself  0 0 0 0 -  Trouble concentrating 0 0 0 0 -  Moving slowly or fidgety/restless 0 0 0 0 -  Suicidal thoughts 0 0 0 0 -  PHQ-9 Score 0 0 0 0 -  Difficult doing work/chores Not difficult at all Not difficult at all Not difficult at all Not difficult at all -    Alcohol screening:   Office Visit from 11/17/2019 in [facility]  AUDIT-C Score  1      Immunizations and Health Maintenance: Health Maintenance  Topic Date Due  . MAMMOGRAM  03/09/2020  . TETANUS/TDAP  07/01/2020  . PAP SMEAR-Modifier  09/10/2020  . INFLUENZA VACCINE  Completed  . HIV Screening  Addressed     STD testing and prevention (HIV/chl/gon/syphilis):  see above, no additional testing desired by pt today  Intimate partner violence:  safe  Sexual History/Pain during Intercourse:  Married  Menstrual History/LMP/Abnormal Bleeding:   IUD - no abnormal bleeding  No LMP recorded. (Menstrual status: IUD).  Incontinence Symptoms: none  Breast cancer:  Last Mammogram: see HM list above - Due 03/2020 - will order today, last was normal  BRCA gene screening: none  Cervical cancer screening: UTD per GYN Dani Gobble Pt denies family hx of cancers - breast, ovarian, uterine, colon:     Osteoporosis:   Discussion on osteoporosis per age, including high calcium and vitamin D supplementation, weight bearing exercises  Skin cancer:  Hx of skin CA -  NO Discussed atypical lesions   Colorectal cancer:   Colonoscopy is not due   Discussed concerning signs and sx of CRC, pt denies melena, hematochezia, change in bowel caliber  Lung cancer:   Low Dose CT Chest recommended if Age 99-80 years, 30 pack-year currently smoking OR have quit w/in 15years. Patient does not qualify.    Social History   Tobacco Use  . Smoking status: Former Smoker    Packs/day: 0.50    Years: 7.00    Pack years: 3.50    Types: Cigarettes    Quit date: 06/02/2011    Years since quitting: 8.4  . Smokeless tobacco: Never Used  Substance Use Topics  . Alcohol use: Yes    Alcohol/week: 3.0 standard drinks    Types: 3 Glasses of wine per week  . Drug use: No       Office Visit from 11/17/2019 in [facility]  AUDIT-C Score  1      Family History  Problem Relation Age of Onset  . Breast cancer Maternal Grandmother 80  . Heart disease Maternal Grandfather   . Stroke Neg Hx   . Diabetes Neg Hx   . Ovarian cancer Neg Hx   . Colon cancer Neg Hx      Blood pressure/Hypertension: BP Readings from Last 3 Encounters:  11/17/19 126/78  06/13/19 132/74  05/13/19 (!) 164/92    Weight/Obesity: Wt Readings from Last 3 Encounters:  11/17/19 161 lb 9.6 oz (73.3 kg)  06/13/19 161 lb 12.8 oz (73.4 kg)  05/13/19 164 lb 14.4 oz (74.8 kg)   BMI Readings from Last 3  Encounters:  11/17/19 27.74 kg/m  06/13/19 27.77 kg/m  05/13/19 30.16 kg/m     Lipids:  Lab Results  Component Value Date   CHOL 139 11/14/2019   CHOL 153 04/26/2019   CHOL 140 02/10/2018   Lab Results  Component Value Date   HDL 59 11/14/2019   HDL 61 04/26/2019   HDL 60 02/10/2018   Lab Results  Component Value Date   LDLCALC 66 11/14/2019   LDLCALC 77 04/26/2019   LDLCALC 66 02/10/2018   Lab Results  Component Value Date   TRIG 48 11/14/2019   TRIG 69 04/26/2019  TRIG 48 02/10/2018   Lab Results  Component Value Date   CHOLHDL 2.4 11/14/2019   CHOLHDL 2.5 04/26/2019   CHOLHDL 2.3 02/10/2018   No results found for: LDLDIRECT Based on the results of lipid panel his/her cardiovascular risk factor ( using [facility] )  in the next 10 years is: The 10-year ASCVD risk score Mikey Bussing DC Brooke Bonito., et al., 2013) is: 1.1%   Values used to calculate the score:     Age: 65 years     Sex: Female     Is Non-Hispanic African American: Yes     Diabetic: No     Tobacco smoker: No     Systolic Blood Pressure: 099 mmHg     Is BP treated: Yes     HDL Cholesterol: 59 mg/dL     Total Cholesterol: 139 mg/dL Glucose:  Glucose, Bld  Date Value Ref Range Status  11/14/2019 107 (H) 65 - 99 mg/dL Final    Comment:    .            Fasting reference interval . For someone without known diabetes, a glucose value between 100 and 125 mg/dL is consistent with prediabetes and should be confirmed with a follow-up test. .   04/26/2019 113 (H) 65 - 99 mg/dL Final    Comment:    .            Fasting reference interval . For someone without known diabetes, a glucose value between 100 and 125 mg/dL is consistent with prediabetes and should be confirmed with a follow-up test. .   02/10/2018 101 (H) 65 - 99 mg/dL Final    Comment:    .            Fasting reference interval . For someone without known diabetes, a glucose value between 100 and 125 mg/dL is consistent  with prediabetes and should be confirmed with a follow-up test. .    Hypertension: BP Readings from Last 3 Encounters:  11/17/19 126/78  06/13/19 132/74  05/13/19 (!) 164/92   Obesity: Wt Readings from Last 3 Encounters:  11/17/19 161 lb 9.6 oz (73.3 kg)  06/13/19 161 lb 12.8 oz (73.4 kg)  05/13/19 164 lb 14.4 oz (74.8 kg)   BMI Readings from Last 3 Encounters:  11/17/19 27.74 kg/m  06/13/19 27.77 kg/m  05/13/19 30.16 kg/m      Advanced Care Planning:  A voluntary discussion about advance care planning including the explanation and discussion of advance directives.   Discussed health care proxy and Living will, and the patient was able to identify a health care proxy as Husband, Rhaya Coale.   Patient does not have a living will at present time.   Social History      She        Social History   Socioeconomic History  . Marital status: Married    Spouse name: Not on file  . Number of children: Not on file  . Years of education: Not on file  . Highest education level: Not on file  Occupational History  . Not on file  Tobacco Use  . Smoking status: Former Smoker    Packs/day: 0.50    Years: 7.00    Pack years: 3.50    Types: Cigarettes    Quit date: 06/02/2011    Years since quitting: 8.4  . Smokeless tobacco: Never Used  Substance and Sexual Activity  . Alcohol use: Yes    Alcohol/week: 3.0 standard drinks  Types: 3 Glasses of wine per week  . Drug use: No  . Sexual activity: Yes    Birth control/protection: I.U.D.    Comment: mirena  Other Topics Concern  . Not on file  Social History Narrative  . Not on file   Social Determinants of Health   Financial Resource Strain:   . Difficulty of Paying Living Expenses:   Food Insecurity:   . Worried About Charity fundraiser in the Last Year:   . Arboriculturist in the Last Year:   Transportation Needs:   . Film/video editor (Medical):   Marland Kitchen Lack of Transportation (Non-Medical):   Physical  Activity:   . Days of Exercise per Week:   . Minutes of Exercise per Session:   Stress:   . Feeling of Stress :   Social Connections:   . Frequency of Communication with Friends and Family:   . Frequency of Social Gatherings with Friends and Family:   . Attends Religious Services:   . Active Member of Clubs or Organizations:   . Attends Archivist Meetings:   Marland Kitchen Marital Status:     Family History        Family History  Problem Relation Age of Onset  . Breast cancer Maternal Grandmother 80  . Heart disease Maternal Grandfather   . Stroke Neg Hx   . Diabetes Neg Hx   . Ovarian cancer Neg Hx   . Colon cancer Neg Hx     Patient Active Problem List   Diagnosis Date Noted  . Prediabetes 04/25/2019  . Class 1 obesity with serious comorbidity and body mass index (BMI) of 30.0 to 30.9 in adult 04/25/2019  . Herpes simplex 08/10/2015  . Eczema 05/11/2015  . Hypertension   . Hyperlipidemia   . GERD (gastroesophageal reflux disease)   . Allergic rhinitis   . Chronic migraine     Past Surgical History:  Procedure Laterality Date  . BACK SURGERY  [id number]   disc shaved down  . CERVICAL DISC ARTHROPLASTY  [id number]     Current Outpatient Medications:  .  amLODipine (NORVASC) 10 MG tablet, Take 1 tablet (10 mg total) by mouth daily., Disp: 90 tablet, Rfl: 3 .  atorvastatin (LIPITOR) 20 MG tablet, TAKE 1 TABLET AT BEDTIME, Disp: 90 tablet, Rfl: 3 .  clobetasol ointment (TEMOVATE) 5.95 %, Apply 1 application topically 2 (two) times daily. Apply to affected area, Disp: 30 g, Rfl: 2 .  fexofenadine (ALLEGRA) 180 MG tablet, TAKE 1 TABLET BY MOUTH EVERY DAY FOR ALLERGIES OR SINUS, Disp: 90 tablet, Rfl: 0 .  fluticasone (FLONASE) 50 MCG/ACT nasal spray, USE 2 SPRAYS IN EACH       NOSTRIL DAILY., Disp: 48 g, Rfl: 1 .  levonorgestrel (MIRENA) 20 MCG/24HR IUD, by Intrauterine route. Reported on 12/13/2015, Disp: , Rfl:  .  metoprolol succinate (TOPROL-XL) 100 MG 24 hr tablet, TAKE  1 TABLET DAILY WITH ORIMMEDIATELY FOLLOWING A    MEAL, Disp: 90 tablet, Rfl: 0 .  pantoprazole (PROTONIX) 20 MG tablet, TAKE 1 TABLET DAILY, Disp: 30 tablet, Rfl: 0 .  famotidine (PEPCID) 40 MG tablet, Take 1 tablet (40 mg total) by mouth daily., Disp: 90 tablet, Rfl: 3 .  ipratropium (ATROVENT) 0.03 % nasal spray, Place 2 sprays into both nostrils 3 (three) times daily., Disp: 30 mL, Rfl: 12  No Known Allergies  Patient Care Team: Delsa Grana, PA-C as PCP - General (Family Medicine)  Review of Systems  Constitutional: Negative.  Negative for activity change, appetite change, fatigue and unexpected weight change.  HENT: Negative.   Eyes: Negative.   Respiratory: Negative.  Negative for shortness of breath.   Cardiovascular: Negative.  Negative for chest pain, palpitations and leg swelling.  Gastrointestinal: Negative.  Negative for abdominal pain and blood in stool.  Endocrine: Negative.   Genitourinary: Negative.   Musculoskeletal: Negative.  Negative for arthralgias, gait problem, joint swelling and myalgias.  Skin: Negative.  Negative for color change, pallor and rash.  Allergic/Immunologic: Negative.   Neurological: Negative.  Negative for syncope and weakness.  Hematological: Negative.   Psychiatric/Behavioral: Negative.  Negative for confusion, dysphoric mood, self-injury and suicidal ideas. The patient is not nervous/anxious.   All other systems reviewed and are negative.     I personally reviewed active problem list, medication list, allergies, family history, social history, health maintenance, notes from last encounter, lab results, imaging with the patient/caregiver today.        Objective:   Vitals:  Vitals:   11/17/19 1123  BP: 126/78  Pulse: 89  Resp: 14  Temp: 98.9 F (37.2 C)  SpO2: 99%  Weight: 161 lb 9.6 oz (73.3 kg)  Height: 5' 4"  (1.626 m)    Body mass index is 27.74 kg/m.  Physical Exam Vitals and nursing note reviewed.  Constitutional:       General: She is not in acute distress.    Appearance: Normal appearance. She is well-developed. She is not ill-appearing, toxic-appearing or diaphoretic.     Interventions: Face mask in place.  HENT:     Head: Normocephalic and atraumatic.     Right Ear: External ear normal.     Left Ear: External ear normal.     Mouth/Throat:     Mouth: Mucous membranes are moist.     Pharynx: Oropharynx is clear.  Eyes:     General: Lids are normal. No scleral icterus.       Right eye: No discharge.        Left eye: No discharge.     Conjunctiva/sclera: Conjunctivae normal.     Pupils: Pupils are equal, round, and reactive to light.  Neck:     Thyroid: No thyroid mass, thyromegaly or thyroid tenderness.     Trachea: Trachea and phonation normal. No tracheal deviation.     Comments: Right mid neck with incision well healed with minimal edema, no tenderness or erythema Cardiovascular:     Rate and Rhythm: Normal rate and regular rhythm.     Pulses: Normal pulses.          Radial pulses are 2+ on the right side and 2+ on the left side.       Posterior tibial pulses are 2+ on the right side and 2+ on the left side.     Heart sounds: Normal heart sounds. No murmur. No friction rub. No gallop.   Pulmonary:     Effort: Pulmonary effort is normal. No respiratory distress.     Breath sounds: Normal breath sounds. No stridor. No wheezing, rhonchi or rales.  Chest:     Chest wall: No tenderness.     Comments: Refused breast exam Abdominal:     General: Bowel sounds are normal. There is no distension.     Palpations: Abdomen is soft.     Tenderness: There is no abdominal tenderness. There is no guarding or rebound.  Genitourinary:    Comments: Refuses GU exam Musculoskeletal:  General: No deformity. Normal range of motion.     Cervical back: Neck supple. No rigidity.     Right lower leg: No edema.     Left lower leg: No edema.  Lymphadenopathy:     Cervical: No cervical adenopathy.  Skin:     General: Skin is warm and dry.     Capillary Refill: Capillary refill takes less than 2 seconds.     Coloration: Skin is not jaundiced or pale.     Findings: No rash.  Neurological:     Mental Status: She is alert and oriented to person, place, and time.     Motor: No abnormal muscle tone.     Gait: Gait normal.  Psychiatric:        Speech: Speech normal.        Behavior: Behavior normal.       Fall Risk: Fall Risk  11/17/2019 06/13/2019 05/13/2019 04/25/2019 02/08/2018  Falls in the past year? 0 0 0 0 No  Number falls in past yr: 0 0 0 0 -  Injury with Fall? 0 0 0 0 -    Functional Status Survey: Is the patient deaf or have difficulty hearing?: No Does the patient have difficulty seeing, even when wearing glasses/contacts?: No Does the patient have difficulty concentrating, remembering, or making decisions?: No Does the patient have difficulty walking or climbing stairs?: No Does the patient have difficulty dressing or bathing?: No Does the patient have difficulty doing errands alone such as visiting a doctor's office or shopping?: No   Assessment & Plan:    CPE completed today  . USPSTF grade A and B recommendations reviewed with patient; age-appropriate recommendations, preventive care, screening tests, etc discussed and encouraged; healthy living encouraged; see AVS for patient education given to patient  . Discussed importance of 150 minutes of physical activity weekly, AHA exercise recommendations given to pt in AVS/handout  . Discussed importance of healthy diet:  eating lean meats and proteins, avoiding trans fats and saturated fats, avoid simple sugars and excessive carbs in diet, eat 6 servings of fruit/vegetables daily and drink plenty of water and avoid sweet beverages.    . Recommended pt to do annual eye exam and routine dental exams/cleanings  . Depression, alcohol, fall screening completed as documented above and per flowsheets  . Reviewed Health  Maintenance: Health Maintenance  Topic Date Due  . MAMMOGRAM  03/09/2020  . TETANUS/TDAP  07/01/2020  . PAP SMEAR-Modifier  09/10/2020  . INFLUENZA VACCINE  Completed  . HIV Screening  Addressed    . Immunizations: Immunization History  Administered Date(s) Administered  . Influenza,inj,Quad PF,6+ Mos 08/01/2015, 06/05/2017, 05/22/2019  . Influenza-Unspecified 06/05/2017, 06/18/2018, 05/22/2019  . Tdap 07/01/2010   Results for orders placed or performed in visit on 11/14/19  Lipid panel  Result Value Ref Range   Cholesterol 139 <200 mg/dL   HDL 59 > OR = 50 mg/dL   Triglycerides 48 <150 mg/dL   LDL Cholesterol (Calc) 66 mg/dL (calc)   Total CHOL/HDL Ratio 2.4 <5.0 (calc)   Non-HDL Cholesterol (Calc) 80 <130 mg/dL (calc)  COMPLETE METABOLIC PANEL WITH GFR  Result Value Ref Range   Glucose, Bld 107 (H) 65 - 99 mg/dL   BUN 14 7 - 25 mg/dL   Creat 0.65 0.50 - 1.10 mg/dL   GFR, Est Non African American 109 > OR = 60 mL/min/1.76m   GFR, Est African American 126 > OR = 60 mL/min/1.7102m  BUN/Creatinine Ratio NOT APPLICABLE 6 -  22 (calc)   Sodium 137 135 - 146 mmol/L   Potassium 3.8 3.5 - 5.3 mmol/L   Chloride 102 98 - 110 mmol/L   CO2 24 20 - 32 mmol/L   Calcium 9.7 8.6 - 10.2 mg/dL   Total Protein 7.2 6.1 - 8.1 g/dL   Albumin 4.7 3.6 - 5.1 g/dL   Globulin 2.5 1.9 - 3.7 g/dL (calc)   AG Ratio 1.9 1.0 - 2.5 (calc)   Total Bilirubin 0.5 0.2 - 1.2 mg/dL   Alkaline phosphatase (APISO) 77 31 - 125 U/L   AST 12 10 - 30 U/L   ALT 12 6 - 29 U/L  Hemoglobin A1c  Result Value Ref Range   Hgb A1c MFr Bld 6.3 (H) <5.7 % of total Hgb   Mean Plasma Glucose 134 (calc)   eAG (mmol/L) 7.4 (calc)  CBC with Differential/Platelet  Result Value Ref Range   WBC 7.8 3.8 - 10.8 Thousand/uL   RBC 4.02 3.80 - 5.10 Million/uL   Hemoglobin 12.5 11.7 - 15.5 g/dL   HCT 36.7 35.0 - 45.0 %   MCV 91.3 80.0 - 100.0 fL   MCH 31.1 27.0 - 33.0 pg   MCHC 34.1 32.0 - 36.0 g/dL   RDW 12.7 11.0 - 15.0  %   Platelets 406 (H) 140 - 400 Thousand/uL   MPV 9.0 7.5 - 12.5 fL   Neutro Abs 4,641 1,500 - 7,800 cells/uL   Lymphs Abs 2,683 850 - 3,900 cells/uL   Absolute Monocytes 367 200 - 950 cells/uL   Eosinophils Absolute 78 15 - 500 cells/uL   Basophils Absolute 31 0 - 200 cells/uL   Neutrophils Relative % 59.5 %   Total Lymphocyte 34.4 %   Monocytes Relative 4.7 %   Eosinophils Relative 1.0 %   Basophils Relative 0.4 %   All labs printed and reviewed with patient today    ICD-10-CM   1. Adult general medical exam  Z00.00    labs previously ordered, completed and reviewed today for CPE  2. Insomnia, unspecified type  G47.00 traZODone (DESYREL) 50 MG tablet   No trouble falling asleep but multiple waking throughout the night, discussed options and she will try trazodone  3. Prediabetes  R73.03    A1C increasing, last 6.3, discussed diet efforts to hopefully decrease and avoid new onset type 2 DM  4. Gastroesophageal reflux disease, unspecified whether esophagitis present  K21.9    Well-controlled with Protonix  5. Allergic rhinitis, unspecified seasonality, unspecified trigger  J30.9    Improved symptoms with Allegra and Flonase  6. Mixed hyperlipidemia  E78.2    Well-controlled with statin medication she is tolerating without any concerns refills given today  7. Essential hypertension  I10    Blood pressure well controlled with metoprolol and addition of Norvasc, labs reviewed good renal function no side effects of medications  8. Encounter for screening mammogram for malignant neoplasm of breast  Z12.31 MM 3D SCREEN BREAST BILATERAL   Return for 1 month virtual f/up insomnia,   6 month routine f/up HTN, HLD, preDM.    Delsa Grana, PA-C 11/17/19 11:58 AM  [facility]

## 2019-11-17 NOTE — Patient Instructions (Addendum)
Look up advance directives in [place]  - fill out and return  Power of Attorney and Living Will   Preventive Care 19-44 Years Old, Female Preventive care refers to visits with your health care provider and lifestyle choices that can promote health and wellness. This includes:  A yearly physical exam. This may also be called an annual well check.  Regular dental visits and eye exams.  Immunizations.  Screening for certain conditions.  Healthy lifestyle choices, such as eating a healthy diet, getting regular exercise, not using drugs or products that contain nicotine and tobacco, and limiting alcohol use. What can I expect for my preventive care visit? Physical exam Your health care provider will check your:  Height and weight. This may be used to calculate body mass index (BMI), which tells if you are at a healthy weight.  Heart rate and blood pressure.  Skin for abnormal spots. Counseling Your health care provider may ask you questions about your:  Alcohol, tobacco, and drug use.  Emotional well-being.  Home and relationship well-being.  Sexual activity.  Eating habits.  Work and work Statistician.  Method of birth control.  Menstrual cycle.  Pregnancy history. What immunizations do I need?  Influenza (flu) vaccine  This is recommended every year. Tetanus, diphtheria, and pertussis (Tdap) vaccine  You may need a Td booster every 10 years. Varicella (chickenpox) vaccine  You may need this if you have not been vaccinated. Zoster (shingles) vaccine  You may need this after age 50. Measles, mumps, and rubella (MMR) vaccine  You may need at least one dose of MMR if you were born in 1957 or later. You may also need a second dose. Pneumococcal conjugate (PCV13) vaccine  You may need this if you have certain conditions and were not previously vaccinated. Pneumococcal polysaccharide (PPSV23) vaccine  You may need one or two doses if you smoke  cigarettes or if you have certain conditions. Meningococcal conjugate (MenACWY) vaccine  You may need this if you have certain conditions. Hepatitis A vaccine  You may need this if you have certain conditions or if you travel or work in places where you may be exposed to hepatitis A. Hepatitis B vaccine  You may need this if you have certain conditions or if you travel or work in places where you may be exposed to hepatitis B. Haemophilus influenzae type b (Hib) vaccine  You may need this if you have certain conditions. Human papillomavirus (HPV) vaccine  If recommended by your health care provider, you may need three doses over 6 months. You may receive vaccines as individual doses or as more than one vaccine together in one shot (combination vaccines). Talk with your health care provider about the risks and benefits of combination vaccines. What tests do I need? Blood tests  Lipid and cholesterol levels. These may be checked every 5 years, or more frequently if you are over 33 years old.  Hepatitis C test.  Hepatitis B test. Screening  Lung cancer screening. You may have this screening every year starting at age 44 if you have a 30-pack-year history of smoking and currently smoke or have quit within the past 15 years.  Colorectal cancer screening. All adults should have this screening starting at age 45 and continuing until age 54. Your health care provider may recommend screening at age 67 if you are at increased risk. You will have tests every 1-10 years, depending on your results and the type of screening test.  Diabetes screening.  This is done by checking your blood sugar (glucose) after you have not eaten for a while (fasting). You may have this done every 1-3 years.  Mammogram. This may be done every 1-2 years. Talk with your health care provider about when you should start having regular mammograms. This may depend on whether you have a family history of breast  cancer.  BRCA-related cancer screening. This may be done if you have a family history of breast, ovarian, tubal, or peritoneal cancers.  Pelvic exam and Pap test. This may be done every 3 years starting at age 44. Starting at age 44, this may be done every 5 years if you have a Pap test in combination with an HPV test. Other tests  Sexually transmitted disease (STD) testing.  Bone density scan. This is done to screen for osteoporosis. You may have this scan if you are at high risk for osteoporosis. Follow these instructions at home: Eating and drinking  Eat a diet that includes fresh fruits and vegetables, whole grains, lean protein, and low-fat dairy.  Take vitamin and mineral supplements as recommended by your health care provider.  Do not drink alcohol if: ? Your health care provider tells you not to drink. ? You are pregnant, may be pregnant, or are planning to become pregnant.  If you drink alcohol: ? Limit how much you have to 0-1 drink a day. ? Be aware of how much alcohol is in your drink. In the [place] one drink equals one 12 oz bottle of beer (355 mL), one 5 oz glass of wine (148 mL), or one 1 oz glass of hard liquor (44 mL). Lifestyle  Take daily care of your teeth and gums.  Stay active. Exercise for at least 30 minutes on 5 or more days each week.  Do not use any products that contain nicotine or tobacco, such as cigarettes, e-cigarettes, and chewing tobacco. If you need help quitting, ask your health care provider.  If you are sexually active, practice safe sex. Use a condom or other form of birth control (contraception) in order to prevent pregnancy and STIs (sexually transmitted infections).  If told by your health care provider, take low-dose aspirin daily starting at age 44. What's next?  Visit your health care provider once a year for a well check visit.  Ask your health care provider how often you should have your eyes and teeth checked.  Stay up to date  on all vaccines. This information is not intended to replace advice given to you by your health care provider. Make sure you discuss any questions you have with your health care provider. Document Revised: 04/29/2018 Document Reviewed: 04/29/2018 Elsevier Patient Education  2020 [place].    Preventing Osteoporosis, Adult Osteoporosis is a condition that causes the bones to lose density. This means that the bones become thinner, and the normal spaces in bone tissue become larger. Low bone density can make the bones weak and cause them to break more easily. Osteoporosis cannot always be prevented, but you can take steps to lower your risk of developing this condition. How can this condition affect me? If you develop osteoporosis, you will be more likely to break bones in your wrist, spine, or hip. Even a minor accident or injury can be enough to break weak bones. The bones will also be slower to heal. Osteoporosis can cause other problems as well, such as a stooped posture or trouble with movement. Osteoporosis can occur with aging. As you get older,  you may lose bone tissue more quickly, or it may be replaced more slowly. Osteoporosis is more likely to develop if you have poor nutrition or do not get enough calcium or vitamin D. Other lifestyle factors can also play a role. By eating a well-balanced diet and making lifestyle changes, you can help keep your bones strong and healthy, lowering your chances of developing osteoporosis. What can increase my risk? The following factors may make you more likely to develop osteoporosis:  Having a family history of the condition.  Having poor nutrition or not getting enough calcium or vitamin D.  Using certain medicines, such as steroid medicines or antiseizure medicines.  Being any of the following: ? 41 years of age or older. ? Female. ? A woman who has gone through menopause (is postmenopausal). ? White (Caucasian) or of Asian  descent.  Smoking or having a history of smoking.  Not being physically active (being sedentary).  Having a small body frame. What actions can I take to prevent this?  Get enough calcium   Make sure you get enough calcium every day. Calcium is the most important mineral for bone health. Most people can get enough calcium from their diet, but supplements may be recommended for people who are at risk for osteoporosis. Follow these guidelines: ? If you are age 27 or younger, aim to get 1,000 mg of calcium every day. ? If you are older than age 47, aim to get 1,200 mg of calcium every day.  Good sources of calcium include: ? Dairy products, such as low-fat or nonfat milk, cheese, and yogurt. ? Dark green leafy vegetables, such as bok choy and broccoli. ? Foods that have had calcium added to them (calcium-fortified foods), such as orange juice, cereal, bread, soy beverages, and tofu products. ? Nuts, such as almonds.  Check nutrition labels to see how much calcium is in a food or drink. Get enough vitamin D  Try to get enough vitamin D every day. Vitamin D is the most essential vitamin for bone health. It helps the body absorb calcium. Follow these guidelines for how much vitamin D to get from food: ? If you are age 62 or younger, aim to get at least 600 international units (IU) every day. Your health care provider may suggest more. ? If you are older than age 38, aim to get at least 800 international units every day. Your health care provider may suggest more.  Good sources of vitamin D in your diet include: ? Egg yolks. ? Oily fish, such as salmon, sardines, and tuna. ? Milk and cereal fortified with vitamin D.  Your body also makes vitamin D when you are out in the sun. Exposing the bare skin on your face, arms, legs, or back to the sun for no more than 30 minutes a day, 2 times a week is more than enough. Beyond that, make sure you use sunblock to protect your skin from sunburn,  which increases your risk for skin cancer. Exercise  Stay active and get exercise every day.  Ask your health care provider what types of exercise are best for you. Weight-bearing and strength-building activities are important for building and maintaining healthy bones. Some examples of these types of activities include: ? Walking and hiking. ? Jogging and running. ? Dancing. ? Gym exercises. ? Lifting weights. ? Tennis and racquetball. ? Climbing stairs. ? Aerobics. Make other lifestyle changes  Do not use any products that contain nicotine or tobacco, such as  cigarettes, e-cigarettes, and chewing tobacco. If you need help quitting, ask your health care provider.  Lose weight if you are overweight.  If you drink alcohol: ? Limit how much you use to:  0-1 drink a day for nonpregnant women.  0-2 drinks a day for men. ? Be aware of how much alcohol is in your drink. In the [place].S., one drink equals one 12 oz bottle of beer (355 mL), one 5 oz glass of wine (148 mL), or one 1 oz glass of hard liquor (44 mL). Where to find support If you need help making changes to prevent osteoporosis, talk with your health care provider. You can ask for a referral to a diet and nutrition specialist (dietitian) and a physical therapist. Where to find more information Learn more about osteoporosis from:  NIH Osteoporosis and Related [place]: www.bones.SouthExposed.es  [place]S. Office on [employer]: [place].tn  [place]: EquipmentWeekly.com.ee Summary  Osteoporosis is a condition that causes weak bones that are more likely to break.  Eat a healthy diet, making sure you get enough calcium and vitamin D, and stay active by getting regular exercise to help prevent osteoporosis.  Other ways to reduce your risk of osteoporosis include maintaining a healthy weight and avoiding alcohol and products that contain nicotine or tobacco. This information is not  intended to replace advice given to you by your health care provider. Make sure you discuss any questions you have with your health care provider. Document Revised: 03/18/2019 Document Reviewed: 03/18/2019 Elsevier Patient Education  [place].

## 2019-11-18 ENCOUNTER — Encounter: Payer: Self-pay | Admitting: Family Medicine

## 2019-11-18 DIAGNOSIS — G4700 Insomnia, unspecified: Secondary | ICD-10-CM | POA: Insufficient documentation

## 2019-12-08 ENCOUNTER — Other Ambulatory Visit: Payer: Self-pay | Admitting: Family Medicine

## 2019-12-08 DIAGNOSIS — K219 Gastro-esophageal reflux disease without esophagitis: Secondary | ICD-10-CM

## 2019-12-11 ENCOUNTER — Other Ambulatory Visit: Payer: Self-pay | Admitting: Family Medicine

## 2019-12-11 DIAGNOSIS — I10 Essential (primary) hypertension: Secondary | ICD-10-CM

## 2019-12-12 ENCOUNTER — Other Ambulatory Visit: Payer: Self-pay

## 2019-12-12 ENCOUNTER — Encounter: Payer: Self-pay | Admitting: Family Medicine

## 2019-12-12 ENCOUNTER — Ambulatory Visit (INDEPENDENT_AMBULATORY_CARE_PROVIDER_SITE_OTHER): Payer: BC Managed Care – PPO | Admitting: Family Medicine

## 2019-12-12 VITALS — Ht 64.0 in | Wt 161.0 lb

## 2019-12-12 DIAGNOSIS — G4700 Insomnia, unspecified: Secondary | ICD-10-CM | POA: Diagnosis not present

## 2019-12-12 DIAGNOSIS — R29818 Other symptoms and signs involving the nervous system: Secondary | ICD-10-CM | POA: Diagnosis not present

## 2019-12-12 DIAGNOSIS — R0683 Snoring: Secondary | ICD-10-CM | POA: Diagnosis not present

## 2019-12-12 MED ORDER — ZOLPIDEM TARTRATE ER 6.25 MG PO TBCR: 6.2500 mg | EXTENDED_RELEASE_TABLET | Freq: Every evening | ORAL | 0 refills | Status: DC | PRN

## 2019-12-12 NOTE — Progress Notes (Signed)
Name: Carrie Cross   MRN: [id number]    DOB: [birth year]   Date:12/12/2019       Progress Note  Subjective:    Chief Complaint  Chief Complaint  Patient presents with  . Follow-up  . Insomnia    staying asleep, pt states trazadone not really halping    I connected with  [name]  on 12/12/19 at  3:20 PM EDT by a video enabled telemedicine application and verified that I am speaking with the correct person using two identifiers.  I discussed the limitations of evaluation and management by telemedicine and the availability of in person appointments. The patient expressed understanding and agreed to proceed. Staff also discussed with the patient that there may be a patient responsible charge related to this service. [place] Provider Location: [facility] Additional Individuals present: no one on the call - husband in background  HPI  Insomnia f/up after trial of trazodone: At CPE last month - Sleep:  Poor quality sleep, easy to fall asleep, but cannot stay asleep, usually goes to bed around 10:30 pm up several times for 30-60 min at 12:30, 1:30 and again between 3 and 4 and up at 6 am, she has tried over-the-counter medications, melatonin, insomnia has been ongoing for a long time.  She notes that there is nothing that is causing her to wake up no snoring or loud noises, she tries to have a fairly good bedtime routine.  She wakes up frequently and just cannot get back to sleep this has been happening for many years She is still very tired, "low energy"   No improvement with 25-50 mg trazodone  Pt asks her husband if she snores and he was emphatic exclaiming that she does - so much so that she wakes herself up.  Pt previously denied this, no sleep study done in the past. She continues to have nighttime waking, difficulty getting back to sleep, does not feel well rested, tired/fatigued and "low energy"     Patient Active  Problem List   Diagnosis Date Noted  . Insomnia 11/18/2019  . Prediabetes 04/25/2019  . Class 1 obesity with serious comorbidity and body mass index (BMI) of 30.0 to 30.9 in adult 04/25/2019  . Herpes simplex 08/10/2015  . Eczema 05/11/2015  . Hypertension   . HLD (hyperlipidemia)   . GERD (gastroesophageal reflux disease)   . Allergic rhinitis   . Chronic migraine     Social History   Tobacco Use  . Smoking status: Former Smoker    Packs/day: 0.50    Years: 7.00    Pack years: 3.50    Types: Cigarettes    Quit date: 06/02/2011    Years since quitting: 8.5  . Smokeless tobacco: Never Used  Substance Use Topics  . Alcohol use: Yes    Alcohol/week: 3.0 standard drinks    Types: 3 Glasses of wine per week     Current Outpatient Medications:  .  amLODipine (NORVASC) 10 MG tablet, Take 1 tablet (10 mg total) by mouth daily., Disp: 90 tablet, Rfl: 3 .  atorvastatin (LIPITOR) 20 MG tablet, TAKE 1 TABLET AT BEDTIME, Disp: 90 tablet, Rfl: 3 .  clobetasol ointment (TEMOVATE) [id number] %, Apply 1 application topically 2 (two) times daily. Apply to affected area, Disp: 30 g, Rfl: 2 .  fexofenadine (ALLEGRA) 180 MG tablet, TAKE 1 TABLET BY MOUTH EVERY DAY FOR ALLERGIES OR SINUS, Disp: 90 tablet, Rfl:  0 .  fluticasone (FLONASE) 50 MCG/ACT nasal spray, USE 2 SPRAYS IN EACH       NOSTRIL DAILY., Disp: 48 g, Rfl: 1 .  ipratropium (ATROVENT) 0.03 % nasal spray, Place 2 sprays into both nostrils 3 (three) times daily., Disp: 30 mL, Rfl: 12 .  levonorgestrel (MIRENA) 20 MCG/24HR IUD, by Intrauterine route. Reported on 12/13/2015, Disp: , Rfl:  .  metoprolol succinate (TOPROL-XL) 100 MG 24 hr tablet, TAKE 1 TABLET DAILY WITH ORIMMEDIATELY FOLLOWING A    MEAL, Disp: 90 tablet, Rfl: 0 .  pantoprazole (PROTONIX) 20 MG tablet, TAKE 1 TABLET DAILY, Disp: 30 tablet, Rfl: 0 .  traZODone (DESYREL) 50 MG tablet, Take 0.5-1 tablets (25-50 mg total) by mouth at bedtime as needed for sleep., Disp: 30 tablet, Rfl:  3  No Known Allergies  I personally reviewed active problem list, medication list, allergies, family history, social history, health maintenance, notes from last encounter, lab results, imaging with the patient/caregiver today.   Review of Systems  10 Systems reviewed and are negative for acute change except as noted in the HPI.   Objective:   Virtual encounter, vitals limited, only able to obtain the following Today's Vitals   12/12/19 1454  Weight: 161 lb (73 kg)  Height: 5\' 4"  (1.626 m)   Body mass index is 27.64 kg/m. Nursing Note and Vital Signs reviewed.  Physical Exam Alert, phonation clear PE limited by telephone encounter  No results found for this or any previous visit (from the past 72 hour(s)).  Assessment and Plan:     ICD-10-CM   1. Insomnia, unspecified type  G47.00 Ambulatory referral to Neurology    zolpidem (AMBIEN CR) 6.25 MG CR tablet  2. Suspected sleep apnea  R29.818 Ambulatory referral to Neurology    Continued insomnia Plan: Continue sleep hygiene efforts Try increasing trazodone dose to 100 mg? Possibly prn use of extended release sleep rx like lower dose ambien cr - discussed sedation, dependence etc -  Discussed preferred tx with therapy/psych for insomnia - as a condition of meds, and would only give limited amount of ambien (or other controlled substance sleep meds) a month to avoid daily use/dependence/rebound Pt's husband today reported severe loud snoring that wakes her up multiple times a night - will refer to specialist for sleep studies - pt was willing to do this after a discussion about sleep studies and the process to assess OSA     -Red flags and when to present for emergency care or RTC including fever >101.68F, chest pain, shortness of breath, new/worsening/un-resolving symptoms, reviewed with patient at time of visit. Follow up and care instructions discussed and provided in AVS. - I discussed the assessment and treatment plan  with the patient. The patient was provided an opportunity to ask questions and all were answered. The patient agreed with the plan and demonstrated an understanding of the instructions.  I provided 20+ minutes of non-face-to-face time during this encounter.  Delsa Grana, PA-C 12/12/19 3:31 PM

## 2019-12-30 ENCOUNTER — Encounter: Payer: BC Managed Care – PPO | Admitting: Certified Nurse Midwife

## 2020-01-07 ENCOUNTER — Other Ambulatory Visit: Payer: Self-pay | Admitting: Family Medicine

## 2020-01-07 DIAGNOSIS — K219 Gastro-esophageal reflux disease without esophagitis: Secondary | ICD-10-CM

## 2020-01-08 DIAGNOSIS — G479 Sleep disorder, unspecified: Secondary | ICD-10-CM | POA: Insufficient documentation

## 2020-01-27 ENCOUNTER — Encounter: Payer: Self-pay | Admitting: Certified Nurse Midwife

## 2020-01-27 ENCOUNTER — Other Ambulatory Visit: Payer: Self-pay

## 2020-01-27 ENCOUNTER — Ambulatory Visit (INDEPENDENT_AMBULATORY_CARE_PROVIDER_SITE_OTHER): Payer: BC Managed Care – PPO | Admitting: Certified Nurse Midwife

## 2020-01-27 VITALS — BP 137/79 | HR 85 | Ht 64.0 in | Wt 161.8 lb

## 2020-01-27 DIAGNOSIS — Z975 Presence of (intrauterine) contraceptive device: Secondary | ICD-10-CM | POA: Diagnosis not present

## 2020-01-27 DIAGNOSIS — Z01419 Encounter for gynecological examination (general) (routine) without abnormal findings: Secondary | ICD-10-CM

## 2020-01-27 NOTE — Progress Notes (Signed)
Pt present for annual exam. Pt stated that she is doing well no problems. Pt already has an order place for a mammogram from her PCP. Pt is aware that the order has been place and she is due for a mammogram.

## 2020-01-27 NOTE — Progress Notes (Signed)
ANNUAL PREVENTATIVE CARE GYN  ENCOUNTER NOTE  Subjective:       Carrie Cross is a 44 y.o. [id number] female here for a routine annual gynecologic exam.    Doing well, no concerns or complaints.  Denies difficulty breathing or respiratory distress, chest pain, abdominal pain, excessive vaginal bleeding, dysuria, leg pain or swelling  Gynecologic History  No LMP recorded (lmp unknown). (Menstrual status: IUD).   Contraception: IUD, Mirena   Last Pap: 09/10/17. Results were: Neg/Neg  Last mammogram: 03/10/19. Results were: BI-Raids 1  Obstetric History  OB History  Gravida Para Term Preterm AB Living  1 1   1   1   SAB TAB Ectopic Multiple Live Births          1    # Outcome Date GA Lbr Len/2nd Weight Sex Delivery Anes PTL Lv  1 Preterm 12/06/98   3 lb (1.361 kg) F Vag-Spont   LIV    Past Medical History:  Diagnosis Date  . Allergic rhinitis   . Carpal tunnel syndrome   . Chronic migraine   . GERD (gastroesophageal reflux disease)   . Hx of pyelonephritis    as a child  . Hyperlipidemia   . Hypertension    Controlled per pt  . IFG (impaired fasting glucose)   . Irregular menstruation     Past Surgical History:  Procedure Laterality Date  . BACK SURGERY  [id number]   disc shaved down  . CERVICAL DISC ARTHROPLASTY  [id number]    Current Outpatient Medications on File Prior to Visit  Medication Sig Dispense Refill  . amLODipine (NORVASC) 10 MG tablet Take 1 tablet (10 mg total) by mouth daily. 90 tablet 3  . atorvastatin (LIPITOR) 20 MG tablet TAKE 1 TABLET AT BEDTIME 90 tablet 3  . clobetasol ointment (TEMOVATE) [id number] % Apply 1 application topically 2 (two) times daily. Apply to affected area 30 g 2  . fexofenadine (ALLEGRA) 180 MG tablet TAKE 1 TABLET BY MOUTH EVERY DAY FOR ALLERGIES OR SINUS 90 tablet 0  . fluticasone (FLONASE) 50 MCG/ACT nasal spray USE 2 SPRAYS IN EACH       NOSTRIL DAILY. 48 g 1  . levonorgestrel (MIRENA) 20 MCG/24HR IUD by Intrauterine route.  Reported on 12/13/2015    . metoprolol succinate (TOPROL-XL) 100 MG 24 hr tablet TAKE 1 TABLET DAILY WITH ORIMMEDIATELY FOLLOWING A    MEAL 90 tablet 0  . pantoprazole (PROTONIX) 20 MG tablet TAKE 1 TABLET DAILY 90 tablet 3  . ipratropium (ATROVENT) 0.03 % nasal spray Place 2 sprays into both nostrils 3 (three) times daily. (Patient not taking: Reported on 01/27/2020) 30 mL 12  . traZODone (DESYREL) 50 MG tablet Take 0.5-1 tablets (25-50 mg total) by mouth at bedtime as needed for sleep. (Patient not taking: Reported on 01/27/2020) 30 tablet 3  . zolpidem (AMBIEN CR) 6.25 MG CR tablet Take 1 tablet (6.25 mg total) by mouth at bedtime as needed for sleep. (Patient not taking: Reported on 01/27/2020) 10 tablet 0  . [DISCONTINUED] atorvastatin (LIPITOR) 20 MG tablet TAKE 1 TABLET AT BEDTIME 30 tablet 0   No current facility-administered medications on file prior to visit.    No Known Allergies  Social History   Socioeconomic History  . Marital status: Married    Spouse name: Not on file  . Number of children: Not on file  . Years of education: Not on file  . Highest education level: Not on file  Occupational History  .  Not on file  Tobacco Use  . Smoking status: Former Smoker    Packs/day: 0.50    Years: 7.00    Pack years: 3.50    Types: Cigarettes    Quit date: 06/02/2011    Years since quitting: 8.6  . Smokeless tobacco: Never Used  Substance and Sexual Activity  . Alcohol use: Yes    Alcohol/week: 3.0 standard drinks    Types: 3 Glasses of wine per week  . Drug use: No  . Sexual activity: Yes    Birth control/protection: I.U.D.    Comment: mirena  Other Topics Concern  . Not on file  Social History Narrative  . Not on file   Social Determinants of Health   Financial Resource Strain:   . Difficulty of Paying Living Expenses:   Food Insecurity:   . Worried About Charity fundraiser in the Last Year:   . Arboriculturist in the Last Year:   Transportation Needs:   . Consulting civil engineer (Medical):   Marland Kitchen Lack of Transportation (Non-Medical):   Physical Activity:   . Days of Exercise per Week:   . Minutes of Exercise per Session:   Stress:   . Feeling of Stress :   Social Connections:   . Frequency of Communication with Friends and Family:   . Frequency of Social Gatherings with Friends and Family:   . Attends Religious Services:   . Active Member of Clubs or Organizations:   . Attends Archivist Meetings:   Marland Kitchen Marital Status:   Intimate Partner Violence:   . Fear of Current or Ex-Partner:   . Emotionally Abused:   Marland Kitchen Physically Abused:   . Sexually Abused:     Family History  Problem Relation Age of Onset  . Breast cancer Maternal Grandmother 80  . Heart disease Maternal Grandfather   . Stroke Neg Hx   . Diabetes Neg Hx   . Ovarian cancer Neg Hx   . Colon cancer Neg Hx     The following portions of the patient's history were reviewed and updated as appropriate: allergies, current medications, past family history, past medical history, past social history, past surgical history and problem list.  Review of Systems  ROS- negative except as noted above. Information obtained from patient.   Objective:   BP 137/79   Pulse 85   Ht 5\' 4"  (1.626 m)   Wt 161 lb 12.8 oz (73.4 kg)   LMP  (LMP Unknown)   BMI 27.77 kg/m    CONSTITUTIONAL: Well-developed, well-nourished female in no acute distress.   PSYCHIATRIC: Normal mood and affect. Normal behavior. Normal judgment and thought content.  [place]: Alert and oriented to person, place, and time. Normal muscle tone coordination. No cranial nerve deficit noted.  HENT:  Normocephalic, atraumatic, External right and left ear normal. Oropharynx is clear and moist  EYES: Conjunctivae and EOM are normal. No  scleral icterus.   NECK: Normal range of motion, supple, no masses.  Normal thyroid.   SKIN: Skin is warm and dry. No rash noted. Not diaphoretic. No erythema. No  pallor.  CARDIOVASCULAR: Normal heart rate noted, regular rhythm, no murmur.  RESPIRATORY: Clear to auscultation bilaterally. Effort and breath sounds normal, no problems with respiration noted.  BREASTS: Symmetric in size. No masses, skin changes, nipple drainage, or lymphadenopathy.  ABDOMEN: Soft, normal bowel sounds, no distention noted.  No tenderness, rebound or guarding.   PELVIC:  External Genitalia: Normal  Vagina: Normal  Cervix: Normal, IUD in place  Uterus: Normal  Adnexa: Normal   MUSCULOSKELETAL: Normal range of motion. No tenderness.  No cyanosis, clubbing, or edema.  2+ distal pulses.  LYMPHATIC: No Axillary, Supraclavicular, or Inguinal Adenopathy.  Assessment:   Annual gynecologic examination 44 y.o.   Contraception: IUD   Overweight   Problem List Items Addressed This Visit    None    Visit Diagnoses    Encounter for well woman exam with routine gynecological exam    -  Primary   IUD (intrauterine device) in place          Plan:  Pap: Not needed   Mammogram: Previously ordered by PCP  Routine preventative health maintenance measures emphasized: Exercise/Diet/Weight control, Tobacco Warnings, Alcohol/Substance use risks and Stress Management. See AVS.   Reviewed red flags and when to call the office.  RTC x 1 year for ANNUAL EXAM or sooner if needed.   Fransico Him RN [place] 01/27/20 5:15 PM

## 2020-01-27 NOTE — Progress Notes (Signed)
I have seen, interviewed, and examined the patient in conjunction with the [facility] Nurse Practitioner student and affirm the diagnosis and management plan.   Diona Fanti, CNM [facility], [facility] 01/27/20 5:36 PM

## 2020-01-27 NOTE — Patient Instructions (Addendum)
Levonorgestrel intrauterine device (IUD) What is this medicine? LEVONORGESTREL IUD (LEE voe nor jes trel) is a contraceptive (birth control) device. The device is placed inside the uterus by a healthcare professional. It is used to prevent pregnancy. This device can also be used to treat heavy bleeding that occurs during your period. This medicine may be used for other purposes; ask your health care provider or pharmacist if you have questions. COMMON BRAND NAME(S): Minette Headland What should I tell my health care provider before I take this medicine? They need to know if you have any of these conditions:  abnormal Pap smear  cancer of the breast, uterus, or cervix  diabetes  endometritis  genital or pelvic infection now or in the past  have more than one sexual partner or your partner has more than one partner  heart disease  history of an ectopic or tubal pregnancy  immune system problems  IUD in place  liver disease or tumor  problems with blood clots or take blood-thinners  seizures  use intravenous drugs  uterus of unusual shape  vaginal bleeding that has not been explained  an unusual or allergic reaction to levonorgestrel, other hormones, silicone, or polyethylene, medicines, foods, dyes, or preservatives  pregnant or trying to get pregnant  breast-feeding How should I use this medicine? This device is placed inside the uterus by a health care professional. Talk to your pediatrician regarding the use of this medicine in children. Special care may be needed. Overdosage: If you think you have taken too much of this medicine contact a poison control center or emergency room at once. NOTE: This medicine is only for you. Do not share this medicine with others. What if I miss a dose? This does not apply. Depending on the brand of device you have inserted, the device will need to be replaced every 3 to 6 years if you wish to continue using this type  of birth control. What may interact with this medicine? Do not take this medicine with any of the following medications:  amprenavir  bosentan  fosamprenavir This medicine may also interact with the following medications:  aprepitant  armodafinil  barbiturate medicines for inducing sleep or treating seizures  bexarotene  boceprevir  griseofulvin  medicines to treat seizures like carbamazepine, ethotoin, felbamate, oxcarbazepine, phenytoin, topiramate  modafinil  pioglitazone  rifabutin  rifampin  rifapentine  some medicines to treat HIV infection like atazanavir, efavirenz, indinavir, lopinavir, nelfinavir, tipranavir, ritonavir  St. John's wort  warfarin This list may not describe all possible interactions. Give your health care provider a list of all the medicines, herbs, non-prescription drugs, or dietary supplements you use. Also tell them if you smoke, drink alcohol, or use illegal drugs. Some items may interact with your medicine. What should I watch for while using this medicine? Visit your doctor or health care professional for regular check ups. See your doctor if you or your partner has sexual contact with others, becomes HIV positive, or gets a sexual transmitted disease. This product does not protect you against HIV infection (AIDS) or other sexually transmitted diseases. You can check the placement of the IUD yourself by reaching up to the top of your vagina with clean fingers to feel the threads. Do not pull on the threads. It is a good habit to check placement after each menstrual period. Call your doctor right away if you feel more of the IUD than just the threads or if you cannot feel the threads at  all. The IUD may come out by itself. You may become pregnant if the device comes out. If you notice that the IUD has come out use a backup birth control method like condoms and call your health care provider. Using tampons will not change the position of the  IUD and are okay to use during your period. This IUD can be safely scanned with magnetic resonance imaging (MRI) only under specific conditions. Before you have an MRI, tell your healthcare provider that you have an IUD in place, and which type of IUD you have in place. What side effects may I notice from receiving this medicine? Side effects that you should report to your doctor or health care professional as soon as possible:  allergic reactions like skin rash, itching or hives, swelling of the face, lips, or tongue  fever, flu-like symptoms  genital sores  high blood pressure  no menstrual period for 6 weeks during use  pain, swelling, warmth in the leg  pelvic pain or tenderness  severe or sudden headache  signs of pregnancy  stomach cramping  sudden shortness of breath  trouble with balance, talking, or walking  unusual vaginal bleeding, discharge  yellowing of the eyes or skin Side effects that usually do not require medical attention (report to your doctor or health care professional if they continue or are bothersome):  acne  breast pain  change in sex drive or performance  changes in weight  cramping, dizziness, or faintness while the device is being inserted  headache  irregular menstrual bleeding within first 3 to 6 months of use  nausea This list may not describe all possible side effects. Call your doctor for medical advice about side effects. You may report side effects to FDA at 1-800-FDA-1088. Where should I keep my medicine? This does not apply. NOTE: This sheet is a summary. It may not cover all possible information. If you have questions about this medicine, talk to your doctor, pharmacist, or health care provider.  2020 Elsevier/Gold Standard (2018-06-29 13:22:01)   Preventive Care 44-34 Years Old, Female Preventive care refers to visits with your health care provider and lifestyle choices that can promote health and wellness. This  includes:  A yearly physical exam. This may also be called an annual well check.  Regular dental visits and eye exams.  Immunizations.  Screening for certain conditions.  Healthy lifestyle choices, such as eating a healthy diet, getting regular exercise, not using drugs or products that contain nicotine and tobacco, and limiting alcohol use. What can I expect for my preventive care visit? Physical exam Your health care provider will check your:  Height and weight. This may be used to calculate body mass index (BMI), which tells if you are at a healthy weight.  Heart rate and blood pressure.  Skin for abnormal spots. Counseling Your health care provider may ask you questions about your:  Alcohol, tobacco, and drug use.  Emotional well-being.  Home and relationship well-being.  Sexual activity.  Eating habits.  Work and work Statistician.  Method of birth control.  Menstrual cycle.  Pregnancy history. What immunizations do I need?  Influenza (flu) vaccine  This is recommended every year. Tetanus, diphtheria, and pertussis (Tdap) vaccine  You may need a Td booster every 10 years. Varicella (chickenpox) vaccine  You may need this if you have not been vaccinated. Zoster (shingles) vaccine  You may need this after age 61. Measles, mumps, and rubella (MMR) vaccine  You may need at least  one dose of MMR if you were born in 1957 or later. You may also need a second dose. Pneumococcal conjugate (PCV13) vaccine  You may need this if you have certain conditions and were not previously vaccinated. Pneumococcal polysaccharide (PPSV23) vaccine  You may need one or two doses if you smoke cigarettes or if you have certain conditions. Meningococcal conjugate (MenACWY) vaccine  You may need this if you have certain conditions. Hepatitis A vaccine  You may need this if you have certain conditions or if you travel or work in places where you may be exposed to hepatitis  A. Hepatitis B vaccine  You may need this if you have certain conditions or if you travel or work in places where you may be exposed to hepatitis B. Haemophilus influenzae type b (Hib) vaccine  You may need this if you have certain conditions. Human papillomavirus (HPV) vaccine  If recommended by your health care provider, you may need three doses over 6 months. You may receive vaccines as individual doses or as more than one vaccine together in one shot (combination vaccines). Talk with your health care provider about the risks and benefits of combination vaccines. What tests do I need? Blood tests  Lipid and cholesterol levels. These may be checked every 5 years, or more frequently if you are over 41 years old.  Hepatitis C test.  Hepatitis B test. Screening  Lung cancer screening. You may have this screening every year starting at age 78 if you have a 30-pack-year history of smoking and currently smoke or have quit within the past 15 years.  Colorectal cancer screening. All adults should have this screening starting at age 81 and continuing until age 38. Your health care provider may recommend screening at age 78 if you are at increased risk. You will have tests every 1-10 years, depending on your results and the type of screening test.  Diabetes screening. This is done by checking your blood sugar (glucose) after you have not eaten for a while (fasting). You may have this done every 1-3 years.  Mammogram. This may be done every 1-2 years. Talk with your health care provider about when you should start having regular mammograms. This may depend on whether you have a family history of breast cancer.  BRCA-related cancer screening. This may be done if you have a family history of breast, ovarian, tubal, or peritoneal cancers.  Pelvic exam and Pap test. This may be done every 3 years starting at age 46. Starting at age 4, this may be done every 5 years if you have a Pap test in  combination with an HPV test. Other tests  Sexually transmitted disease (STD) testing.  Bone density scan. This is done to screen for osteoporosis. You may have this scan if you are at high risk for osteoporosis. Follow these instructions at home: Eating and drinking  Eat a diet that includes fresh fruits and vegetables, whole grains, lean protein, and low-fat dairy.  Take vitamin and mineral supplements as recommended by your health care provider.  Do not drink alcohol if: ? Your health care provider tells you not to drink. ? You are pregnant, may be pregnant, or are planning to become pregnant.  If you drink alcohol: ? Limit how much you have to 0-1 drink a day. ? Be aware of how much alcohol is in your drink. In the [place] one drink equals one 12 oz bottle of beer (355 mL), one 5 oz glass of wine (148  mL), or one 1 oz glass of hard liquor (44 mL). Lifestyle  Take daily care of your teeth and gums.  Stay active. Exercise for at least 30 minutes on 5 or more days each week.  Do not use any products that contain nicotine or tobacco, such as cigarettes, e-cigarettes, and chewing tobacco. If you need help quitting, ask your health care provider.  If you are sexually active, practice safe sex. Use a condom or other form of birth control (contraception) in order to prevent pregnancy and STIs (sexually transmitted infections).  If told by your health care provider, take low-dose aspirin daily starting at age 14. What's next?  Visit your health care provider once a year for a well check visit.  Ask your health care provider how often you should have your eyes and teeth checked.  Stay up to date on all vaccines. This information is not intended to replace advice given to you by your health care provider. Make sure you discuss any questions you have with your health care provider. Document Revised: 04/29/2018 Document Reviewed: 04/29/2018 Elsevier Patient Education  2020 [place] Breast self-awareness is knowing how your breasts look and feel. Doing breast self-awareness is important. It allows you to catch a breast problem early while it is still small and can be treated. All women should do breast self-awareness, including women who have had breast implants. Tell your doctor if you notice a change in your breasts. What you need:  A mirror.  A well-lit room. How to do a breast self-exam A breast self-exam is one way to learn what is normal for your breasts and to check for changes. To do a breast self-exam: Look for changes  1. Take off all the clothes above your waist. 2. Stand in front of a mirror in a room with good lighting. 3. Put your hands on your hips. 4. Push your hands down. 5. Look at your breasts and nipples in the mirror to see if one breast or nipple looks different from the other. Check to see if: ? The shape of one breast is different. ? The size of one breast is different. ? There are wrinkles, dips, and bumps in one breast and not the other. 6. Look at each breast for changes in the skin, such as: ? Redness. ? Scaly areas. 7. Look for changes in your nipples, such as: ? Liquid around the nipples. ? Bleeding. ? Dimpling. ? Redness. ? A change in where the nipples are. Feel for changes  1. Lie on your back on the floor. 2. Feel each breast. To do this, follow these steps: ? Pick a breast to feel. ? Put the arm closest to that breast above your head. ? Use your other arm to feel the nipple area of your breast. Feel the area with the pads of your three middle fingers by making small circles with your fingers. For the first circle, press lightly. For the second circle, press harder. For the third circle, press even harder. ? Keep making circles with your fingers at the different pressures as you move down your breast. Stop when you feel your ribs. ? Move your fingers a little toward the center of your  body. ? Start making circles with your fingers again, this time going up until you reach your collarbone. ? Keep making up-and-down circles until you reach your armpit. Remember to keep using the three pressures. ? Feel the other breast in the same way. 3.  Sit or stand in the tub or shower. 4. With soapy water on your skin, feel each breast the same way you did in step 2 when you were lying on the floor. Write down what you find Writing down what you find can help you remember what to tell your doctor. Write down:  What is normal for each breast.  Any changes you find in each breast, including: ? The kind of changes you find. ? Whether you have pain. ? Size and location of any lumps.  When you last had your menstrual period. General tips  Check your breasts every month.  If you are breastfeeding, the best time to check your breasts is after you feed your baby or after you use a breast pump.  If you get menstrual periods, the best time to check your breasts is 5-7 days after your menstrual period is over.  With time, you will become comfortable with the self-exam, and you will begin to know if there are changes in your breasts. Contact a doctor if you:  See a change in the shape or size of your breasts or nipples.  See a change in the skin of your breast or nipples, such as red or scaly skin.  Have fluid coming from your nipples that is not normal.  Find a lump or thick area that was not there before.  Have pain in your breasts.  Have any concerns about your breast health. Summary  Breast self-awareness includes looking for changes in your breasts, as well as feeling for changes within your breasts.  Breast self-awareness should be done in front of a mirror in a well-lit room.  You should check your breasts every month. If you get menstrual periods, the best time to check your breasts is 5-7 days after your menstrual period is over.  Let your doctor know of any changes  you see in your breasts, including changes in size, changes on the skin, pain or tenderness, or fluid from your nipples that is not normal. This information is not intended to replace advice given to you by your health care provider. Make sure you discuss any questions you have with your health care provider. Document Revised: 04/06/2018 Document Reviewed: 04/06/2018 Elsevier Patient Education  [place].

## 2020-02-08 ENCOUNTER — Ambulatory Visit: Payer: BC Managed Care – PPO | Attending: Neurology

## 2020-02-08 DIAGNOSIS — G4733 Obstructive sleep apnea (adult) (pediatric): Secondary | ICD-10-CM | POA: Insufficient documentation

## 2020-02-09 ENCOUNTER — Other Ambulatory Visit: Payer: Self-pay

## 2020-02-18 DIAGNOSIS — R202 Paresthesia of skin: Secondary | ICD-10-CM | POA: Insufficient documentation

## 2020-02-18 DIAGNOSIS — R252 Cramp and spasm: Secondary | ICD-10-CM | POA: Insufficient documentation

## 2020-02-22 ENCOUNTER — Other Ambulatory Visit: Payer: Self-pay | Admitting: Family Medicine

## 2020-02-22 DIAGNOSIS — I10 Essential (primary) hypertension: Secondary | ICD-10-CM

## 2020-03-02 ENCOUNTER — Other Ambulatory Visit: Payer: Self-pay

## 2020-03-02 MED ORDER — FEXOFENADINE HCL 180 MG PO TABS: ORAL_TABLET | ORAL | 3 refills | Status: DC

## 2020-04-21 ENCOUNTER — Other Ambulatory Visit: Payer: Self-pay | Admitting: Family Medicine

## 2020-04-23 ENCOUNTER — Other Ambulatory Visit: Payer: Self-pay

## 2020-04-30 ENCOUNTER — Other Ambulatory Visit: Payer: Self-pay | Admitting: Family Medicine

## 2020-05-10 ENCOUNTER — Telehealth: Payer: Self-pay | Admitting: Family Medicine

## 2020-05-10 NOTE — Telephone Encounter (Signed)
Medication Refill - Medication: Pepcid   Has the patient contacted their pharmacy? Yes.   (Agent: If no, request that the patient contact the pharmacy for the refill.) (Agent: If yes, when and what did the pharmacy advise?)  Preferred Pharmacy (with phone number or street name):  [facility], [address] [place] [address]  Phone: [phone] Fax: [phone]  Hours: Not open 24 hours       Reference # [id number] Agent: Please be advised that RX refills may take up to 3 business days. We ask that you follow-up with your pharmacy.

## 2020-05-10 NOTE — Telephone Encounter (Signed)
Attempted to call patient to verify she actually needs Pepcid. Pepcid is not current n medication list- Pantoprazole is current and she should have RF. Unable to reach patient- number not currently active.

## 2020-05-11 NOTE — Telephone Encounter (Signed)
Tried to call, no answer

## 2020-05-18 NOTE — Progress Notes (Signed)
Name: CAIDANCE SYBERT   MRN: [id number]    DOB: 1975-11-21   Date:05/21/2020       Progress Note  Chief Complaint  Patient presents with  . Follow-up  . Hypertension  . Insomnia     Subjective:   Carrie Cross is a 44 y.o. female, presents to clinic for routine f/up and also acute complaints  Nerve pain, tingling, numbness wakes her up every night, she has chronic neck and back pain, s/p surgery per [facility] - She is no longer seeing them.   Pain and tingling mostly in fingers and left forearm.  She also has some in LE and cramps to calves that sometimes wake her up.  Neurology - Dr. Melrose Nakayama did a sleep study which was negative and he had tx her with gabapentin 300 -600 mg at bedtime - she said it made her very tired but didn't help with the paresthesias/numbness  She can't afford any more specialists - didn't go for f/up with neurology but asks if we can help Hasn't tried lyrica before, cymbalta, she is no longer on ambien or trazodone She does think a large part of her insomnia was other things that were bothering her that she didn't realize at the time.   Hyperlipidemia: Currently treated with Lipitor 20 mg qd, pt reports  med compliance Last Lipids: Lab Results  Component Value Date   CHOL 139 11/14/2019   HDL 59 11/14/2019   LDLCALC 66 11/14/2019   TRIG 48 11/14/2019   CHOLHDL 2.4 11/14/2019   - Denies: Chest pain, shortness of breath, myalgias, claudication   Hypertension:  Currently managed on Amlodipine 10mg  qd and Metoprolol 100 mg qd Pt reports good  med compliance and denies any SE.   Blood pressure today is elevated/uncontrolled She asks if she can take meds together (possible poor med compliance?) BP Readings from Last 6 Encounters:  05/21/20 (!) 142/80  01/27/20 137/79  11/17/19 126/78  06/13/19 132/74  05/13/19 (!) 164/92  04/25/19 (!) 150/92   Pt denies CP, SOB, exertional sx, LE edema, palpitation, Ha's, visual  disturbances, lightheadedness, hypotension, syncope.  GERD:  Pantoprazole - sx not as well controlled as when she was taking omeprazole  Prediabetes - last A1C 6.3     Current Outpatient Medications:  .  amitriptyline (ELAVIL) 10 MG tablet, Take by mouth., Disp: , Rfl:  .  amLODipine (NORVASC) 10 MG tablet, TAKE 1 TABLET(10 MG) BY MOUTH DAILY, Disp: 90 tablet, Rfl: 3 .  atorvastatin (LIPITOR) 20 MG tablet, TAKE 1 TABLET AT BEDTIME, Disp: 90 tablet, Rfl: 3 .  clobetasol ointment (TEMOVATE) 9.32 %, Apply 1 application topically 2 (two) times daily. Apply to affected area, Disp: 30 g, Rfl: 2 .  fexofenadine (ALLEGRA) 180 MG tablet, TAKE 1 TABLET BY MOUTH EVERY DAY FOR ALLERGIES OR SINUS, Disp: 90 tablet, Rfl: 3 .  fluticasone (FLONASE) 50 MCG/ACT nasal spray, USE 2 SPRAYS IN EACH       NOSTRIL DAILY., Disp: 48 g, Rfl: 1 .  ipratropium (ATROVENT) 0.03 % nasal spray, Place 2 sprays into both nostrils 3 (three) times daily. (Patient not taking: Reported on 01/27/2020), Disp: 30 mL, Rfl: 12 .  levonorgestrel (MIRENA) 20 MCG/24HR IUD, by Intrauterine route. Reported on 12/13/2015, Disp: , Rfl:  .  metoprolol succinate (TOPROL-XL) 100 MG 24 hr tablet, TAKE 1 TABLET DAILY WITH ORIMMEDIATELY FOLLOWING A    MEAL, Disp: 90 tablet, Rfl: 3 .  pantoprazole (PROTONIX) 20 MG tablet, TAKE  1 TABLET DAILY, Disp: 90 tablet, Rfl: 3 .  traZODone (DESYREL) 50 MG tablet, Take 0.5-1 tablets (25-50 mg total) by mouth at bedtime as needed for sleep. (Patient not taking: Reported on 01/27/2020), Disp: 30 tablet, Rfl: 3 .  zolpidem (AMBIEN CR) 6.25 MG CR tablet, Take 1 tablet (6.25 mg total) by mouth at bedtime as needed for sleep. (Patient not taking: Reported on 01/27/2020), Disp: 10 tablet, Rfl: 0  Patient Active Problem List   Diagnosis Date Noted  . Muscle cramps 02/18/2020  . Tingling 02/18/2020  . IUD (intrauterine device) in place 01/27/2020  . Difficulty sleeping 01/08/2020  . Insomnia 11/18/2019  . Prediabetes  04/25/2019  . Class 1 obesity with serious comorbidity and body mass index (BMI) of 30.0 to 30.9 in adult 04/25/2019  . Herpes simplex 08/10/2015  . Eczema 05/11/2015  . Hypertension   . HLD (hyperlipidemia)   . GERD (gastroesophageal reflux disease)   . Allergic rhinitis   . Chronic migraine     Past Surgical History:  Procedure Laterality Date  . BACK SURGERY  [id number]   disc shaved down  . CERVICAL DISC ARTHROPLASTY  [id number]    Family History  Problem Relation Age of Onset  . Breast cancer Maternal Grandmother 80  . Heart disease Maternal Grandfather   . Stroke Neg Hx   . Diabetes Neg Hx   . Ovarian cancer Neg Hx   . Colon cancer Neg Hx     Social History   Tobacco Use  . Smoking status: Former Smoker    Packs/day: 0.50    Years: 7.00    Pack years: 3.50    Types: Cigarettes    Quit date: 06/02/2011    Years since quitting: 8.9  . Smokeless tobacco: Never Used  Vaping Use  . Vaping Use: Never used  Substance Use Topics  . Alcohol use: Yes    Alcohol/week: 3.0 standard drinks    Types: 3 Glasses of wine per week  . Drug use: No     No Known Allergies  Health Maintenance  Topic Date Due  . Hepatitis C Screening  Never done  . MAMMOGRAM  03/09/2020  . TETANUS/TDAP  07/01/2020  . PAP SMEAR-Modifier  09/10/2020  . INFLUENZA VACCINE  Completed  . COVID-19 Vaccine  Completed  . HIV Screening  Addressed    Chart Review Today: I personally reviewed active problem list, medication list, allergies, family history, social history, health maintenance, notes from last encounter, lab results, imaging with the patient/caregiver today.   Review of Systems  10 Systems reviewed and are negative for acute change except as noted in the HPI.  Objective:   Vitals:   05/21/20 1507  BP: (!) 142/80  Pulse: 80  Resp: 16  Temp: 98.1 F (36.7 C)  TempSrc: Oral  SpO2: 99%  Weight: 163 lb (73.9 kg)  Height: 5\' 4"  (1.626 m)    Body mass index is 27.98  kg/m.  Physical Exam Vitals and nursing note reviewed.  Constitutional:      General: She is not in acute distress.    Appearance: Normal appearance. She is well-developed. She is not ill-appearing, toxic-appearing or diaphoretic.     Interventions: Face mask in place.  HENT:     Head: Normocephalic and atraumatic.     Right Ear: External ear normal.     Left Ear: External ear normal.  Eyes:     General: Lids are normal. No scleral icterus.  Right eye: No discharge.        Left eye: No discharge.     Conjunctiva/sclera: Conjunctivae normal.  Neck:     Trachea: Phonation normal. No tracheal deviation.  Cardiovascular:     Rate and Rhythm: Normal rate and regular rhythm.     Pulses: Normal pulses.          Radial pulses are 2+ on the right side and 2+ on the left side.     Heart sounds: Normal heart sounds. No murmur heard.  No friction rub. No gallop.   Pulmonary:     Effort: Pulmonary effort is normal. No respiratory distress.     Breath sounds: Normal breath sounds. No stridor. No wheezing, rhonchi or rales.  Abdominal:     General: Bowel sounds are normal. There is no distension.     Palpations: Abdomen is soft.  Musculoskeletal:     Right lower leg: No edema.     Left lower leg: No edema.  Skin:    General: Skin is warm and dry.     Coloration: Skin is not jaundiced or pale.     Findings: No rash.  Neurological:     Mental Status: She is alert.     Motor: No abnormal muscle tone.     Gait: Gait normal.  Psychiatric:        Mood and Affect: Mood normal.        Speech: Speech normal.        Behavior: Behavior normal.         Assessment & Plan:     ICD-10-CM   1. Essential hypertension  I10    BP elevated today, possible med compliance issues? encouraged to take both meds as prescribed, safe together, in pain, will recheck at f/up visit  2. Mixed hyperlipidemia  E78.2    compliant with meds, no myalgias, labs done this am  3. Prediabetes  R73.03     monitor - keep working on healthy diet and exercise  4. Insomnia, unspecified type  G47.00    chronic, still ongoing, she has stopped meds and she feels its improved some   5. Gastroesophageal reflux disease, unspecified whether esophagitis present  K21.9 omeprazole (PRILOSEC) 20 MG capsule   protonix not as effective as omeprazole- rx changed, avoid food and lifestyle triggers  6. Encounter for hepatitis C screening test for low risk patient  Z11.59   7. Encounter for medication monitoring  Z51.81   8. Muscle cramps  R25.2    continue to push hydration, may try electrolyte replacement solutions/packets, can try mag supplement  9. Other polyneuropathy  G62.89 pregabalin (LYRICA) 25 MG capsule   saw neuro earlier this year - did not f/up, labs were done this am prior to appt, will try to add on B12 and iron panel to see if either could be cause?   10. Chronic pain syndrome  G89.4 pregabalin (LYRICA) 25 MG capsule   neck and back, hx of recent surgery and cervical radiculopathy  11. Fatigue, unspecified type  R53.83 B12    Fe+TIBC+Fer   r/o anemia, electrolyte imbalance, b12 defiency - likely some fatigue secondary to insomnia, increased situational stress/mood     Return in about 6 months (around 11/18/2020) for Routine follow-up.   Delsa Grana, PA-C 05/21/20 3:32 PM

## 2020-05-21 ENCOUNTER — Ambulatory Visit (INDEPENDENT_AMBULATORY_CARE_PROVIDER_SITE_OTHER): Payer: BC Managed Care – PPO | Admitting: Family Medicine

## 2020-05-21 ENCOUNTER — Other Ambulatory Visit: Payer: Self-pay | Admitting: Family Medicine

## 2020-05-21 ENCOUNTER — Other Ambulatory Visit: Payer: Self-pay

## 2020-05-21 ENCOUNTER — Encounter: Payer: Self-pay | Admitting: Family Medicine

## 2020-05-21 VITALS — BP 142/80 | HR 80 | Temp 98.1°F | Resp 16 | Ht 64.0 in | Wt 163.0 lb

## 2020-05-21 DIAGNOSIS — Z5181 Encounter for therapeutic drug level monitoring: Secondary | ICD-10-CM

## 2020-05-21 DIAGNOSIS — E669 Obesity, unspecified: Secondary | ICD-10-CM

## 2020-05-21 DIAGNOSIS — Z1231 Encounter for screening mammogram for malignant neoplasm of breast: Secondary | ICD-10-CM

## 2020-05-21 DIAGNOSIS — G4700 Insomnia, unspecified: Secondary | ICD-10-CM | POA: Diagnosis not present

## 2020-05-21 DIAGNOSIS — G6289 Other specified polyneuropathies: Secondary | ICD-10-CM

## 2020-05-21 DIAGNOSIS — R7303 Prediabetes: Secondary | ICD-10-CM

## 2020-05-21 DIAGNOSIS — I10 Essential (primary) hypertension: Secondary | ICD-10-CM

## 2020-05-21 DIAGNOSIS — G894 Chronic pain syndrome: Secondary | ICD-10-CM

## 2020-05-21 DIAGNOSIS — E782 Mixed hyperlipidemia: Secondary | ICD-10-CM

## 2020-05-21 DIAGNOSIS — R252 Cramp and spasm: Secondary | ICD-10-CM

## 2020-05-21 DIAGNOSIS — R5383 Other fatigue: Secondary | ICD-10-CM

## 2020-05-21 DIAGNOSIS — K219 Gastro-esophageal reflux disease without esophagitis: Secondary | ICD-10-CM

## 2020-05-21 DIAGNOSIS — Z1159 Encounter for screening for other viral diseases: Secondary | ICD-10-CM

## 2020-05-21 MED ORDER — OMEPRAZOLE 20 MG PO CPDR: 20.0000 mg | DELAYED_RELEASE_CAPSULE | Freq: Every day | ORAL | 3 refills | Status: DC

## 2020-05-21 MED ORDER — PREGABALIN 25 MG PO CAPS: 25.0000 mg | ORAL_CAPSULE | Freq: Two times a day (BID) | ORAL | 1 refills | Status: DC

## 2020-05-22 ENCOUNTER — Encounter: Payer: Self-pay | Admitting: Family Medicine

## 2020-05-24 LAB — CBC WITH DIFFERENTIAL/PLATELET
Absolute Monocytes: 489 cells/uL (ref 200–950)
Basophils Absolute: 29 cells/uL (ref 0–200)
Basophils Relative: 0.4 %
Eosinophils Absolute: 110 cells/uL (ref 15–500)
Eosinophils Relative: 1.5 %
HCT: 35.5 % (ref 35.0–45.0)
Hemoglobin: 12 g/dL (ref 11.7–15.5)
Lymphs Abs: 2701 cells/uL (ref 850–3900)
MCH: 31.3 pg (ref 27.0–33.0)
MCHC: 33.8 g/dL (ref 32.0–36.0)
MCV: 92.7 fL (ref 80.0–100.0)
MPV: 8.9 fL (ref 7.5–12.5)
Monocytes Relative: 6.7 %
Neutro Abs: 3971 cells/uL (ref 1500–7800)
Neutrophils Relative %: 54.4 %
Platelets: 381 10*3/uL (ref 140–400)
RBC: 3.83 10*6/uL (ref 3.80–5.10)
RDW: 13 % (ref 11.0–15.0)
Total Lymphocyte: 37 %
WBC: 7.3 10*3/uL (ref 3.8–10.8)

## 2020-05-24 LAB — IRON,TIBC AND FERRITIN PANEL
%SAT: 32 % (calc) (ref 16–45)
Ferritin: 76 ng/mL (ref 16–232)
Iron: 113 ug/dL (ref 40–190)
TIBC: 353 mcg/dL (calc) (ref 250–450)

## 2020-05-24 LAB — COMPLETE METABOLIC PANEL WITH GFR
AG Ratio: 1.8 (calc) (ref 1.0–2.5)
ALT: 12 U/L (ref 6–29)
AST: 13 U/L (ref 10–30)
Albumin: 4.4 g/dL (ref 3.6–5.1)
Alkaline phosphatase (APISO): 68 U/L (ref 31–125)
BUN: 12 mg/dL (ref 7–25)
CO2: 26 mmol/L (ref 20–32)
Calcium: 9.4 mg/dL (ref 8.6–10.2)
Chloride: 102 mmol/L (ref 98–110)
Creat: 0.69 mg/dL (ref 0.50–1.10)
GFR, Est African American: 124 mL/min/{1.73_m2} (ref >=60)
GFR, Est Non African American: 107 mL/min/{1.73_m2} (ref >=60)
Globulin: 2.4 g/dL (calc) (ref 1.9–3.7)
Glucose, Bld: 117 mg/dL — ABNORMAL HIGH (ref 65–99)
Potassium: 3.8 mmol/L (ref 3.5–5.3)
Sodium: 137 mmol/L (ref 135–146)
Total Bilirubin: 0.4 mg/dL (ref 0.2–1.2)
Total Protein: 6.8 g/dL (ref 6.1–8.1)

## 2020-05-24 LAB — VITAMIN B12: Vitamin B-12: 466 pg/mL (ref 200–1100)

## 2020-05-24 LAB — TEST AUTHORIZATION

## 2020-05-24 LAB — LIPID PANEL
Cholesterol: 130 mg/dL (ref <200)
HDL: 59 mg/dL (ref >=50)
LDL Cholesterol (Calc): 56 mg/dL (calc)
Non-HDL Cholesterol (Calc): 71 mg/dL (calc) (ref <130)
Total CHOL/HDL Ratio: 2.2 (calc) (ref <5.0)
Triglycerides: 74 mg/dL (ref <150)

## 2020-05-24 LAB — HEMOGLOBIN A1C
Hgb A1c MFr Bld: 6.2 % of total Hgb — ABNORMAL HIGH (ref <5.7)
Mean Plasma Glucose: 131 (calc)
eAG (mmol/L): 7.3 (calc)

## 2020-07-12 ENCOUNTER — Other Ambulatory Visit: Payer: Self-pay

## 2020-07-12 ENCOUNTER — Ambulatory Visit
Admission: RE | Admit: 2020-07-12 | Discharge: 2020-07-12 | Disposition: A | Discharge status: Home / Self Care | Payer: BC Managed Care – PPO | Source: Ambulatory Visit | Attending: Family Medicine | Admitting: Family Medicine

## 2020-07-12 DIAGNOSIS — Z1231 Encounter for screening mammogram for malignant neoplasm of breast: Secondary | ICD-10-CM | POA: Insufficient documentation

## 2020-07-23 ENCOUNTER — Encounter: Payer: Self-pay | Admitting: Family Medicine

## 2020-07-23 DIAGNOSIS — G6289 Other specified polyneuropathies: Secondary | ICD-10-CM

## 2020-07-23 DIAGNOSIS — G894 Chronic pain syndrome: Secondary | ICD-10-CM

## 2020-07-24 MED ORDER — PREGABALIN 25 MG PO CAPS: 25.0000 mg | ORAL_CAPSULE | Freq: Two times a day (BID) | ORAL | 1 refills | Status: DC

## 2020-07-31 ENCOUNTER — Encounter: Payer: Self-pay | Admitting: Family Medicine

## 2020-08-07 ENCOUNTER — Other Ambulatory Visit: Payer: Self-pay

## 2020-08-07 ENCOUNTER — Ambulatory Visit (INDEPENDENT_AMBULATORY_CARE_PROVIDER_SITE_OTHER): Payer: BC Managed Care – PPO | Admitting: Family Medicine

## 2020-08-07 ENCOUNTER — Encounter: Payer: Self-pay | Admitting: Family Medicine

## 2020-08-07 VITALS — BP 120/72 | HR 82 | Temp 98.4°F | Resp 16 | Ht 64.0 in | Wt 167.9 lb

## 2020-08-07 DIAGNOSIS — Z23 Encounter for immunization: Secondary | ICD-10-CM

## 2020-08-07 DIAGNOSIS — M79646 Pain in unspecified finger(s): Secondary | ICD-10-CM | POA: Diagnosis not present

## 2020-08-07 MED ORDER — VALACYCLOVIR HCL 1 G PO TABS: 1000.0000 mg | ORAL_TABLET | Freq: Two times a day (BID) | ORAL | 0 refills | Status: DC

## 2020-08-07 NOTE — Progress Notes (Signed)
Patient ID: Carrie Cross, female    DOB: [birth year], 44 y.o.   MRN: [id number]  PCP: Delsa Grana, PA-C  Chief Complaint  Patient presents with  . Hand Pain    middle finger both hands, pain, knot right worse then left for 1 month    Subjective:   Carrie Cross is a 44 y.o. female, presents to clinic with CC of the following:  HPI  Patient presents with 1 month of bilateral finger pain.  While ago she had right middle finger pain and trigger finger, this did improve and about a month ago she developed similar symptoms.  Feels achy to her middle and distal phalanx but then in the last month she also developed left middle finger pain she has some erythema and swelling which she notes around both nailbeds and moving up her finger.  She has normal range of motion.  She has a small bump on her left distal middle phalanx that is very tender, and has not had any drainage.  She has her nails done a few days ago but she notes that the redness around both nailbeds has been ongoing for several weeks.   She did try some antibiotics at home with leftover doxycycline She has no associated injury She knows she has some arthritis in her neck and back but has never had arthritis in any of her fingers or knuckles that she knows of   Patient Active Problem List   Diagnosis Date Noted  . Muscle cramps 02/18/2020  . Tingling 02/18/2020  . IUD (intrauterine device) in place 01/27/2020  . Difficulty sleeping 01/08/2020  . Insomnia 11/18/2019  . Prediabetes 04/25/2019  . Class 1 obesity with serious comorbidity and body mass index (BMI) of 30.0 to 30.9 in adult 04/25/2019  . Herpes simplex 08/10/2015  . Eczema 05/11/2015  . Hypertension   . HLD (hyperlipidemia)   . GERD (gastroesophageal reflux disease)   . Allergic rhinitis   . Chronic migraine       Current Outpatient Medications:  .  amLODipine (NORVASC) 10 MG tablet, TAKE 1 TABLET(10 MG) BY MOUTH DAILY, Disp: 90 tablet, Rfl:  3 .  atorvastatin (LIPITOR) 20 MG tablet, TAKE 1 TABLET AT BEDTIME, Disp: 90 tablet, Rfl: 3 .  clobetasol ointment (TEMOVATE) 1.66 %, Apply 1 application topically 2 (two) times daily. Apply to affected area, Disp: 30 g, Rfl: 2 .  fexofenadine (ALLEGRA) 180 MG tablet, TAKE 1 TABLET BY MOUTH EVERY DAY FOR ALLERGIES OR SINUS, Disp: 90 tablet, Rfl: 3 .  fluticasone (FLONASE) 50 MCG/ACT nasal spray, USE 2 SPRAYS IN EACH       NOSTRIL DAILY., Disp: 48 g, Rfl: 1 .  levonorgestrel (MIRENA) 20 MCG/24HR IUD, by Intrauterine route. Reported on 12/13/2015, Disp: , Rfl:  .  metoprolol succinate (TOPROL-XL) 100 MG 24 hr tablet, TAKE 1 TABLET DAILY WITH ORIMMEDIATELY FOLLOWING A    MEAL, Disp: 90 tablet, Rfl: 3 .  omeprazole (PRILOSEC) 20 MG capsule, Take 1 capsule (20 mg total) by mouth daily before breakfast., Disp: 90 capsule, Rfl: 3 .  pregabalin (LYRICA) 25 MG capsule, Take 1 capsule (25 mg total) by mouth 2 (two) times daily., Disp: 180 capsule, Rfl: 1 .  ipratropium (ATROVENT) 0.03 % nasal spray, Place 2 sprays into both nostrils 3 (three) times daily. (Patient not taking: Reported on 01/27/2020), Disp: 30 mL, Rfl: 12   No Known Allergies   Social History   Tobacco Use  . Smoking status: Former Smoker  Packs/day: 0.50    Years: 7.00    Pack years: 3.50    Types: Cigarettes    Quit date: 06/02/2011    Years since quitting: 9.1  . Smokeless tobacco: Never Used  Vaping Use  . Vaping Use: Never used  Substance Use Topics  . Alcohol use: Yes    Alcohol/week: 3.0 standard drinks    Types: 3 Glasses of wine per week  . Drug use: No      Chart Review Today: I personally reviewed active problem list, medication list, allergies, family history, social history, health maintenance, notes from last encounter, lab results, imaging with the patient/caregiver today.   Review of Systems 10 Systems reviewed and are negative for acute change except as noted in the HPI.     Objective:   Vitals:    08/07/20 1343  BP: 120/72  Pulse: 82  Resp: 16  Temp: 98.4 F (36.9 C)  TempSrc: Oral  SpO2: 99%  Weight: 167 lb 14.4 oz (76.2 kg)  Height: 5\' 4"  (1.626 m)    Body mass index is 28.82 kg/m.  Physical Exam Vitals and nursing note reviewed.  Constitutional:      General: She is not in acute distress.    Appearance: Normal appearance. She is well-developed. She is not ill-appearing, toxic-appearing or diaphoretic.  HENT:     Head: Normocephalic and atraumatic.     Nose: Nose normal.  Eyes:     General:        Right eye: No discharge.        Left eye: No discharge.     Conjunctiva/sclera: Conjunctivae normal.  Neck:     Trachea: No tracheal deviation.  Cardiovascular:     Rate and Rhythm: Normal rate and regular rhythm.  Pulmonary:     Effort: Pulmonary effort is normal. No respiratory distress.     Breath sounds: No stridor.  Musculoskeletal:        General: Normal range of motion.     Right hand: Tenderness present. No deformity. Normal range of motion. Normal strength. Normal sensation. Normal capillary refill. Abnormal pulse.     Left hand: Tenderness present. No deformity. Normal range of motion. Normal strength. Normal sensation. Normal capillary refill. Normal pulse.     Comments: Bilateral hands and fingers distal middle finger bilaterally has some erythema at the distal phalanx at at the proximal nail bed Left distal middle finger has a erythematous tender papule to the lateral aspect Normal range of motion of fingers bilaterally no noted trigger finger or nodules to palmar or volar aspect No noted edema more proximal on b/l middle fingers but pt notes pain to middle and distal phalanx  Skin:    General: Skin is warm and dry.     Findings: No rash.  Neurological:     Mental Status: She is alert.     Motor: No abnormal muscle tone.     Coordination: Coordination normal.  Psychiatric:        Behavior: Behavior normal.      Results for orders placed or  performed in visit on 05/21/20  Hemoglobin A1c  Result Value Ref Range   Hgb A1c MFr Bld 6.2 (H) <5.7 % of total Hgb   Mean Plasma Glucose 131 (calc)   eAG (mmol/L) 7.3 (calc)  Lipid panel  Result Value Ref Range   Cholesterol 130 <200 mg/dL   HDL 59 > OR = 50 mg/dL   Triglycerides 74 <150 mg/dL   LDL Cholesterol (  Calc) 56 mg/dL (calc)   Total CHOL/HDL Ratio 2.2 <5.0 (calc)   Non-HDL Cholesterol (Calc) 71 <130 mg/dL (calc)  CBC with Differential/Platelet  Result Value Ref Range   WBC 7.3 3.8 - 10.8 Thousand/uL   RBC 3.83 3.80 - 5.10 Million/uL   Hemoglobin 12.0 11.7 - 15.5 g/dL   HCT 35.5 35 - 45 %   MCV 92.7 80.0 - 100.0 fL   MCH 31.3 27.0 - 33.0 pg   MCHC 33.8 32.0 - 36.0 g/dL   RDW 13.0 11.0 - 15.0 %   Platelets 381 140 - 400 Thousand/uL   MPV 8.9 7.5 - 12.5 fL   Neutro Abs 3,971 1,500 - 7,800 cells/uL   Lymphs Abs 2,701 850 - 3,900 cells/uL   Absolute Monocytes 489 200 - 950 cells/uL   Eosinophils Absolute 110 15.0 - 500.0 cells/uL   Basophils Absolute 29 0.0 - 200.0 cells/uL   Neutrophils Relative % 54.4 %   Total Lymphocyte 37.0 %   Monocytes Relative 6.7 %   Eosinophils Relative 1.5 %   Basophils Relative 0.4 %  COMPLETE METABOLIC PANEL WITH GFR  Result Value Ref Range   Glucose, Bld 117 (H) 65 - 99 mg/dL   BUN 12 7 - 25 mg/dL   Creat 0.69 0.50 - 1.10 mg/dL   GFR, Est Non African American 107 > OR = 60 mL/min/1.46m2   GFR, Est African American 124 > OR = 60 mL/min/1.87m2   BUN/Creatinine Ratio NOT APPLICABLE 6 - 22 (calc)   Sodium 137 135 - 146 mmol/L   Potassium 3.8 3.5 - 5.3 mmol/L   Chloride 102 98 - 110 mmol/L   CO2 26 20 - 32 mmol/L   Calcium 9.4 8.6 - 10.2 mg/dL   Total Protein 6.8 6.1 - 8.1 g/dL   Albumin 4.4 3.6 - 5.1 g/dL   Globulin 2.4 1.9 - 3.7 g/dL (calc)   AG Ratio 1.8 1.0 - 2.5 (calc)   Total Bilirubin 0.4 0.2 - 1.2 mg/dL   Alkaline phosphatase (APISO) 68 31 - 125 U/L   AST 13 10 - 30 U/L   ALT 12 6 - 29 U/L  Iron, TIBC and Ferritin Panel    Result Value Ref Range   Iron 113 40 - 190 mcg/dL   TIBC 353 250 - 450 mcg/dL (calc)   %SAT 32 16 - 45 % (calc)   Ferritin 76 16 - 232 ng/mL  Vitamin B12  Result Value Ref Range   Vitamin B-12 466 200 - 1,100 pg/mL  TEST AUTHORIZATION  Result Value Ref Range   TEST NAME: IRON, TIBC AND FERRITIN PANEL    TEST CODE: [id number]    CLIENT CONTACT: LESLIE SMITH    REPORT ALWAYS MESSAGE SIGNATURE         Assessment & Plan:   1. Pain of middle finger Bilateral, some mild erythema bilaterally to distal middle fingers and along proximal nail margins but it does not seem consistent with a paronychia no significant edema, no signs of hangnails.  Did not appreciate any cellulitis moving down both of her fingers but there was a tender papule to her left distal middle finger which remained to be most of a herpetic whitlow She has good range of motion, good capillary refill, no evidence of trigger finger bilaterally She could possibly have some arthritis in her knuckles but I could not appreciate any significant joint or soft tissue swelling other than the papule   We can try and treat possible herpetic  whitlow - f/up if any worsening - would then consider tx for possible cellulitis vs hand or derm eval for other causes  Valtrex sent in after discussion with pt, she wanted oral antibiotics but it just did not seem consistent with a bacterial infection.  2. . Need for tetanus, diphtheria, and acellular pertussis (Tdap) vaccine - Tdap vaccine greater than or equal to 7yo IM      Delsa Grana, PA-C 08/07/20 1:54 PM

## 2020-08-07 NOTE — Patient Instructions (Signed)
Try doing soaks in warm water with antiseptic soap like dial or hibiclens - it may be a virus in the skin - try valtrex and let me know if it doesn't improve    Herpetic Whitlow Herpetic whitlow is an infection that affects the skin of the fingers or hand. It is caused by human herpesvirus, also called herpes simplex virus (HSV). The infection can come back (recur), but the first occurrence is usually the most severe. HSV commonly affects the mouth and genitals, but it can affect many other parts of the body. Some people who get herpetic whitlow are already infected with HSV. The virus spreads to the fingers or hand after they come in contact with saliva or herpes sores. What are the causes? This condition is caused by herpes simplex virus type 1 or type 2 (HSV-1 or HSV-2). You can get herpetic whitlow if your fingers or hand have contact with body fluids that are infected with either of these viruses. Body fluids include:  Discharge from a herpes sore.  Saliva.  Semen.  Vaginal fluid.  Vomit.  Urine.  Stool (feces). What increases the risk? This condition is more likely to develop in:  People who are already infected with HSV.  People who work in the health care industry, particularly in dentistry. Health care workers can get herpetic whitlow from touching the mouths of patients who have oral herpes.  Young children. This is because they often put their fingers in their mouths.  Athletes who participate in contact sports, such as wrestling. What are the signs or symptoms? Symptoms of this condition usually develop 2-14 days after exposure to the virus. Symptoms may include:  Pain, burning, or tingling in the affected area.  Fever.  Redness and swelling in the affected area.  Small fluid-filled bumps around the infected area. These bumps may blister and will develop into sores. The blisters and sores may break open.  Swollen lymph nodes. Symptoms usually improve 7-10 days  after they appear and can clear up within 3-4 weeks. How is this diagnosed? This condition may be diagnosed based on a physical exam and your medical history. You may also have tests, including:  Tests that examine a sample of fluid from the infected area. These tests check the fluid for signs of HSV.  Blood tests. How is this treated? Treatment is not needed for this condition because symptoms usually get better on their own. However, your health care provider may recommend taking antiviral medicines to relieve symptoms and to prevent the disease from spreading. Antiviral medicines can be taken through an IV, by mouth (orally), or through a skin (topical) ointment. It is important to remember that the herpes virus cannot be completely eliminated from your body, but treatment can help to prevent future outbreaks. Follow these instructions at home: Prevent the spread of infection The infection can spread to other people. It can also spread to other areas of your body, especially to your mouth and your genital area. To keep it from spreading:  Cover the affected area with a bandage (dressing). Keep the dressing dry.  If you work in the health care or Designer, industrial/product, wear gloves.  Avoid close contact with other people until your sores heal and your symptoms are gone.  Do not share towels and washcloths.  Do not touch the bumps with your other hand or pick at your scabs.  Wash your hands often using soap and water. If soap and water are not available, use hand sanitizer.  Do not touch your eyes, mouth, or genital area unless you wash your hands first.  Managing pain and swelling Put ice on the affected area. To do this:  Put ice in a plastic bag.  Place a towel between your skin and the bag.  Leave the ice on for 20 minutes, 2-3 times per day.  General instructions  Take or apply over-the-counter and prescription medicines only as told by your health care provider.  Keep all  follow-up visits as told by your health care provider. This is important. How is this prevented? To prevent this condition:  If you work in the health care or dentistry field, wear gloves when working with patients.  If you are at increased risk for infection, wash your hands often with soap and warm water. If soap and water are not available, use hand sanitizer.  Use latex condoms every time you have sex.  Avoid putting fingers in your mouth unless you have cleaned them with soap and warm water. Contact a health care provider if:  The infection spreads to another area of your body.  Your symptoms get worse.  The infection returns. Summary  Herpetic whitlow is an infection that affects the skin of the fingers or hand.  This condition is caused by herpes simplex virus type 1 or type 2 (HSV-1 or HSV-2). You can get herpetic whitlow if your fingers or hand have contact with body fluids that are infected with either of these viruses.  Treatment is usually not needed for this condition because symptoms usually resolve on their own. However, your health care provider may recommend taking or applying antiviral medicines.  The infection can spread to other people. It can also spread to other areas of your body, especially to your mouth and your genital area.  The herpes virus cannot be completely eliminated from your body, but treatment can help to prevent future outbreaks. This information is not intended to replace advice given to you by your health care provider. Make sure you discuss any questions you have with your health care provider. Document Revised: 02/07/2019 Document Reviewed: 02/07/2019 Elsevier Patient Education  [place].

## 2020-09-25 ENCOUNTER — Encounter: Payer: Self-pay | Admitting: Family Medicine

## 2020-09-25 NOTE — Progress Notes (Signed)
Name: Carrie Cross   MRN: [id number]    DOB: Mar 12, 1976   Date:09/25/2020       Progress Note  Subjective:    Chief Complaint  Chief Complaint  Patient presents with  . Joint Pain    Pain spreading on right hand, cramping    I connected with  Cire N Golomb  on 09/25/20 at  9:40 AM EST by a video enabled telemedicine application and verified that I am speaking with the correct person using two identifiers.  I discussed the limitations of evaluation and management by telemedicine and the availability of in person appointments. The patient expressed understanding and agreed to proceed. Staff also discussed with the patient that there may be a patient responsible charge related to this service. Patient Location: home Provider Location: [facility] clinic Additional Individuals present: none  HPI Hand and finger pain - mostly to right hand and fingers 2-5 Worse with cold weather and worse at night Right handed -  Seems to be in fingers, PIP to end of fingers, some associated swelling, stiffness, ache, tingling/numbness, cramping, sometimes both hands/fingers  No hx of arthritic conditions, autoimmune diseases, gout, past injury to right hand/fingers  Patient Active Problem List   Diagnosis Date Noted  . Muscle cramps 02/18/2020  . Tingling 02/18/2020  . IUD (intrauterine device) in place 01/27/2020  . Difficulty sleeping 01/08/2020  . Insomnia 11/18/2019  . Prediabetes 04/25/2019  . Class 1 obesity with serious comorbidity and body mass index (BMI) of 30.0 to 30.9 in adult 04/25/2019  . Herpes simplex 08/10/2015  . Eczema 05/11/2015  . Hypertension   . HLD (hyperlipidemia)   . GERD (gastroesophageal reflux disease)   . Allergic rhinitis   . Chronic migraine     Social History   Tobacco Use  . Smoking status: Former Smoker    Packs/day: 0.50    Years: 7.00    Pack years: 3.50    Types: Cigarettes    Quit date: 06/02/2011    Years since quitting: 9.3  . Smokeless  tobacco: Never Used  Substance Use Topics  . Alcohol use: Yes    Alcohol/week: 3.0 standard drinks    Types: 3 Glasses of wine per week     Current Outpatient Medications:  .  amLODipine (NORVASC) 10 MG tablet, TAKE 1 TABLET(10 MG) BY MOUTH DAILY, Disp: 90 tablet, Rfl: 3 .  atorvastatin (LIPITOR) 20 MG tablet, TAKE 1 TABLET AT BEDTIME, Disp: 90 tablet, Rfl: 3 .  clobetasol ointment (TEMOVATE) 6.07 %, Apply 1 application topically 2 (two) times daily. Apply to affected area, Disp: 30 g, Rfl: 2 .  fexofenadine (ALLEGRA) 180 MG tablet, TAKE 1 TABLET BY MOUTH EVERY DAY FOR ALLERGIES OR SINUS, Disp: 90 tablet, Rfl: 3 .  fluticasone (FLONASE) 50 MCG/ACT nasal spray, USE 2 SPRAYS IN EACH       NOSTRIL DAILY., Disp: 48 g, Rfl: 1 .  ipratropium (ATROVENT) 0.03 % nasal spray, Place 2 sprays into both nostrils 3 (three) times daily. (Patient not taking: Reported on 01/27/2020), Disp: 30 mL, Rfl: 12 .  levonorgestrel (MIRENA) 20 MCG/24HR IUD, by Intrauterine route. Reported on 12/13/2015, Disp: , Rfl:  .  metoprolol succinate (TOPROL-XL) 100 MG 24 hr tablet, TAKE 1 TABLET DAILY WITH ORIMMEDIATELY FOLLOWING A    MEAL, Disp: 90 tablet, Rfl: 3 .  omeprazole (PRILOSEC) 20 MG capsule, Take 1 capsule (20 mg total) by mouth daily before breakfast., Disp: 90 capsule, Rfl: 3 .  pregabalin (LYRICA) 25 MG  capsule, Take 1 capsule (25 mg total) by mouth 2 (two) times daily., Disp: 180 capsule, Rfl: 1 .  valACYclovir (VALTREX) 1000 MG tablet, Take 1 tablet (1,000 mg total) by mouth 2 (two) times daily., Disp: 20 tablet, Rfl: 0  No Known Allergies  I personally reviewed active problem list, medication list, allergies, family history, social history, health maintenance, notes from last encounter, lab results, imaging with the patient/caregiver today.   Review of Systems  Constitutional: Negative.  Negative for activity change, appetite change, chills, diaphoresis, fatigue, fever and unexpected weight change.  HENT:  Negative.   Eyes: Negative.   Respiratory: Negative.   Cardiovascular: Negative.   Gastrointestinal: Negative.   Endocrine: Negative.  Negative for cold intolerance.  Genitourinary: Negative.   Musculoskeletal: Negative.   Skin: Negative.  Negative for color change, pallor and rash.  Allergic/Immunologic: Negative.  Negative for immunocompromised state.  Neurological: Negative.  Negative for tremors and weakness.  Hematological: Negative.   Psychiatric/Behavioral: Negative.   All other systems reviewed and are negative.  (neg other than otherwise noted in HPI)   Objective:   Virtual encounter, vitals limited, only able to obtain the following There were no vitals filed for this visit. There is no height or weight on file to calculate BMI. Nursing Note and Vital Signs reviewed.  Physical Exam Vitals and nursing note reviewed.  Constitutional:      Appearance: Normal appearance.  Musculoskeletal:     Comments: Poor resolution on video call - not able to see detail of right hand and finger, no noted erythema or pallor, appears to have grossly normal ROM of right hand and wrist, could not visualize PIP and DIP joints for eval of swelling/ROM  Skin:    Findings: No erythema.  Neurological:     Mental Status: She is alert.  Psychiatric:        Mood and Affect: Mood normal.        Behavior: Behavior normal.     PE limited by telephone encounter  No results found for this or any previous visit (from the past 72 hour(s)).  Assessment and Plan:     ICD-10-CM   1. Pain in right finger(s)  M79.644 TSH    CBC with Differential/Platelet    Sedimentation rate    Rheumatoid factor    ANA    C-reactive protein    DG Hand Complete Right    meloxicam (MOBIC) 15 MG tablet   Screening for arthritic conditions, autoimmune, may also be carpal tunnel - discussed options to see hand specialists, do labs and refer to rheumatology if labs positive - pt did prefer to do work up and she  declined referral at this time.  no family hx of autoimmune disease No past injury - Xray for screening to eval joints  Labs - reviewed hours and how to do labs in clinic Xray ordered - explained she will need to walk in for Xray - no appt needed - CMA asked to call her later and review with her.  Offered ortho/hand specialists referral - standing offer if pt wants  Labs - would refer to rheum if joint spaces indicate something like RA or if labs are positive - seems less likely due to it being unilateral and may be more arthritic/overuse/carpal tunnel with dominant hand?  - I discussed the assessment and treatment plan with the patient. The patient was provided an opportunity to ask questions and all were answered. The patient agreed with the plan and demonstrated  an understanding of the instructions.  I provided 20+ minutes of non-face-to-face time during this encounter.  Delsa Grana, PA-C 09/25/20 7:32 PM

## 2020-09-26 ENCOUNTER — Other Ambulatory Visit: Payer: Self-pay

## 2020-09-26 ENCOUNTER — Telehealth (INDEPENDENT_AMBULATORY_CARE_PROVIDER_SITE_OTHER): Payer: BC Managed Care – PPO | Admitting: Family Medicine

## 2020-09-26 ENCOUNTER — Encounter: Payer: Self-pay | Admitting: Family Medicine

## 2020-09-26 DIAGNOSIS — M25549 Pain in joints of unspecified hand: Secondary | ICD-10-CM | POA: Diagnosis not present

## 2020-09-26 DIAGNOSIS — R768 Other specified abnormal immunological findings in serum: Secondary | ICD-10-CM

## 2020-09-26 DIAGNOSIS — M79644 Pain in right finger(s): Secondary | ICD-10-CM | POA: Diagnosis not present

## 2020-09-26 MED ORDER — MELOXICAM 15 MG PO TABS: 15.0000 mg | ORAL_TABLET | Freq: Every day | ORAL | 0 refills | Status: DC | PRN

## 2020-09-28 LAB — CBC WITH DIFFERENTIAL/PLATELET
Absolute Monocytes: 558 cells/uL (ref 200–950)
Basophils Absolute: 41 cells/uL (ref 0–200)
Basophils Relative: 0.5 %
Eosinophils Absolute: 107 cells/uL (ref 15–500)
Eosinophils Relative: 1.3 %
HCT: 36.2 % (ref 35.0–45.0)
Hemoglobin: 12.5 g/dL (ref 11.7–15.5)
Lymphs Abs: 2977 cells/uL (ref 850–3900)
MCH: 31.5 pg (ref 27.0–33.0)
MCHC: 34.5 g/dL (ref 32.0–36.0)
MCV: 91.2 fL (ref 80.0–100.0)
MPV: 9 fL (ref 7.5–12.5)
Monocytes Relative: 6.8 %
Neutro Abs: 4518 cells/uL (ref 1500–7800)
Neutrophils Relative %: 55.1 %
Platelets: 447 10*3/uL — ABNORMAL HIGH (ref 140–400)
RBC: 3.97 10*6/uL (ref 3.80–5.10)
RDW: 12.6 % (ref 11.0–15.0)
Total Lymphocyte: 36.3 %
WBC: 8.2 10*3/uL (ref 3.8–10.8)

## 2020-09-28 LAB — TSH: TSH: 0.64 mIU/L

## 2020-09-28 LAB — C-REACTIVE PROTEIN: CRP: 4.7 mg/L (ref <8.0)

## 2020-09-28 LAB — RHEUMATOID FACTOR: Rheumatoid fact SerPl-aCnc: <14 IU/mL (ref <14)

## 2020-09-28 LAB — ANTI-NUCLEAR AB-TITER (ANA TITER): ANA Titer 1: 1:40 {titer} — ABNORMAL HIGH

## 2020-09-28 LAB — SEDIMENTATION RATE: Sed Rate: 9 mm/h (ref 0–20)

## 2020-09-28 LAB — ANA: Anti Nuclear Antibody (ANA): POSITIVE — AB

## 2020-10-01 ENCOUNTER — Encounter: Payer: Self-pay | Admitting: Family Medicine

## 2020-10-01 MED ORDER — PREDNISONE 20 MG PO TABS: ORAL_TABLET | ORAL | 0 refills | Status: DC

## 2020-10-01 NOTE — Addendum Note (Signed)
Addended by: Delsa Grana on: 10/01/2020 12:45 PM   Modules accepted: Orders

## 2020-10-11 ENCOUNTER — Encounter: Payer: Self-pay | Admitting: Family Medicine

## 2020-10-18 ENCOUNTER — Other Ambulatory Visit: Payer: Self-pay | Admitting: Family Medicine

## 2020-10-18 DIAGNOSIS — E782 Mixed hyperlipidemia: Secondary | ICD-10-CM

## 2020-10-22 ENCOUNTER — Other Ambulatory Visit: Payer: Self-pay

## 2020-10-22 DIAGNOSIS — M79644 Pain in right finger(s): Secondary | ICD-10-CM

## 2020-10-22 MED ORDER — MELOXICAM 15 MG PO TABS: 15.0000 mg | ORAL_TABLET | Freq: Every day | ORAL | 0 refills | Status: DC | PRN

## 2020-10-25 ENCOUNTER — Other Ambulatory Visit: Payer: Self-pay | Admitting: Family Medicine

## 2020-10-25 DIAGNOSIS — G4700 Insomnia, unspecified: Secondary | ICD-10-CM

## 2020-11-19 ENCOUNTER — Telehealth: Payer: BC Managed Care – PPO | Admitting: Family Medicine

## 2020-11-20 ENCOUNTER — Other Ambulatory Visit: Payer: Self-pay | Admitting: Family Medicine

## 2020-11-20 DIAGNOSIS — M79644 Pain in right finger(s): Secondary | ICD-10-CM

## 2020-12-17 ENCOUNTER — Other Ambulatory Visit: Payer: Self-pay | Admitting: Family Medicine

## 2020-12-17 DIAGNOSIS — I10 Essential (primary) hypertension: Secondary | ICD-10-CM

## 2021-01-04 ENCOUNTER — Telehealth: Payer: BC Managed Care – PPO | Admitting: Physician Assistant

## 2021-01-04 ENCOUNTER — Encounter: Payer: Self-pay | Admitting: Family Medicine

## 2021-01-04 DIAGNOSIS — J0110 Acute frontal sinusitis, unspecified: Secondary | ICD-10-CM

## 2021-01-04 MED ORDER — AMOXICILLIN-POT CLAVULANATE 875-125 MG PO TABS: 1.0000 | ORAL_TABLET | Freq: Two times a day (BID) | ORAL | 0 refills | Status: DC

## 2021-01-04 NOTE — Progress Notes (Signed)
I have spent 5 minutes in review of e-visit questionnaire, review and updating patient chart, medical decision making and response to patient.   Odeal Welden Cody Gurnie Duris, PA-C    

## 2021-01-04 NOTE — Progress Notes (Signed)

## 2021-01-31 ENCOUNTER — Ambulatory Visit (INDEPENDENT_AMBULATORY_CARE_PROVIDER_SITE_OTHER): Payer: BC Managed Care – PPO | Admitting: Certified Nurse Midwife

## 2021-01-31 ENCOUNTER — Other Ambulatory Visit (HOSPITAL_COMMUNITY)
Admission: RE | Admit: 2021-01-31 | Discharge: 2021-01-31 | Disposition: A | Discharge status: Home / Self Care | Payer: BC Managed Care – PPO | Source: Ambulatory Visit | Attending: Certified Nurse Midwife | Admitting: Certified Nurse Midwife

## 2021-01-31 ENCOUNTER — Other Ambulatory Visit: Payer: Self-pay

## 2021-01-31 VITALS — BP 145/83 | HR 86 | Resp 16 | Ht 64.0 in | Wt 166.6 lb

## 2021-01-31 DIAGNOSIS — Z124 Encounter for screening for malignant neoplasm of cervix: Secondary | ICD-10-CM | POA: Insufficient documentation

## 2021-01-31 DIAGNOSIS — Z01419 Encounter for gynecological examination (general) (routine) without abnormal findings: Secondary | ICD-10-CM

## 2021-01-31 DIAGNOSIS — E663 Overweight: Secondary | ICD-10-CM

## 2021-01-31 DIAGNOSIS — I10 Essential (primary) hypertension: Secondary | ICD-10-CM | POA: Diagnosis not present

## 2021-01-31 DIAGNOSIS — Z1159 Encounter for screening for other viral diseases: Secondary | ICD-10-CM

## 2021-01-31 DIAGNOSIS — Z975 Presence of (intrauterine) contraceptive device: Secondary | ICD-10-CM

## 2021-01-31 NOTE — Patient Instructions (Signed)
Preventive Care 45-45 Years Old, Female Preventive care refers to lifestyle choices and visits with your health care provider that can promote health and wellness. This includes:  A yearly physical exam. This is also called an annual wellness visit.  Regular dental and eye exams.  Immunizations.  Screening for certain conditions.  Healthy lifestyle choices, such as: ? Eating a healthy diet. ? Getting regular exercise. ? Not using drugs or products that contain nicotine and tobacco. ? Limiting alcohol use. What can I expect for my preventive care visit? Physical exam Your health care provider will check your:  Height and weight. These may be used to calculate your BMI (body mass index). BMI is a measurement that tells if you are at a healthy weight.  Heart rate and blood pressure.  Body temperature.  Skin for abnormal spots. Counseling Your health care provider may ask you questions about your:  Past medical problems.  Family's medical history.  Alcohol, tobacco, and drug use.  Emotional well-being.  Home life and relationship well-being.  Sexual activity.  Diet, exercise, and sleep habits.  Work and work Statistician.  Access to firearms.  Method of birth control.  Menstrual cycle.  Pregnancy history. What immunizations do I need? Vaccines are usually given at various ages, according to a schedule. Your health care provider will recommend vaccines for you based on your age, medical history, and lifestyle or other factors, such as travel or where you work.   What tests do I need? Blood tests  Lipid and cholesterol levels. These may be checked every 5 years, or more often if you are over 3 years old.  Hepatitis C test.  Hepatitis B test. Screening  Lung cancer screening. You may have this screening every year starting at age 45 if you have a 30-pack-year history of smoking and currently smoke or have quit within the past 15 years.  Colorectal cancer  screening. ? All adults should have this screening starting at age 45 and continuing until age 17. ? Your health care provider may recommend screening at age 45 if you are at increased risk. ? You will have tests every 1-10 years, depending on your results and the type of screening test.  Diabetes screening. ? This is done by checking your blood sugar (glucose) after you have not eaten for a while (fasting). ? You may have this done every 1-3 years.  Mammogram. ? This may be done every 1-2 years. ? Talk with your health care provider about when you should start having regular mammograms. This may depend on whether you have a family history of breast cancer.  BRCA-related cancer screening. This may be done if you have a family history of breast, ovarian, tubal, or peritoneal cancers.  Pelvic exam and Pap test. ? This may be done every 3 years starting at age 10. ? Starting at age 11, this may be done every 5 years if you have a Pap test in combination with an HPV test. Other tests  STD (sexually transmitted disease) testing, if you are at risk.  Bone density scan. This is done to screen for osteoporosis. You may have this scan if you are at high risk for osteoporosis. Talk with your health care provider about your test results, treatment options, and if necessary, the need for more tests. Follow these instructions at home: Eating and drinking  Eat a diet that includes fresh fruits and vegetables, whole grains, lean protein, and low-fat dairy products.  Take vitamin and mineral supplements  as recommended by your health care provider.  Do not drink alcohol if: ? Your health care provider tells you not to drink. ? You are pregnant, may be pregnant, or are planning to become pregnant.  If you drink alcohol: ? Limit how much you have to 0-1 drink a day. ? Be aware of how much alcohol is in your drink. In the [place] one drink equals one 12 oz bottle of beer (355 mL), one 5 oz glass of  wine (148 mL), or one 1 oz glass of hard liquor (44 mL).   Lifestyle  Take daily care of your teeth and gums. Brush your teeth every morning and night with fluoride toothpaste. Floss one time each day.  Stay active. Exercise for at least 30 minutes 5 or more days each week.  Do not use any products that contain nicotine or tobacco, such as cigarettes, e-cigarettes, and chewing tobacco. If you need help quitting, ask your health care provider.  Do not use drugs.  If you are sexually active, practice safe sex. Use a condom or other form of protection to prevent STIs (sexually transmitted infections).  If you do not wish to become pregnant, use a form of birth control. If you plan to become pregnant, see your health care provider for a prepregnancy visit.  If told by your health care provider, take low-dose aspirin daily starting at age 45.  Find healthy ways to cope with stress, such as: ? Meditation, yoga, or listening to music. ? Journaling. ? Talking to a trusted person. ? Spending time with friends and family. Safety  Always wear your seat belt while driving or riding in a vehicle.  Do not drive: ? If you have been drinking alcohol. Do not ride with someone who has been drinking. ? When you are tired or distracted. ? While texting.  Wear a helmet and other protective equipment during sports activities.  If you have firearms in your house, make sure you follow all gun safety procedures. What's next?  Visit your health care provider once a year for an annual wellness visit.  Ask your health care provider how often you should have your eyes and teeth checked.  Stay up to date on all vaccines. This information is not intended to replace advice given to you by your health care provider. Make sure you discuss any questions you have with your health care provider. Document Revised: 05/22/2020 Document Reviewed: 04/29/2018 Elsevier Patient Education  2021 Elsevier Inc.  

## 2021-01-31 NOTE — Progress Notes (Signed)
ANNUAL PREVENTATIVE CARE GYN  ENCOUNTER NOTE  Subjective:       Carrie Cross is a 45 y.o. G58P0101 female here for a routine annual gynecologic exam.  Current complaints: 1. Requests Pap and labs  Denies difficulty breathing or respiratory distress, chest pain, abdominal pain, excessive vaginal bleeding, dysuria, and leg pain or swelling.    Gynecologic History  No LMP recorded. (Menstrual status: IUD).  Contraception: IUD, Mirena  Last Pap: 09/2017. Results were: Neg/Neg  Last mammogram: 07/2020. Results were: BI-RADS 1  Obstetric History  OB History  Gravida Para Term Preterm AB Living  1 1   1   1   SAB IAB Ectopic Multiple Live Births          1    # Outcome Date GA Lbr Len/2nd Weight Sex Delivery Anes PTL Lv  1 Preterm 12/06/98   3 lb (1.361 kg) F Vag-Spont   LIV    Past Medical History:  Diagnosis Date  . Allergic rhinitis   . Carpal tunnel syndrome   . Chronic migraine   . GERD (gastroesophageal reflux disease)   . Hx of pyelonephritis    as a child  . Hyperlipidemia   . Hypertension    Controlled per pt  . IFG (impaired fasting glucose)   . Irregular menstruation     Past Surgical History:  Procedure Laterality Date  . BACK SURGERY  [id number]   disc shaved down  . CERVICAL DISC ARTHROPLASTY  [id number]    Current Outpatient Medications on File Prior to Visit  Medication Sig Dispense Refill  . amLODipine (NORVASC) 10 MG tablet TAKE 1 TABLET(10 MG) BY MOUTH DAILY 90 tablet 3  . atorvastatin (LIPITOR) 20 MG tablet TAKE 1 TABLET AT BEDTIME 90 tablet 1  . clobetasol ointment (TEMOVATE) 7.78 % Apply 1 application topically 2 (two) times daily. Apply to affected area 30 g 2  . fexofenadine (ALLEGRA) 180 MG tablet TAKE 1 TABLET BY MOUTH EVERY DAY FOR ALLERGIES OR SINUS 90 tablet 3  . fluticasone (FLONASE) 50 MCG/ACT nasal spray USE 2 SPRAYS IN EACH       NOSTRIL DAILY. 48 g 1  . ipratropium (ATROVENT) 0.03 % nasal spray Place 2 sprays into both nostrils  3 (three) times daily. 30 mL 12  . levonorgestrel (MIRENA) 20 MCG/24HR IUD by Intrauterine route. Reported on 12/13/2015    . metoprolol succinate (TOPROL-XL) 100 MG 24 hr tablet TAKE 1 TABLET DAILY WITH ORIMMEDIATELY FOLLOWING A    MEAL 90 tablet 3  . omeprazole (PRILOSEC) 20 MG capsule Take 1 capsule (20 mg total) by mouth daily before breakfast. 90 capsule 3   No current facility-administered medications on file prior to visit.    No Known Allergies  Social History   Socioeconomic History  . Marital status: Married    Spouse name: Not on file  . Number of children: Not on file  . Years of education: Not on file  . Highest education level: Not on file  Occupational History  . Not on file  Tobacco Use  . Smoking status: Former Smoker    Packs/day: 0.50    Years: 7.00    Pack years: 3.50    Types: Cigarettes    Quit date: 06/02/2011    Years since quitting: 9.6  . Smokeless tobacco: Never Used  Vaping Use  . Vaping Use: Never used  Substance and Sexual Activity  . Alcohol use: Yes    Alcohol/week: 3.0 standard drinks  Types: 3 Glasses of wine per week  . Drug use: No  . Sexual activity: Yes    Birth control/protection: I.U.D.    Comment: mirena  Other Topics Concern  . Not on file  Social History Narrative  . Not on file   Social Determinants of Health   Financial Resource Strain: Not on file  Food Insecurity: Not on file  Transportation Needs: Not on file  Physical Activity: Not on file  Stress: Not on file  Social Connections: Not on file  Intimate Partner Violence: Not on file    Family History  Problem Relation Age of Onset  . Breast cancer Maternal Grandmother 80  . Heart disease Maternal Grandfather   . Stroke Neg Hx   . Diabetes Neg Hx   . Ovarian cancer Neg Hx   . Colon cancer Neg Hx     The following portions of the patient's history were reviewed and updated as appropriate: allergies, current medications, past family history, past medical  history, past social history, past surgical history and problem list.  Review of Systems  ROS negative except as noted above. Information obtained from patient.    Objective:   BP (!) 145/83   Pulse 86   Resp 16   Ht 5\' 4"  (1.626 m)   Wt 166 lb 9.6 oz (75.6 kg)   BMI 28.60 kg/m    CONSTITUTIONAL: Well-developed, well-nourished female in no acute distress.   PSYCHIATRIC: Normal mood and affect. Normal behavior. Normal judgment and thought content.  [place]: Alert and oriented to person, place, and time. Normal muscle tone coordination. No cranial  nerve deficit noted.  HENT:  Normocephalic, atraumatic.  EYES: Conjunctivae and EOM are normal.  NECK: Normal range of motion, supple, no masses.  Normal thyroid.   SKIN: Skin is warm and dry. No rash noted. Not diaphoretic. No erythema. No pallor. Professional tattoos present.   CARDIOVASCULAR: Normal heart rate noted, regular rhythm, no murmur.  RESPIRATORY: Clear to auscultation bilaterally. Effort and breath sounds normal, no problems with respiration noted.  BREASTS: Symmetric in size. No masses, skin changes, nipple drainage, or lymphadenopathy.  ABDOMEN: Soft, normal bowel sounds, no distention noted.  No tenderness, rebound or guarding.   PELVIC:  External Genitalia: Normal  Vagina: Normal  Cervix: Normal, IUD strings present, Pap collected  Uterus: Normal  Adnexa: Normal  MUSCULOSKELETAL: Normal range of motion. No tenderness.  No cyanosis, clubbing, or edema.  2+ distal pulses.  LYMPHATIC: No Axillary, Supraclavicular, or Inguinal Adenopathy.  Assessment:   Annual gynecologic examination 45 y.o.   Contraception: IUD   Overweight   Problem List Items Addressed This Visit      Cardiovascular and Mediastinum   Hypertension   Relevant Orders   Comprehensive metabolic panel   Hemoglobin A1c   CBC     Other   IUD (intrauterine device) in place    Other Visit Diagnoses    Well woman exam    -   Primary   Relevant Orders   Comprehensive metabolic panel   Hemoglobin A1c   CBC   Need for hepatitis C screening test       Relevant Orders   Hepatitis C antibody   Cervical cancer screening       Relevant Orders   Cytology - PAP   Overweight (BMI 25.0-29.9)       Relevant Orders   Hemoglobin A1c      Plan:   Pap: Pap Co Test   Mammogram: Up to  date  Labs: See orders   Routine preventative health maintenance measures emphasized: Exercise/Diet/Weight control, Tobacco Warnings, Alcohol/Substance use risks and Stress Management; see AVS  Reviewed red flag symptoms and when to call  Return to [place] for [facility] or sooner if needed   Dani Gobble, CNM  [facility], [facility] 01/31/21 4:06 PM

## 2021-02-01 LAB — COMPREHENSIVE METABOLIC PANEL
ALT: 21 IU/L (ref 0–32)
AST: 18 IU/L (ref 0–40)
Albumin/Globulin Ratio: 1.9 (ref 1.2–2.2)
Albumin: 4.7 g/dL (ref 3.8–4.8)
Alkaline Phosphatase: 107 IU/L (ref 44–121)
BUN/Creatinine Ratio: 14 (ref 9–23)
BUN: 11 mg/dL (ref 6–24)
Bilirubin Total: <0.2 mg/dL (ref 0.0–1.2)
CO2: 22 mmol/L (ref 20–29)
Calcium: 9.6 mg/dL (ref 8.7–10.2)
Chloride: 99 mmol/L (ref 96–106)
Creatinine, Ser: 0.8 mg/dL (ref 0.57–1.00)
Globulin, Total: 2.5 g/dL (ref 1.5–4.5)
Glucose: 122 mg/dL — ABNORMAL HIGH (ref 65–99)
Potassium: 3.6 mmol/L (ref 3.5–5.2)
Sodium: 137 mmol/L (ref 134–144)
Total Protein: 7.2 g/dL (ref 6.0–8.5)
eGFR: 93 mL/min/{1.73_m2} (ref >59)

## 2021-02-01 LAB — CBC
Hematocrit: 38 % (ref 34.0–46.6)
Hemoglobin: 12.8 g/dL (ref 11.1–15.9)
MCH: 31.3 pg (ref 26.6–33.0)
MCHC: 33.7 g/dL (ref 31.5–35.7)
MCV: 93 fL (ref 79–97)
Platelets: 416 10*3/uL (ref 150–450)
RBC: 4.09 x10E6/uL (ref 3.77–5.28)
RDW: 12.9 % (ref 11.7–15.4)
WBC: 9.4 10*3/uL (ref 3.4–10.8)

## 2021-02-01 LAB — HEMOGLOBIN A1C
Est. average glucose Bld gHb Est-mCnc: 140 mg/dL
Hgb A1c MFr Bld: 6.5 % — ABNORMAL HIGH (ref 4.8–5.6)

## 2021-02-01 LAB — HEPATITIS C ANTIBODY: Hep C Virus Ab: <0.1 s/co ratio (ref 0.0–0.9)

## 2021-02-05 LAB — CYTOLOGY - PAP
Adequacy: ABSENT
Comment: NEGATIVE
Diagnosis: NEGATIVE
High risk HPV: NEGATIVE

## 2021-02-07 ENCOUNTER — Other Ambulatory Visit: Payer: Self-pay | Admitting: Certified Nurse Midwife

## 2021-02-07 DIAGNOSIS — B373 Candidiasis of vulva and vagina: Secondary | ICD-10-CM

## 2021-02-07 DIAGNOSIS — B3731 Acute candidiasis of vulva and vagina: Secondary | ICD-10-CM

## 2021-02-07 MED ORDER — FLUCONAZOLE 150 MG PO TABS
150.0000 mg | ORAL_TABLET | ORAL | 0 refills | Status: DC
Start: 2021-02-07 — End: 2021-05-02

## 2021-02-07 NOTE — Progress Notes (Signed)
Rx Diflucan, see orders.    Dani Gobble, CNM [facility], [facility] 02/07/21 3:18 PM

## 2021-02-13 ENCOUNTER — Other Ambulatory Visit: Payer: Self-pay

## 2021-02-13 DIAGNOSIS — I10 Essential (primary) hypertension: Secondary | ICD-10-CM

## 2021-02-13 MED ORDER — METOPROLOL SUCCINATE ER 100 MG PO TB24: ORAL_TABLET | ORAL | 1 refills | Status: DC

## 2021-02-13 NOTE — Telephone Encounter (Signed)
Copied from [place] [phone]. Topic: General - Other >> Feb 13, 2021 11:19 AM Pawlus, Brayton Layman A wrote: Reason for CRM: Pharmacy was calling to follow up on a refill request they sent over for metoprolol succinate (TOPROL-XL) 100 MG 24 hr tablet. Please advise.   Ref # [id number]

## 2021-02-14 ENCOUNTER — Other Ambulatory Visit: Payer: Self-pay | Admitting: Family Medicine

## 2021-02-14 DIAGNOSIS — I10 Essential (primary) hypertension: Secondary | ICD-10-CM

## 2021-02-14 NOTE — Telephone Encounter (Signed)
Copied from [place] [id number]. Topic: Quick Communication - Rx Refill/Question >> Feb 14, 2021 10:30 AM Tessa Lerner A wrote: Medication: metoprolol succinate (TOPROL-XL) 100 MG 24 hr tablet   Has the patient contacted their pharmacy? Yes.   (Agent: If no, request that the patient contact the pharmacy for the refill.) (Agent: If yes, when and what did the pharmacy advise?)  Preferred Pharmacy (with phone number or street name): [facility] [place] [address] [place], [place] to [facility] [place] [facility]  Phone:  [phone] Fax:  [phone]  Agent: Please be advised that RX refills may take up to 3 business days. We ask that you follow-up with your pharmacy.

## 2021-02-14 NOTE — Telephone Encounter (Signed)
}    Notes to clinic: [facility] is [facility] day [facility]    Requested Prescriptions  Pending Prescriptions Disp Refills   metoprolol succinate (TOPROL-XL) 100 MG 24 hr tablet 30 tablet 1    Sig: TAKE 1 TABLET DAILY WITH ORIMMEDIATELY FOLLOWING A    MEAL      Cardiovascular:  Beta Blockers Failed - 02/14/2021 10:38 AM      Failed - Last BP in normal range    BP Readings from Last 1 Encounters:  01/31/21 (!) 145/83          Passed - Last Heart Rate in normal range    Pulse Readings from Last 1 Encounters:  01/31/21 86          Passed - Valid encounter within last 6 months    Recent Outpatient Visits           4 months ago Pain in right finger(s)   [facility] Delsa Grana, PA-C   6 months ago Pain of middle finger   [facility] Delsa Grana, PA-C   8 months ago Essential hypertension   [facility] Delsa Grana, PA-C   1 year ago Insomnia, unspecified type   [facility] Delsa Grana, PA-C   1 year ago Adult general medical exam   [facility] Delsa Grana, [place]

## 2021-02-14 NOTE — Telephone Encounter (Signed)
Last seen 1.26.2022 no upcoming appt

## 2021-02-18 ENCOUNTER — Other Ambulatory Visit: Payer: Self-pay | Admitting: Family Medicine

## 2021-04-12 ENCOUNTER — Other Ambulatory Visit: Payer: Self-pay | Admitting: Family Medicine

## 2021-04-12 DIAGNOSIS — E782 Mixed hyperlipidemia: Secondary | ICD-10-CM

## 2021-04-19 ENCOUNTER — Telehealth: Payer: Self-pay

## 2021-04-19 MED ORDER — AMLODIPINE BESYLATE 10 MG PO TABS: ORAL_TABLET | ORAL | 0 refills | Status: DC

## 2021-04-19 NOTE — Telephone Encounter (Signed)
Needs appt by sept for any more refills

## 2021-04-22 NOTE — Telephone Encounter (Signed)
Lvm for pt to call the office to schedule an appt  °

## 2021-05-02 ENCOUNTER — Ambulatory Visit: Payer: BC Managed Care – PPO | Admitting: Unknown Physician Specialty

## 2021-05-02 ENCOUNTER — Other Ambulatory Visit: Payer: Self-pay

## 2021-05-02 VITALS — BP 122/76 | HR 81 | Temp 98.1°F | Ht 64.0 in | Wt 167.1 lb

## 2021-05-02 DIAGNOSIS — E119 Type 2 diabetes mellitus without complications: Secondary | ICD-10-CM | POA: Diagnosis not present

## 2021-05-02 DIAGNOSIS — I10 Essential (primary) hypertension: Secondary | ICD-10-CM

## 2021-05-02 DIAGNOSIS — K219 Gastro-esophageal reflux disease without esophagitis: Secondary | ICD-10-CM

## 2021-05-02 DIAGNOSIS — E782 Mixed hyperlipidemia: Secondary | ICD-10-CM

## 2021-05-02 DIAGNOSIS — Z23 Encounter for immunization: Secondary | ICD-10-CM | POA: Diagnosis not present

## 2021-05-02 DIAGNOSIS — M1990 Unspecified osteoarthritis, unspecified site: Secondary | ICD-10-CM

## 2021-05-02 DIAGNOSIS — M1380 Other specified arthritis, unspecified site: Secondary | ICD-10-CM | POA: Insufficient documentation

## 2021-05-02 MED ORDER — METOPROLOL SUCCINATE ER 100 MG PO TB24: ORAL_TABLET | ORAL | 1 refills | Status: DC

## 2021-05-02 MED ORDER — AMLODIPINE BESYLATE 10 MG PO TABS: ORAL_TABLET | ORAL | 0 refills | Status: DC

## 2021-05-02 MED ORDER — OMEPRAZOLE 20 MG PO CPDR: 20.0000 mg | DELAYED_RELEASE_CAPSULE | Freq: Every day | ORAL | 3 refills | Status: DC

## 2021-05-02 NOTE — Assessment & Plan Note (Signed)
Reviewed notes form [place].  Takes [place]

## 2021-05-02 NOTE — Assessment & Plan Note (Signed)
Check lipid panel today 

## 2021-05-02 NOTE — Progress Notes (Signed)
BP 122/76   Pulse 81   Temp 98.1 F (36.7 C)   Ht 5' 4"  (1.626 m)   Wt 167 lb 1.6 oz (75.8 kg)   SpO2 98%   BMI 28.68 kg/m    Subjective:    Patient ID: Carrie Cross, female    DOB: [birth year], 45 y.o.   MRN: [id number]  HPI: Carrie Cross is a 45 y.o. female  Chief Complaint  Patient presents with   Follow-up    Medication refills   Hypertension   Hyperlipidemia   Hypertension Using medications without difficulty Average home BPs   No problems or lightheadedness No chest pain with exertion or shortness of breath No Edema   Hyperlipidemia Using medications without problems: No Muscle aches  Diet compliance: Exercise:  GERD Controlled with daily Omeprazole .  Has tried stopping but unable  Diabetes Last hgb A1C was 6.5.  She has had pre-diabetes for a period of time.  She has been walking more and see if it's better.     Relevant past medical, surgical, family and social history reviewed and updated as indicated. Interim medical history since our last visit reviewed. Allergies and medications reviewed and updated.  Review of Systems  Per HPI unless specifically indicated above     Objective:    BP 122/76   Pulse 81   Temp 98.1 F (36.7 C)   Ht 5' 4"  (1.626 m)   Wt 167 lb 1.6 oz (75.8 kg)   SpO2 98%   BMI 28.68 kg/m   Wt Readings from Last 3 Encounters:  05/02/21 167 lb 1.6 oz (75.8 kg)  01/31/21 166 lb 9.6 oz (75.6 kg)  08/07/20 167 lb 14.4 oz (76.2 kg)    Physical Exam Constitutional:      General: She is not in acute distress.    Appearance: Normal appearance. She is well-developed.  HENT:     Head: Normocephalic and atraumatic.  Eyes:     General: Lids are normal. No scleral icterus.       Right eye: No discharge.        Left eye: No discharge.     Conjunctiva/sclera: Conjunctivae normal.  Neck:     Vascular: No carotid bruit or JVD.  Cardiovascular:     Rate and Rhythm: Normal rate and regular rhythm.     Heart  sounds: Normal heart sounds.  Pulmonary:     Effort: Pulmonary effort is normal. No respiratory distress.     Breath sounds: Normal breath sounds.  Abdominal:     Palpations: There is no hepatomegaly or splenomegaly.  Musculoskeletal:        General: Normal range of motion.     Cervical back: Normal range of motion and neck supple.  Skin:    General: Skin is warm and dry.     Coloration: Skin is not pale.     Findings: No rash.  Neurological:     Mental Status: She is alert and oriented to person, place, and time.  Psychiatric:        Behavior: Behavior normal.        Thought Content: Thought content normal.        Judgment: Judgment normal.    Results for orders placed or performed in visit on 01/31/21  Comprehensive metabolic panel  Result Value Ref Range   Glucose 122 (H) 65 - 99 mg/dL   BUN 11 6 - 24 mg/dL   Creatinine, Ser 0.80 0.57 - 1.00 mg/dL  eGFR 93 >59 mL/min/1.73   BUN/Creatinine Ratio 14 9 - 23   Sodium 137 134 - 144 mmol/L   Potassium 3.6 3.5 - 5.2 mmol/L   Chloride 99 96 - 106 mmol/L   CO2 22 20 - 29 mmol/L   Calcium 9.6 8.7 - 10.2 mg/dL   Total Protein 7.2 6.0 - 8.5 g/dL   Albumin 4.7 3.8 - 4.8 g/dL   Globulin, Total 2.5 1.5 - 4.5 g/dL   Albumin/Globulin Ratio 1.9 1.2 - 2.2   Bilirubin Total <0.2 0.0 - 1.2 mg/dL   Alkaline Phosphatase 107 44 - 121 IU/L   AST 18 0 - 40 IU/L   ALT 21 0 - 32 IU/L  Hemoglobin A1c  Result Value Ref Range   Hgb A1c MFr Bld 6.5 (H) 4.8 - 5.6 %   Est. average glucose Bld gHb Est-mCnc 140 mg/dL  CBC  Result Value Ref Range   WBC 9.4 3.4 - 10.8 x10E3/uL   RBC 4.09 3.77 - 5.28 x10E6/uL   Hemoglobin 12.8 11.1 - 15.9 g/dL   Hematocrit 38.0 34.0 - 46.6 %   MCV 93 79 - 97 fL   MCH 31.3 26.6 - 33.0 pg   MCHC 33.7 31.5 - 35.7 g/dL   RDW 12.9 11.7 - 15.4 %   Platelets 416 150 - 450 x10E3/uL  Hepatitis C antibody  Result Value Ref Range   Hep C Virus Ab <0.1 0.0 - 0.9 s/co ratio  Cytology - PAP  Result Value Ref Range    High risk HPV Negative    Adequacy      Satisfactory for evaluation; transformation zone component ABSENT.   Diagnosis      - Negative for intraepithelial lesion or malignancy (NILM)   Microorganisms      Fungal organisms present consistent with Candida spp.   Comment Normal Reference Range HPV - Negative       Assessment & Plan:   Problem List Items Addressed This Visit       Unprioritized   Diabetes ([place])    New dx with review of labs from 3 months ago and Hgb A1C 6.5%.  Will work on diet and exercise.  Not iterested in lifestyle center.        Relevant Orders   Hemoglobin A1c   Microalbumin, urine   Lipid panel   COMPLETE METABOLIC PANEL WITH GFR   GERD (gastroesophageal reflux disease)    Stable, continue present medications.        Relevant Medications   omeprazole (PRILOSEC) 20 MG capsule   HLD (hyperlipidemia)    Check lipid panel today      Relevant Medications   amLODipine (NORVASC) 10 MG tablet   metoprolol succinate (TOPROL-XL) 100 MG 24 hr tablet   Hypertension    Stable, continue present medications. However is not on an ACE or ARB.  Check microalbumin       Relevant Medications   amLODipine (NORVASC) 10 MG tablet   metoprolol succinate (TOPROL-XL) 100 MG 24 hr tablet   Inflammatory arthritis    Reviewed notes form [facility].  Takes Plaquenel      Relevant Medications   hydroxychloroquine (PLAQUENIL) 200 MG tablet   Other Visit Diagnoses     Need for influenza vaccination    -  Primary   Relevant Orders   Flu Vaccine QUAD 6+ mos PF IM (Fluarix Quad PF)   Essential hypertension       Relevant Medications   amLODipine (NORVASC) 10 MG tablet  metoprolol succinate (TOPROL-XL) 100 MG 24 hr tablet        Follow up plan: Return in about 3 months (around 08/01/2021).

## 2021-05-02 NOTE — Assessment & Plan Note (Addendum)
Stable, continue present medications. However is not on an ACE or ARB.  Check microalbumin

## 2021-05-02 NOTE — Assessment & Plan Note (Signed)
New dx with review of labs from 3 months ago and Hgb A1C 6.5%.  Will work on diet and exercise.  Not iterested in lifestyle center.

## 2021-05-02 NOTE — Assessment & Plan Note (Signed)
Stable, continue present medications.   

## 2021-05-03 LAB — LIPID PANEL
Cholesterol: 125 mg/dL (ref <200)
HDL: 57 mg/dL (ref >=50)
LDL Cholesterol (Calc): 56 mg/dL (calc)
Non-HDL Cholesterol (Calc): 68 mg/dL (calc) (ref <130)
Total CHOL/HDL Ratio: 2.2 (calc) (ref <5.0)
Triglycerides: 41 mg/dL (ref <150)

## 2021-05-03 LAB — COMPLETE METABOLIC PANEL WITH GFR
AG Ratio: 2 (calc) (ref 1.0–2.5)
ALT: 17 U/L (ref 6–29)
AST: 12 U/L (ref 10–30)
Albumin: 4.5 g/dL (ref 3.6–5.1)
Alkaline phosphatase (APISO): 75 U/L (ref 31–125)
BUN: 18 mg/dL (ref 7–25)
CO2: 26 mmol/L (ref 20–32)
Calcium: 9.9 mg/dL (ref 8.6–10.2)
Chloride: 103 mmol/L (ref 98–110)
Creat: 0.76 mg/dL (ref 0.50–0.99)
Globulin: 2.3 g/dL (calc) (ref 1.9–3.7)
Glucose, Bld: 98 mg/dL (ref 65–99)
Potassium: 4.2 mmol/L (ref 3.5–5.3)
Sodium: 138 mmol/L (ref 135–146)
Total Bilirubin: 0.4 mg/dL (ref 0.2–1.2)
Total Protein: 6.8 g/dL (ref 6.1–8.1)
eGFR: 99 mL/min/{1.73_m2} (ref >=60)

## 2021-05-03 LAB — MICROALBUMIN, URINE: Microalb, Ur: 1.4 mg/dL

## 2021-05-03 LAB — HEMOGLOBIN A1C
Hgb A1c MFr Bld: 5.9 % of total Hgb — ABNORMAL HIGH (ref <5.7)
Mean Plasma Glucose: 123 mg/dL
eAG (mmol/L): 6.8 mmol/L

## 2021-05-30 ENCOUNTER — Other Ambulatory Visit: Payer: Self-pay | Admitting: Family Medicine

## 2021-05-30 DIAGNOSIS — Z1231 Encounter for screening mammogram for malignant neoplasm of breast: Secondary | ICD-10-CM

## 2021-06-12 ENCOUNTER — Telehealth: Payer: BC Managed Care – PPO | Admitting: Physician Assistant

## 2021-06-12 DIAGNOSIS — J019 Acute sinusitis, unspecified: Secondary | ICD-10-CM

## 2021-06-12 DIAGNOSIS — B9689 Other specified bacterial agents as the cause of diseases classified elsewhere: Secondary | ICD-10-CM

## 2021-06-12 MED ORDER — AMOXICILLIN-POT CLAVULANATE 875-125 MG PO TABS: 1.0000 | ORAL_TABLET | Freq: Two times a day (BID) | ORAL | 0 refills | Status: DC

## 2021-06-12 NOTE — Progress Notes (Signed)

## 2021-06-12 NOTE — Progress Notes (Signed)
I have spent 5 minutes in review of e-visit questionnaire, review and updating patient chart, medical decision making and response to patient.   Ahmaad Neidhardt Cody Masaye Gatchalian, PA-C    

## 2021-06-21 ENCOUNTER — Encounter: Payer: Self-pay | Admitting: Unknown Physician Specialty

## 2021-06-27 MED ORDER — SCOPOLAMINE 1 MG/3DAYS TD PT72: 1.0000 | MEDICATED_PATCH | TRANSDERMAL | 12 refills | Status: DC

## 2021-06-27 MED ORDER — FLUCONAZOLE 150 MG PO TABS: 150.0000 mg | ORAL_TABLET | Freq: Once | ORAL | 0 refills | Status: AC

## 2021-07-15 ENCOUNTER — Ambulatory Visit
Admission: RE | Admit: 2021-07-15 | Discharge: 2021-07-15 | Disposition: A | Discharge status: Home / Self Care | Payer: BC Managed Care – PPO | Source: Ambulatory Visit | Attending: Family Medicine | Admitting: Family Medicine

## 2021-07-15 ENCOUNTER — Other Ambulatory Visit: Payer: Self-pay

## 2021-07-15 DIAGNOSIS — Z1231 Encounter for screening mammogram for malignant neoplasm of breast: Secondary | ICD-10-CM | POA: Insufficient documentation

## 2021-07-18 ENCOUNTER — Other Ambulatory Visit: Payer: Self-pay | Admitting: Family Medicine

## 2021-07-18 NOTE — Telephone Encounter (Signed)
Requested Prescriptions  Pending Prescriptions Disp Refills  . amLODipine (NORVASC) 10 MG tablet [Pharmacy Med Name: AMLODIPINE BESYLATE 10MG  TABLETS] 90 tablet 1    Sig: TAKE 1 TABLET(10 MG) BY MOUTH DAILY     Cardiovascular:  Calcium Channel Blockers Passed - 07/18/2021  6:28 AM      Passed - Last BP in normal range    BP Readings from Last 1 Encounters:  05/02/21 122/76         Passed - Valid encounter within last 6 months    Recent Outpatient Visits          2 months ago Need for influenza vaccination   [facility] Kathrine Haddock, NP   9 months ago Pain in right finger(s)   [facility] Delsa Grana, PA-C   11 months ago Pain of middle finger   [facility] Delsa Grana, PA-C   1 year ago Essential hypertension   [facility] Delsa Grana, PA-C   1 year ago Insomnia, unspecified type   [facility] Delsa Grana, PA-C      Future Appointments            In 3 weeks Delsa Grana, PA-C [facility], [facility]

## 2021-08-02 ENCOUNTER — Telehealth: Payer: BC Managed Care – PPO | Admitting: Nurse Practitioner

## 2021-08-02 DIAGNOSIS — R6889 Other general symptoms and signs: Secondary | ICD-10-CM | POA: Diagnosis not present

## 2021-08-02 MED ORDER — OSELTAMIVIR PHOSPHATE 75 MG PO CAPS: 75.0000 mg | ORAL_CAPSULE | Freq: Two times a day (BID) | ORAL | 0 refills | Status: DC

## 2021-08-02 NOTE — Progress Notes (Signed)

## 2021-08-06 ENCOUNTER — Ambulatory Visit: Payer: BC Managed Care – PPO | Admitting: Family Medicine

## 2021-08-08 ENCOUNTER — Ambulatory Visit: Payer: BC Managed Care – PPO | Admitting: Internal Medicine

## 2021-08-13 ENCOUNTER — Telehealth: Payer: Self-pay

## 2021-08-13 ENCOUNTER — Encounter: Payer: Self-pay | Admitting: Internal Medicine

## 2021-08-13 ENCOUNTER — Ambulatory Visit: Payer: BC Managed Care – PPO | Admitting: Internal Medicine

## 2021-08-13 VITALS — BP 128/64 | HR 86 | Temp 98.2°F | Resp 16 | Ht 64.0 in | Wt 158.9 lb

## 2021-08-13 DIAGNOSIS — I10 Essential (primary) hypertension: Secondary | ICD-10-CM

## 2021-08-13 DIAGNOSIS — E119 Type 2 diabetes mellitus without complications: Secondary | ICD-10-CM

## 2021-08-13 DIAGNOSIS — Z1211 Encounter for screening for malignant neoplasm of colon: Secondary | ICD-10-CM

## 2021-08-13 DIAGNOSIS — R058 Other specified cough: Secondary | ICD-10-CM

## 2021-08-13 DIAGNOSIS — E782 Mixed hyperlipidemia: Secondary | ICD-10-CM

## 2021-08-13 DIAGNOSIS — K219 Gastro-esophageal reflux disease without esophagitis: Secondary | ICD-10-CM | POA: Diagnosis not present

## 2021-08-13 MED ORDER — BENZONATATE 100 MG PO CAPS: 100.0000 mg | ORAL_CAPSULE | Freq: Two times a day (BID) | ORAL | 0 refills | Status: DC | PRN

## 2021-08-13 NOTE — Progress Notes (Signed)
Established Patient Office Visit  Subjective:  Patient ID: Carrie Cross, female    DOB: [birth year]  Age: 45 y.o. MRN: [id number]  CC:  Chief Complaint  Patient presents with   Follow-up   Hypertension   Hyperlipidemia   Diabetes    HPI Carrie Cross presents for follow up on chronic medical conditions. She recently had the flu last week and was treated with Tamiflu. She does continue to endorse cough but otherwise is feeling well.   Hypertension: -Medications: Amlodipine 10, Metoprolol XL 100,  -Patient is compliant with above medications and reports no side effects. -Checking BP at home (average): doesn't check  -Denies any SOB, CP, vision changes, LE edema or symptoms of hypotension.  HLD: -Medications: Lipitor 20 -Patient is compliant with above medications and reports no side effects.  -Last lipid panel: 9/22 - TC 125, HDL 57, triglycerides 41, LDL 56  Diabetes, Type 2: -Last A1c 9/22 5.9%  -Medications: None -Eye exam: [facility], sees them once a year, saw last December  -Foot exam: due today -Microalbumin: 9/22 normal  -Statin: yes -PNA vaccine: discussed, out of vaccine currently, will give at follow up -Denies symptoms of hypoglycemia, polyuria, polydipsia, numbness extremities, foot ulcers/trauma.   GERD: -Omeprazole 20 mg, doing well, symptoms controlled.  Health Maintenance:  -Blood work up to date -Mammogram: 11/22: Birads 1 -Colon cancer screening: Colonoscopy ordered today, no family history of colon cancer  Past Medical History:  Diagnosis Date   Allergic rhinitis    Carpal tunnel syndrome    Chronic migraine    GERD (gastroesophageal reflux disease)    Hx of pyelonephritis    as a child   Hyperlipidemia    Hypertension    Controlled per pt   IFG (impaired fasting glucose)    Irregular menstruation     Past Surgical History:  Procedure Laterality Date   BACK SURGERY  [id number]   disc shaved down   CERVICAL DISC  ARTHROPLASTY  [id number]    Family History  Problem Relation Age of Onset   Breast cancer Maternal Grandmother 71   Heart disease Maternal Grandfather    Stroke Neg Hx    Diabetes Neg Hx    Ovarian cancer Neg Hx    Colon cancer Neg Hx     Social History   Socioeconomic History   Marital status: Married    Spouse name: Not on file   Number of children: Not on file   Years of education: Not on file   Highest education level: Not on file  Occupational History   Not on file  Tobacco Use   Smoking status: Former    Packs/day: 0.50    Years: 7.00    Pack years: 3.50    Types: Cigarettes    Quit date: 06/02/2011    Years since quitting: 10.2   Smokeless tobacco: Never  Vaping Use   Vaping Use: Never used  Substance and Sexual Activity   Alcohol use: Yes    Alcohol/week: 3.0 standard drinks    Types: 3 Glasses of wine per week   Drug use: No   Sexual activity: Yes    Birth control/protection: I.U.D.    Comment: mirena  Other Topics Concern   Not on file  Social History Narrative   Not on file   Social Determinants of Health   Financial Resource Strain: Not on file  Food Insecurity: Not on file  Transportation Needs: Not on file  Physical Activity: Not on  file  Stress: Not on file  Social Connections: Not on file  Intimate Partner Violence: Not on file    Outpatient Medications Prior to Visit  Medication Sig Dispense Refill   ALLERGY RELIEF 180 MG tablet TAKE 1 TABLET BY MOUTH EVERY DAY FOR ALLERGIES OR SINUS 90 tablet 3   amLODipine (NORVASC) 10 MG tablet TAKE 1 TABLET(10 MG) BY MOUTH DAILY 90 tablet 1   amoxicillin-clavulanate (AUGMENTIN) 875-125 MG tablet Take 1 tablet by mouth 2 (two) times daily. 14 tablet 0   atorvastatin (LIPITOR) 20 MG tablet TAKE 1 TABLET AT BEDTIME 90 tablet 1   clobetasol ointment (TEMOVATE) 9.48 % Apply 1 application topically 2 (two) times daily. Apply to affected area 30 g 2   fluticasone (FLONASE) 50 MCG/ACT nasal spray USE 2  SPRAYS IN EACH       NOSTRIL DAILY. 48 g 1   hydroxychloroquine (PLAQUENIL) 200 MG tablet Take 1 tablet by mouth 2 (two) times daily.     levonorgestrel (MIRENA) 20 MCG/24HR IUD by Intrauterine route. Reported on 12/13/2015     metoprolol succinate (TOPROL-XL) 100 MG 24 hr tablet TAKE 1 TABLET DAILY WITH ORIMMEDIATELY FOLLOWING A    MEAL 90 tablet 1   omeprazole (PRILOSEC) 20 MG capsule Take 1 capsule (20 mg total) by mouth daily before breakfast. 90 capsule 3   oseltamivir (TAMIFLU) 75 MG capsule Take 1 capsule (75 mg total) by mouth 2 (two) times daily. 10 capsule 0   scopolamine (TRANSDERM-SCOP, 1.5 MG,) 1 MG/3DAYS Place 1 patch (1.5 mg total) onto the skin every 3 (three) days. 10 patch 12   No facility-administered medications prior to visit.    No Known Allergies  ROS Review of Systems  Constitutional:  Negative for chills and fever.  Eyes:  Negative for visual disturbance.  Respiratory:  Positive for cough. Negative for shortness of breath and wheezing.   Cardiovascular:  Negative for chest pain.  Gastrointestinal:  Negative for abdominal pain, nausea and vomiting.  Musculoskeletal:  Negative for arthralgias and myalgias.  Skin: Negative.   Neurological:  Negative for dizziness and headaches.     Objective:    Physical Exam Constitutional:      Appearance: Normal appearance.  HENT:     Head: Normocephalic and atraumatic.     Nose: Nose normal.     Mouth/Throat:     Mouth: Mucous membranes are moist.     Pharynx: Oropharynx is clear.  Eyes:     Conjunctiva/sclera: Conjunctivae normal.  Cardiovascular:     Rate and Rhythm: Normal rate and regular rhythm.     Pulses:          Dorsalis pedis pulses are 2+ on the right side and 2+ on the left side.  Pulmonary:     Effort: Pulmonary effort is normal.     Breath sounds: Normal breath sounds.  Musculoskeletal:     Right lower leg: No edema.     Left lower leg: No edema.     Right foot: Normal range of motion. No  deformity, bunion, Charcot foot, foot drop or prominent metatarsal heads.     Left foot: Normal range of motion. No deformity, bunion, Charcot foot, foot drop or prominent metatarsal heads.  Feet:     Right foot:     Protective Sensation: 8 sites tested.  8 sites sensed.     Skin integrity: Skin integrity normal.     Toenail Condition: Right toenails are normal.     Left foot:  Protective Sensation: 8 sites tested.  8 sites sensed.     Skin integrity: Skin integrity normal.     Toenail Condition: Left toenails are normal.  Skin:    General: Skin is warm and dry.  Neurological:     General: No focal deficit present.     Mental Status: She is alert. Mental status is at baseline.  Psychiatric:        Mood and Affect: Mood normal.        Behavior: Behavior normal.    BP 128/64   Pulse 86   Temp 98.2 F (36.8 C) (Oral)   Resp 16   Ht _0  (1.626 m)   Wt 158 lb 14.4 oz (72.1 kg)   SpO2 98%   BMI 27.28 kg/m  Wt Readings from Last 3 Encounters:  05/02/21 167 lb 1.6 oz (75.8 kg)  01/31/21 166 lb 9.6 oz (75.6 kg)  08/07/20 167 lb 14.4 oz (76.2 kg)     Health Maintenance Due  Topic Date Due   Pneumococcal Vaccine 58-84 Years old (1 - PCV) Never done   FOOT EXAM  Never done   OPHTHALMOLOGY EXAM  Never done   COVID-19 Vaccine (4 - Booster for Moderna series) 08/17/2020   COLONOSCOPY (Pts 45-32yr Insurance coverage will need to be confirmed)  Never done    There are no preventive care reminders to display for this patient.  Lab Results  Component Value Date   TSH 0.64 09/26/2020   Lab Results  Component Value Date   WBC 9.4 01/31/2021   HGB 12.8 01/31/2021   HCT 38.0 01/31/2021   MCV 93 01/31/2021   PLT 416 01/31/2021   Lab Results  Component Value Date   NA 138 05/02/2021   K 4.2 05/02/2021   CO2 26 05/02/2021   GLUCOSE 98 05/02/2021   BUN 18 05/02/2021   CREATININE 0.76 05/02/2021   BILITOT 0.4 05/02/2021   ALKPHOS 107 01/31/2021   AST 12 05/02/2021    ALT 17 05/02/2021   PROT 6.8 05/02/2021   ALBUMIN 4.7 01/31/2021   CALCIUM 9.9 05/02/2021   EGFR 99 05/02/2021   Lab Results  Component Value Date   CHOL 125 05/02/2021   Lab Results  Component Value Date   HDL 57 05/02/2021   Lab Results  Component Value Date   LDLCALC 56 05/02/2021   Lab Results  Component Value Date   TRIG 41 05/02/2021   Lab Results  Component Value Date   CHOLHDL 2.2 05/02/2021   Lab Results  Component Value Date   HGBA1C 5.9 (H) 05/02/2021      Assessment & Plan:   1. Type 2 diabetes mellitus without complication, without long-term current use of insulin ([place]: Last A1c good, not currently on medications. Recheck A1c at follow up.   2. Primary hypertension: Stable, blood pressure good today, continue medications.   3. Mixed hyperlipidemia: Stable, continue statin.   4. Gastroesophageal reflux disease without esophagitis: Stable, continue PPI.  5. Post-viral cough syndrome: Cough suppressant for post viral cough.   - benzonatate (TESSALON) 100 MG capsule; Take 1 capsule (100 mg total) by mouth 2 (two) times daily as needed for cough.  Dispense: 20 capsule; Refill: 0  6. Colon cancer screening: Referral to GI for colon cancer screening.  - Ambulatory referral to Gastroenterology   Follow-up: Return in about 6 months (around 02/11/2022).    ETeodora Medici DO

## 2021-08-13 NOTE — Telephone Encounter (Signed)
error 

## 2021-08-13 NOTE — Patient Instructions (Signed)
It was great seeing you today!  Plan discussed at today's visit: -Cough medication sent to pharmacy -Colonoscopy to be scheduled  Follow up in: 6 months   Take care and let us know if you have any questions or concerns prior to your next visit.  Dr. Rosana Berger

## 2021-09-10 ENCOUNTER — Other Ambulatory Visit: Payer: Self-pay | Admitting: Unknown Physician Specialty

## 2021-09-10 NOTE — Telephone Encounter (Signed)
Refilled 07/18/2021 #90 with 1 refill at [facility] . Requested Prescriptions  Pending Prescriptions Disp Refills   amLODipine (NORVASC) 10 MG tablet [Pharmacy Med Name: AMLODIPINE TAB 10MG ] 90 tablet 0    Sig: TAKE 1 TABLET DAILY     Cardiovascular:  Calcium Channel Blockers Passed - 09/10/2021  5:51 PM      Passed - Last BP in normal range    BP Readings from Last 1 Encounters:  08/13/21 128/64         Passed - Valid encounter within last 6 months    Recent Outpatient Visits          4 weeks ago Type 2 diabetes mellitus without complication, without long-term current use of insulin [facility])   [place], DO   4 months ago Need for influenza vaccination   [facility] Kathrine Haddock, NP   11 months ago Pain in right finger(s)   [facility] Delsa Grana, PA-C   1 year ago Pain of middle finger   [facility] Delsa Grana, PA-C   1 year ago Essential hypertension   [facility] Delsa Grana, PA-C      Future Appointments            In 5 months Teodora Medici, [facility], [facility]

## 2021-09-11 NOTE — Telephone Encounter (Signed)
RX REFUSED  .Requesting to soon per dr

## 2021-09-13 ENCOUNTER — Telehealth: Payer: Self-pay

## 2021-09-13 ENCOUNTER — Other Ambulatory Visit: Payer: Self-pay

## 2021-09-13 DIAGNOSIS — Z1211 Encounter for screening for malignant neoplasm of colon: Secondary | ICD-10-CM

## 2021-09-13 MED ORDER — CLENPIQ 10-3.5-12 MG-GM -GM/160ML PO SOLN: 1.0000 | ORAL | 0 refills | Status: DC

## 2021-09-13 NOTE — Telephone Encounter (Signed)
Scheduled for 10/25/2021

## 2021-09-13 NOTE — Progress Notes (Signed)
Gastroenterology Pre-Procedure Review  Request Date: 10/25/2021 Requesting Physician: Dr. Marius Ditch  PATIENT REVIEW QUESTIONS: The patient responded to the following health history questions as indicated:    1. Are you having any GI issues? no 2. Do you have a personal history of Polyps? no 3. Do you have a family history of Colon Cancer or Polyps? no 4. Diabetes Mellitus? no 5. Joint replacements in the past 12 months?no 6. Major health problems in the past 3 months?no 7. Any artificial heart valves, MVP, or defibrillator?no    MEDICATIONS & ALLERGIES:    Patient reports the following regarding taking any anticoagulation/antiplatelet therapy:   Plavix, Coumadin, Eliquis, Xarelto, Lovenox, Pradaxa, Brilinta, or Effient? no Aspirin? no  Patient confirms/reports the following medications:  Current Outpatient Medications  Medication Sig Dispense Refill   ALLERGY RELIEF 180 MG tablet TAKE 1 TABLET BY MOUTH EVERY DAY FOR ALLERGIES OR SINUS 90 tablet 3   amLODipine (NORVASC) 10 MG tablet TAKE 1 TABLET(10 MG) BY MOUTH DAILY 90 tablet 1   atorvastatin (LIPITOR) 20 MG tablet TAKE 1 TABLET AT BEDTIME 90 tablet 1   benzonatate (TESSALON) 100 MG capsule Take 1 capsule (100 mg total) by mouth 2 (two) times daily as needed for cough. 20 capsule 0   clobetasol ointment (TEMOVATE) 3.75 % Apply 1 application topically 2 (two) times daily. Apply to affected area 30 g 2   fluticasone (FLONASE) 50 MCG/ACT nasal spray USE 2 SPRAYS IN EACH       NOSTRIL DAILY. 48 g 1   levonorgestrel (MIRENA) 20 MCG/24HR IUD by Intrauterine route. Reported on 12/13/2015     metoprolol succinate (TOPROL-XL) 100 MG 24 hr tablet TAKE 1 TABLET DAILY WITH ORIMMEDIATELY FOLLOWING A    MEAL 90 tablet 1   omeprazole (PRILOSEC) 20 MG capsule Take 1 capsule (20 mg total) by mouth daily before breakfast. 90 capsule 3   scopolamine (TRANSDERM-SCOP, 1.5 MG,) 1 MG/3DAYS Place 1 patch (1.5 mg total) onto the skin every 3 (three) days. 10  patch 12   No current facility-administered medications for this visit.    Patient confirms/reports the following allergies:  No Known Allergies  No orders of the defined types were placed in this encounter.   AUTHORIZATION INFORMATION Primary Insurance: 1D#: Group #:  Secondary Insurance: 1D#: Group #:  SCHEDULE INFORMATION: Date: 10/25/2021 Time: Location: [facility]

## 2021-09-13 NOTE — Telephone Encounter (Signed)
Patient is ready to schedule procedure. Clinical staff will follow up with patient. °

## 2021-10-01 ENCOUNTER — Other Ambulatory Visit: Payer: Self-pay | Admitting: Unknown Physician Specialty

## 2021-10-01 ENCOUNTER — Other Ambulatory Visit: Payer: Self-pay | Admitting: Family Medicine

## 2021-10-01 DIAGNOSIS — E782 Mixed hyperlipidemia: Secondary | ICD-10-CM

## 2021-10-01 DIAGNOSIS — I10 Essential (primary) hypertension: Secondary | ICD-10-CM

## 2021-10-01 NOTE — Telephone Encounter (Signed)
Requested Prescriptions  Pending Prescriptions Disp Refills   metoprolol succinate (TOPROL-XL) 100 MG 24 hr tablet [Pharmacy Med Name: METOPROL SUC TAB 100MG  ER] 90 tablet 1    Sig: TAKE 1 TABLET DAILY WITH ORIMMEDIATELY FOLLOWING A    MEAL     Cardiovascular:  Beta Blockers Passed - 10/01/2021  2:20 AM      Passed - Last BP in normal range    BP Readings from Last 1 Encounters:  08/13/21 128/64         Passed - Last Heart Rate in normal range    Pulse Readings from Last 1 Encounters:  08/13/21 86         Passed - Valid encounter within last 6 months    Recent Outpatient Visits          1 month ago Type 2 diabetes mellitus without complication, without long-term current use of insulin [facility])   [place], DO   5 months ago Need for influenza vaccination   [facility] Kathrine Haddock, NP   1 year ago Pain in right finger(s)   [facility] Delsa Grana, PA-C   1 year ago Pain of middle finger   [facility] Delsa Grana, PA-C   1 year ago Essential hypertension   [facility] Delsa Grana, PA-C      Future Appointments            In 4 months Teodora Medici, [facility], [facility]

## 2021-10-01 NOTE — Telephone Encounter (Signed)
Requested Prescriptions  Pending Prescriptions Disp Refills   fluticasone (FLONASE) 50 MCG/ACT nasal spray [Pharmacy Med Name: FLUTICASONE  SPR 50MCG RX] 48 g 1    Sig: USE 2 SPRAYS IN EACH       NOSTRIL DAILY.     Ear, Nose, and Throat: Nasal Preparations - Corticosteroids Passed - 10/01/2021  2:20 AM      Passed - Valid encounter within last 12 months    Recent Outpatient Visits          1 month ago Type 2 diabetes mellitus without complication, without long-term current use of insulin [facility])   [facility] Teodora Medici, DO   5 months ago Need for influenza vaccination   [facility] Kathrine Haddock, NP   1 year ago Pain in right finger(s)   [facility] Delsa Grana, PA-C   1 year ago Pain of middle finger   [facility] Delsa Grana, PA-C   1 year ago Essential hypertension   [facility] Delsa Grana, [place]      Future Appointments            In 4 months Teodora Medici, [facility], [facility]            atorvastatin (LIPITOR) 20 MG tablet [Pharmacy Med Name: ATORVASTATIN TAB 20MG ] 90 tablet 1    Sig: TAKE 1 TABLET AT BEDTIME     Cardiovascular:  Antilipid - Statins Passed - 10/01/2021  2:20 AM      Passed - Total Cholesterol in normal range and within 360 days    Cholesterol, Total  Date Value Ref Range Status  01/09/2016 144 100 - 199 mg/dL Final   Cholesterol  Date Value Ref Range Status  05/02/2021 125 <200 mg/dL Final         Passed - LDL in normal range and within 360 days    LDL Cholesterol (Calc)  Date Value Ref Range Status  05/02/2021 56 mg/dL (calc) Final    Comment:    Reference range: <100 . Desirable range <100 mg/dL for primary prevention;   <70 mg/dL for patients with CHD or diabetic patients  with > or = 2 CHD risk factors. Marland Kitchen LDL-C is now calculated using the Martin-Hopkins  calculation, which is a validated novel method  providing  better accuracy than the Friedewald equation in the  estimation of LDL-C.  Cresenciano Genre et al. Annamaria Helling. 9470;962(83): 2061-2068  (http://education.[employer].com/faq/FAQ164)          Passed - HDL in normal range and within 360 days    HDL  Date Value Ref Range Status  05/02/2021 57 > OR = 50 mg/dL Final  01/09/2016 57 >39 mg/dL Final         Passed - Triglycerides in normal range and within 360 days    Triglycerides  Date Value Ref Range Status  05/02/2021 41 <150 mg/dL Final         Passed - Patient is not pregnant      Passed - Valid encounter within last 12 months    Recent Outpatient Visits          1 month ago Type 2 diabetes mellitus without complication, without long-term current use of insulin [facility])   [place], DO   5 months ago Need for influenza vaccination   [facility] Kathrine Haddock, NP   1 year ago Pain in right finger(s)  [facility] Delsa Grana, PA-C   1 year ago Pain of middle finger   [facility] Delsa Grana, PA-C   1 year ago Essential hypertension   [facility] Delsa Grana, PA-C      Future Appointments            In 4 months Teodora Medici, [facility], [facility]

## 2021-10-25 ENCOUNTER — Ambulatory Visit
Admission: RE | Admit: 2021-10-25 | Discharge: 2021-10-25 | Disposition: A | Discharge status: Home / Self Care | Payer: BC Managed Care – PPO | Attending: Gastroenterology | Admitting: Gastroenterology

## 2021-10-25 ENCOUNTER — Ambulatory Visit: Payer: BC Managed Care – PPO | Admitting: Anesthesiology

## 2021-10-25 ENCOUNTER — Encounter
Admission: RE | Disposition: A | Discharge status: Home / Self Care | Payer: Self-pay | Source: Home / Self Care | Attending: Gastroenterology

## 2021-10-25 ENCOUNTER — Encounter: Payer: Self-pay | Admitting: Gastroenterology

## 2021-10-25 DIAGNOSIS — J309 Allergic rhinitis, unspecified: Secondary | ICD-10-CM | POA: Diagnosis not present

## 2021-10-25 DIAGNOSIS — K219 Gastro-esophageal reflux disease without esophagitis: Secondary | ICD-10-CM | POA: Diagnosis not present

## 2021-10-25 DIAGNOSIS — D125 Benign neoplasm of sigmoid colon: Secondary | ICD-10-CM | POA: Diagnosis not present

## 2021-10-25 DIAGNOSIS — Z87891 Personal history of nicotine dependence: Secondary | ICD-10-CM | POA: Insufficient documentation

## 2021-10-25 DIAGNOSIS — I10 Essential (primary) hypertension: Secondary | ICD-10-CM | POA: Insufficient documentation

## 2021-10-25 DIAGNOSIS — Z1211 Encounter for screening for malignant neoplasm of colon: Secondary | ICD-10-CM | POA: Insufficient documentation

## 2021-10-25 DIAGNOSIS — E785 Hyperlipidemia, unspecified: Secondary | ICD-10-CM | POA: Insufficient documentation

## 2021-10-25 HISTORY — PX: COLONOSCOPY WITH PROPOFOL: SHX5780

## 2021-10-25 LAB — POCT PREGNANCY, URINE: Preg Test, Ur: NEGATIVE

## 2021-10-25 SURGERY — COLONOSCOPY WITH PROPOFOL
Anesthesia: General

## 2021-10-25 MED ORDER — PROPOFOL 500 MG/50ML IV EMUL
INTRAVENOUS | Status: DC | PRN
  Administered 2021-10-25: 200 ug/kg/min via INTRAVENOUS

## 2021-10-25 MED ORDER — PROPOFOL 10 MG/ML IV BOLUS
INTRAVENOUS | Status: DC | PRN
  Administered 2021-10-25: 30 mg via INTRAVENOUS
  Administered 2021-10-25: 100 mg via INTRAVENOUS

## 2021-10-25 MED ORDER — SODIUM CHLORIDE 0.9 % IV SOLN
INTRAVENOUS | Status: DC
  Administered 2021-10-25: 1000 mL via INTRAVENOUS

## 2021-10-25 NOTE — Transfer of Care (Signed)
Immediate Anesthesia Transfer of Care Note  Patient: Carrie Cross  Procedure(s) Performed: COLONOSCOPY WITH PROPOFOL  Patient Location: PACU  Anesthesia Type:General  Level of Consciousness: awake, alert  and oriented  Airway & Oxygen Therapy: Patient Spontanous Breathing and Patient connected to nasal cannula oxygen  Post-op Assessment: Report given to RN and Post -op Vital signs reviewed and stable  Post vital signs: Reviewed and stable  Last Vitals:  Vitals Value Taken Time  BP 130/91 10/25/21 0830  Temp    Pulse 90 10/25/21 0835  Resp 18 10/25/21 0835  SpO2 100 % 10/25/21 0835  Vitals shown include unvalidated device data.  Last Pain:  Vitals:   10/25/21 0830  TempSrc: Temporal  PainSc:          Complications: No notable events documented.

## 2021-10-25 NOTE — Anesthesia Postprocedure Evaluation (Signed)
Anesthesia Post Note  Patient: Carrie Cross  Procedure(s) Performed: COLONOSCOPY WITH PROPOFOL  Patient location during evaluation: Endoscopy Anesthesia Type: General Level of consciousness: awake and alert Pain management: pain level controlled Vital Signs Assessment: post-procedure vital signs reviewed and stable Respiratory status: spontaneous breathing, nonlabored ventilation, respiratory function stable and patient connected to nasal cannula oxygen Cardiovascular status: blood pressure returned to baseline and stable Postop Assessment: no apparent nausea or vomiting Anesthetic complications: no   No notable events documented.   Last Vitals:  Vitals:   10/25/21 0711  BP: (!) 162/86  Pulse: 79  Resp: 16  Temp: (!) 35.9 C  SpO2: 100%    Last Pain:  Vitals:   10/25/21 0850  TempSrc:   PainSc: 0-No pain                 Precious Haws Arrie Borrelli

## 2021-10-25 NOTE — Anesthesia Preprocedure Evaluation (Signed)
Anesthesia Evaluation  Patient identified by MRN, date of birth, ID band Patient awake    Reviewed: Allergy & Precautions, NPO status , Patient's Chart, lab work & pertinent test results  Airway Mallampati: III  TM Distance: >3 FB Neck ROM: full    Dental  (+) Chipped   Pulmonary neg pulmonary ROS, neg shortness of breath, former smoker,    Pulmonary exam normal        Cardiovascular Exercise Tolerance: Good hypertension, (-) angina(-) Past MI and (-) DOE Normal cardiovascular exam     Neuro/Psych  Headaches,  Neuromuscular disease negative psych ROS   GI/Hepatic Neg liver ROS, GERD  Controlled,  Endo/Other  diabetes, Type 2  Renal/GU negative Renal ROS  negative genitourinary   Musculoskeletal  (+) Arthritis ,   Abdominal   Peds  Hematology negative hematology ROS (+)   Anesthesia Other Findings Past Medical History: No date: Allergic rhinitis No date: Carpal tunnel syndrome No date: Chronic migraine No date: GERD (gastroesophageal reflux disease) No date: Hx of pyelonephritis     Comment:  as a child No date: Hyperlipidemia No date: Hypertension     Comment:  Controlled per pt No date: IFG (impaired fasting glucose) No date: Irregular menstruation  Past Surgical History: [id number]: BACK SURGERY     Comment:  disc shaved down [id number]: CERVICAL DISC ARTHROPLASTY  BMI    Body Mass Index: 27.52 kg/m      Reproductive/Obstetrics negative OB ROS                             Anesthesia Physical Anesthesia Plan  ASA: 3  Anesthesia Plan: General   Post-op Pain Management:    Induction: Intravenous  PONV Risk Score and Plan: Propofol infusion and TIVA  Airway Management Planned: Natural Airway and Nasal Cannula  Additional Equipment:   Intra-op Plan:   Post-operative Plan:   Informed Consent: I have reviewed the patients History and Physical, chart, labs and  discussed the procedure including the risks, benefits and alternatives for the proposed anesthesia with the patient or authorized representative who has indicated his/her understanding and acceptance.     Dental Advisory Given  Plan Discussed with: Anesthesiologist, CRNA and Surgeon  Anesthesia Plan Comments: (Patient consented for risks of anesthesia including but not limited to:  - adverse reactions to medications - risk of airway placement if required - damage to eyes, teeth, lips or other oral mucosa - nerve damage due to positioning  - sore throat or hoarseness - Damage to heart, brain, nerves, lungs, other parts of body or loss of life  Patient voiced understanding.)        Anesthesia Quick Evaluation

## 2021-10-25 NOTE — H&P (Signed)
Carrie Darby, MD [address]  [place]  [place], [place] [address]  Main: [phone]  Fax: [phone] Pager: [phone]  Primary Care Physician:  Delsa Grana, PA-C Primary Gastroenterologist:  Dr. Cephas Cross  Pre-Procedure History & Physical: HPI:  Carrie Cross is a 46 y.o. female is here for an colonoscopy.   Past Medical History:  Diagnosis Date   Allergic rhinitis    Carpal tunnel syndrome    Chronic migraine    GERD (gastroesophageal reflux disease)    Hx of pyelonephritis    as a child   Hyperlipidemia    Hypertension    Controlled per pt   IFG (impaired fasting glucose)    Irregular menstruation     Past Surgical History:  Procedure Laterality Date   BACK SURGERY  [id number]   disc shaved down   CERVICAL DISC ARTHROPLASTY  [id number]    Prior to Admission medications   Medication Sig Start Date End Date Taking? Authorizing Provider  ALLERGY RELIEF 180 MG tablet TAKE 1 TABLET BY MOUTH EVERY DAY FOR ALLERGIES OR SINUS 02/18/21  Yes Delsa Grana, PA-C  amLODipine (NORVASC) 10 MG tablet TAKE 1 TABLET(10 MG) BY MOUTH DAILY 07/18/21  Yes Delsa Grana, PA-C  atorvastatin (LIPITOR) 20 MG tablet TAKE 1 TABLET AT BEDTIME 10/01/21  Yes Delsa Grana, PA-C  metoprolol succinate (TOPROL-XL) 100 MG 24 hr tablet TAKE 1 TABLET DAILY WITH ORIMMEDIATELY FOLLOWING A    MEAL 10/01/21  Yes Delsa Grana, PA-C  omeprazole (PRILOSEC) 20 MG capsule Take 1 capsule (20 mg total) by mouth daily before breakfast. 05/02/21  Yes Kathrine Haddock, NP  Sod Picosulfate-Mag Ox-Cit Acd (CLENPIQ) 10-3.5-12 MG-GM -GM/160ML SOLN Take 1 kit by mouth as directed. At 5 PM evening before procedure, drink 1 bottle of Clenpiq, hydrate, drink (5) 8 oz of water. Then do the same thing 5 hours prior to your procedure. 09/13/21  Yes Krikor Willet, Tally Due, MD  benzonatate (TESSALON) 100 MG capsule Take 1 capsule (100 mg total) by mouth 2 (two) times daily as needed for cough. 08/13/21   Teodora Medici, DO  clobetasol ointment (TEMOVATE) 7.61 % Apply 1 application topically 2 (two) times daily. Apply to affected area 08/16/19   Delsa Grana, PA-C  fluticasone (FLONASE) 50 MCG/ACT nasal spray USE 2 SPRAYS IN EACH       NOSTRIL DAILY. 10/01/21   Delsa Grana, PA-C  levonorgestrel (MIRENA) 20 MCG/24HR IUD by Intrauterine route. Reported on 12/13/2015    [provider]  scopolamine (TRANSDERM-SCOP, 1.5 MG,) 1 MG/3DAYS Place 1 patch (1.5 mg total) onto the skin every 3 (three) days. 06/27/21   Kathrine Haddock, NP    Allergies as of 09/13/2021   (No Known Allergies)    Family History  Problem Relation Age of Onset   Breast cancer Maternal Grandmother 63   Heart disease Maternal Grandfather    Stroke Neg Hx    Diabetes Neg Hx    Ovarian cancer Neg Hx    Colon cancer Neg Hx     Social History   Socioeconomic History   Marital status: Married    Spouse name: Not on file   Number of children: Not on file   Years of education: Not on file   Highest education level: Not on file  Occupational History   Not on file  Tobacco Use   Smoking status: Former    Packs/day: 0.50    Years: 7.00    Pack years: 3.50  Types: Cigarettes    Quit date: 06/02/2011    Years since quitting: 10.4   Smokeless tobacco: Never  Vaping Use   Vaping Use: Never used  Substance and Sexual Activity   Alcohol use: Yes    Alcohol/week: 3.0 standard drinks    Types: 3 Glasses of wine per week   Drug use: No   Sexual activity: Yes    Birth control/protection: I.U.D.    Comment: mirena  Other Topics Concern   Not on file  Social History Narrative   Not on file   Social Determinants of Health   Financial Resource Strain: Not on file  Food Insecurity: Not on file  Transportation Needs: Not on file  Physical Activity: Not on file  Stress: Not on file  Social Connections: Not on file  Intimate Partner Violence: Not on file    Review of Systems: See HPI, otherwise negative  ROS  Physical Exam: BP (!) 162/86    Pulse 79    Temp (!) 96.7 F (35.9 C) (Temporal)    Resp 16    Ht _0  (1.626 m)    Wt 72.7 kg    SpO2 100%    BMI 27.52 kg/m  General:   Alert,  pleasant and cooperative in NAD Head:  Normocephalic and atraumatic. Neck:  Supple; no masses or thyromegaly. Lungs:  Clear throughout to auscultation.    Heart:  Regular rate and rhythm. Abdomen:  Soft, nontender and nondistended. Normal bowel sounds, without guarding, and without rebound.   Neurologic:  Alert and  oriented x4;  grossly normal neurologically.  Impression/Plan: Carrie Cross is here for an colonoscopy to be performed for colon cancer screening  Risks, benefits, limitations, and alternatives regarding  colonoscopy have been reviewed with the patient.  Questions have been answered.  All parties agreeable.   Sherri Sear, MD  10/25/2021, 7:36 AM

## 2021-10-25 NOTE — Op Note (Signed)
[facility] Patient Name: Carrie Cross Procedure Date: 10/25/2021 7:23 AM MRN: [id number] Account #: [id number] Date of Birth: 1976/04/30 Admit Type: Outpatient Age: 46 Room: [facility] ENDO ROOM 2 Gender: Female Note Status: Finalized Instrument Name: Jasper Riling [id number] Procedure:             Colonoscopy Indications:           Screening for colorectal malignant neoplasm, This is                         the patient's first colonoscopy Providers:             Lin Landsman MD, MD Referring MD:          Delsa Grana (Referring MD) Medicines:             General Anesthesia Complications:         No immediate complications. Estimated blood loss: None. Procedure:             Pre-Anesthesia Assessment:                        - Prior to the procedure, a History and Physical was                         performed, and patient medications and allergies were                         reviewed. The patient is competent. The risks and                         benefits of the procedure and the sedation options and                         risks were discussed with the patient. All questions                         were answered and informed consent was obtained.                         Patient identification and proposed procedure were                         verified by the physician, the nurse, the                         anesthesiologist, the anesthetist and the technician                         in the pre-procedure area in the procedure room in the                         endoscopy suite. Mental Status Examination: alert and                         oriented. Airway Examination: normal oropharyngeal                         airway and neck mobility. Respiratory Examination:  clear to auscultation. CV Examination: normal.                         Prophylactic Antibiotics: The patient does not require                         prophylactic  antibiotics. Prior Anticoagulants: The                         patient has taken no previous anticoagulant or                         antiplatelet agents. ASA Grade Assessment: II - A                         patient with mild systemic disease. After reviewing                         the risks and benefits, the patient was deemed in                         satisfactory condition to undergo the procedure. The                         anesthesia plan was to use general anesthesia.                         Immediately prior to administration of medications,                         the patient was re-assessed for adequacy to receive                         sedatives. The heart rate, respiratory rate, oxygen                         saturations, blood pressure, adequacy of pulmonary                         ventilation, and response to care were monitored                         throughout the procedure. The physical status of the                         patient was re-assessed after the procedure.                        After obtaining informed consent, the colonoscope was                         passed under direct vision. Throughout the procedure,                         the patient's blood pressure, pulse, and oxygen                         saturations were monitored continuously. The  Colonoscope was introduced through the anus and                         advanced to the the cecum, identified by appendiceal                         orifice and ileocecal valve. The colonoscopy was                         performed without difficulty. The patient tolerated                         the procedure well. The quality of the bowel                         preparation was evaluated using the BBPS [facility] Bowel                         Preparation Scale) with scores of: Right Colon = 3,                         Transverse Colon = 3 and Left Colon = 3 (entire mucosa                          seen well with no residual staining, small fragments                         of stool or opaque liquid). The total BBPS score                         equals 9. Findings:      The perianal and digital rectal examinations were normal. Pertinent       negatives include normal sphincter tone and no palpable rectal lesions.      A 12 mm polyp was found in the sigmoid colon. The polyp was       pedunculated. The polyp was removed with a hot snare. Resection and       retrieval were complete. To prevent bleeding after the polypectomy, two       hemostatic clips were successfully placed (MR conditional). There was no       bleeding during, or at the end, of the procedure.      A 8 mm polyp was found in the sigmoid colon. The polyp was pedunculated.       The polyp was removed with a hot snare. Resection and retrieval were       complete.      A 6 mm polyp was found in the sigmoid colon. The polyp was sessile. The       polyp was removed with a cold snare. Resection and retrieval were       complete.      The retroflexed view of the distal rectum and anal verge was normal and       showed no anal or rectal abnormalities. Impression:            - One 12 mm polyp in the sigmoid colon, removed with a  hot snare. Resected and retrieved. Clips (MR                         conditional) were placed.                        - One 8 mm polyp in the sigmoid colon, removed with a                         hot snare. Resected and retrieved.                        - One 6 mm polyp in the sigmoid colon, removed with a                         cold snare. Resected and retrieved.                        - The distal rectum and anal verge are normal on                         retroflexion view. Recommendation:        - Discharge patient to home (with escort).                        - Resume previous diet today.                        - Continue present medications.                        -  Await pathology results.                        - Repeat colonoscopy in 3 years for surveillance of                         multiple polyps. Procedure Code(s):     --- Professional ---                        [phone], Colonoscopy, flexible; with removal of                         tumor(s), polyp(s), or other lesion(s) by snare                         technique Diagnosis Code(s):     --- Professional ---                        Z12.11, Encounter for screening for malignant neoplasm                         of colon                        K63.5, Polyp of colon CPT copyright 2019 American Medical Association. All rights reserved. The codes documented in this report are preliminary and upon coder review may  be revised to meet current compliance requirements. Dr. Ulyess Mort Maansi Wike Raeanne Gathers MD,  MD 10/25/2021 8:29:23 AM This report has been signed electronically. Number of Addenda: 0 Note Initiated On: 10/25/2021 7:23 AM Scope Withdrawal Time: 0 hours 19 minutes 21 seconds  Total Procedure Duration: 0 hours 22 minutes 37 seconds  Estimated Blood Loss:  Estimated blood loss: none.      [facility]

## 2021-10-25 NOTE — Anesthesia Procedure Notes (Signed)
Date/Time: 10/25/2021 7:54 AM Performed by: Nelda Marseille, CRNA Pre-anesthesia Checklist: Patient identified, Emergency Drugs available, Suction available, Patient being monitored and Timeout performed Oxygen Delivery Method: Nasal cannula

## 2021-10-28 ENCOUNTER — Encounter: Payer: Self-pay | Admitting: Gastroenterology

## 2021-10-28 LAB — SURGICAL PATHOLOGY

## 2021-10-29 ENCOUNTER — Encounter: Payer: Self-pay | Admitting: Gastroenterology

## 2021-11-15 ENCOUNTER — Encounter: Payer: Self-pay | Admitting: Internal Medicine

## 2021-11-15 ENCOUNTER — Other Ambulatory Visit: Payer: Self-pay | Admitting: Family Medicine

## 2021-11-18 ENCOUNTER — Telehealth: Payer: BC Managed Care – PPO | Admitting: Physician Assistant

## 2021-11-18 DIAGNOSIS — H6983 Other specified disorders of Eustachian tube, bilateral: Secondary | ICD-10-CM | POA: Diagnosis not present

## 2021-11-18 MED ORDER — PREDNISONE 10 MG (21) PO TBPK: ORAL_TABLET | ORAL | 0 refills | Status: DC

## 2021-11-18 NOTE — Patient Instructions (Signed)
?Carrie Cross, thank you for joining Mar Daring, PA-C for today's virtual visit.  While this provider is not your primary care provider (PCP), if your PCP is located in our provider database this encounter information will be shared with them immediately following your visit. ? ?Consent: ?(Patient) Carrie Cross provided verbal consent for this virtual visit at the beginning of the encounter. ? ?Current Medications: ? ?Current Outpatient Medications:  ?  predniSONE (STERAPRED UNI-PAK 21 TAB) 10 MG (21) TBPK tablet, 6 day taper; take as directed on package instructions, Disp: 21 tablet, Rfl: 0 ?  ALLERGY RELIEF 180 MG tablet, TAKE 1 TABLET BY MOUTH EVERY DAY FOR ALLERGIES OR SINUS, Disp: 90 tablet, Rfl: 3 ?  amLODipine (NORVASC) 10 MG tablet, TAKE 1 TABLET(10 MG) BY MOUTH DAILY, Disp: 90 tablet, Rfl: 1 ?  atorvastatin (LIPITOR) 20 MG tablet, TAKE 1 TABLET AT BEDTIME, Disp: 90 tablet, Rfl: 1 ?  benzonatate (TESSALON) 100 MG capsule, Take 1 capsule (100 mg total) by mouth 2 (two) times daily as needed for cough., Disp: 20 capsule, Rfl: 0 ?  clobetasol ointment (TEMOVATE) 7.82 %, Apply 1 application topically 2 (two) times daily. Apply to affected area, Disp: 30 g, Rfl: 2 ?  fluticasone (FLONASE) 50 MCG/ACT nasal spray, USE 2 SPRAYS IN EACH       NOSTRIL DAILY., Disp: 48 g, Rfl: 1 ?  levonorgestrel (MIRENA) 20 MCG/24HR IUD, by Intrauterine route. Reported on 12/13/2015, Disp: , Rfl:  ?  metoprolol succinate (TOPROL-XL) 100 MG 24 hr tablet, TAKE 1 TABLET DAILY WITH ORIMMEDIATELY FOLLOWING A    MEAL, Disp: 90 tablet, Rfl: 1 ?  omeprazole (PRILOSEC) 20 MG capsule, Take 1 capsule (20 mg total) by mouth daily before breakfast., Disp: 90 capsule, Rfl: 3 ?  scopolamine (TRANSDERM-SCOP, 1.5 MG,) 1 MG/3DAYS, Place 1 patch (1.5 mg total) onto the skin every 3 (three) days., Disp: 10 patch, Rfl: 12  ? ?Medications ordered in this encounter:  ?Meds ordered this encounter  ?Medications  ? predniSONE (STERAPRED  UNI-PAK 21 TAB) 10 MG (21) TBPK tablet  ?  Sig: 6 day taper; take as directed on package instructions  ?  Dispense:  21 tablet  ?  Refill:  0  ?  Order Specific Question:   Supervising Provider  ?  Answer:   Noemi Chapel [[id number]]  ?  ? ?*If you need refills on other medications prior to your next appointment, please contact your pharmacy* ? ?Follow-Up: ?Call back or seek an in-person evaluation if the symptoms worsen or if the condition fails to improve as anticipated. ? ?Other Instructions ?Eustachian Tube Dysfunction ?Eustachian tube dysfunction refers to a condition in which a blockage develops in the narrow passage that connects the middle ear to the back of the nose (eustachian tube). The eustachian tube regulates air pressure in the middle ear by letting air move between the ear and nose. It also helps to drain fluid from the middle ear space. ?Eustachian tube dysfunction can affect one or both ears. When the eustachian tube does not function properly, air pressure, fluid, or both can build up in the middle ear. ?What are the causes? ?This condition occurs when the eustachian tube becomes blocked or cannot open normally. Common causes of this condition include: ?Ear infections. ?Colds and other infections that affect the nose, mouth, and throat (upper respiratory tract). ?Allergies. ?Irritation from cigarette smoke. ?Irritation from stomach acid coming up into the esophagus (gastroesophageal reflux). The esophagus is the part of the  body that moves food from the mouth to the stomach. ?Sudden changes in air pressure, such as from descending in an airplane or scuba diving. ?Abnormal growths in the nose or throat, such as: ?Growths that line the nose (nasal polyps). ?Abnormal growth of cells (tumors). ?Enlarged tissue at the back of the throat (adenoids). ?What increases the risk? ?You are more likely to develop this condition if: ?You smoke. ?You are overweight. ?You are a child who has: ?Certain birth defects of  the mouth, such as cleft palate. ?Large tonsils or adenoids. ?What are the signs or symptoms? ?Common symptoms of this condition include: ?A feeling of fullness in the ear. ?Ear pain. ?Clicking or popping noises in the ear. ?Ringing in the ear (tinnitus). ?Hearing loss. ?Loss of balance. ?Dizziness. ?Symptoms may get worse when the air pressure around you changes, such as when you travel to an area of high elevation, fly on an airplane, or go scuba diving. ?How is this diagnosed? ?This condition may be diagnosed based on: ?Your symptoms. ?A physical exam of your ears, nose, and throat. ?Tests, such as those that measure: ?The movement of your eardrum. ?Your hearing (audiometry). ?How is this treated? ?Treatment depends on the cause and severity of your condition. ?In mild cases, you may relieve your symptoms by moving air into your ears. This is called "popping the ears." ?In more severe cases, or if you have symptoms of fluid in your ears, treatment may include: ?Medicines to relieve congestion (decongestants). ?Medicines that treat allergies (antihistamines). ?Nasal sprays or ear drops that contain medicines that reduce swelling (steroids). ?A procedure to drain the fluid in your eardrum. In this procedure, a small tube may be placed in the eardrum to: ?Drain the fluid. ?Restore the air in the middle ear space. ?A procedure to insert a balloon device through the nose to inflate the opening of the eustachian tube (balloon dilation). ?Follow these instructions at home: ?Lifestyle ?Do not do any of the following until your health care provider approves: ?Travel to high altitudes. ?Fly in airplanes. ?Work in a Pension scheme manager or room. ?Scuba dive. ?Do not use any products that contain nicotine or tobacco. These products include cigarettes, chewing tobacco, and vaping devices, such as e-cigarettes. If you need help quitting, ask your health care provider. ?Keep your ears dry. Wear fitted earplugs during showering and  bathing. Dry your ears completely after. ?General instructions ?Take over-the-counter and prescription medicines only as told by your health care provider. ?Use techniques to help pop your ears as recommended by your health care provider. These may include: ?Chewing gum. ?Yawning. ?Frequent, forceful swallowing. ?Closing your mouth, holding your nose closed, and gently blowing as if you are trying to blow air out of your nose. ?Keep all follow-up visits. This is important. ?Contact a health care provider if: ?Your symptoms do not go away after treatment. ?Your symptoms come back after treatment. ?You are unable to pop your ears. ?You have: ?A fever. ?Pain in your ear. ?Pain in your head or neck. ?Fluid draining from your ear. ?Your hearing suddenly changes. ?You become very dizzy. ?You lose your balance. ?Get help right away if: ?You have a sudden, severe increase in any of your symptoms. ?Summary ?Eustachian tube dysfunction refers to a condition in which a blockage develops in the eustachian tube. ?It can be caused by ear infections, allergies, inhaled irritants, or abnormal growths in the nose or throat. ?Symptoms may include ear pain or fullness, hearing loss, or ringing in the ears. ?Mild  cases are treated with techniques to unblock the ears, such as yawning or chewing gum. ?More severe cases are treated with medicines or procedures. ?This information is not intended to replace advice given to you by your health care provider. Make sure you discuss any questions you have with your health care provider. ?Document Revised: 10/29/2020 Document Reviewed: 10/29/2020 ?Elsevier Patient Education ? 2022 [place]. ? ? ? ?If you have been instructed to have an in-person evaluation today at a local Urgent Care facility, please use the link below. It will take you to a list of all of our available [facility], including address, phone number and hours of operation. Please do not delay care.  ?[facility] ? ?If you or a family member do not have a primary care provider, use the link below to schedule a visit and establish care. When you choose a [facility] primary care physician or advan

## 2021-11-18 NOTE — Progress Notes (Signed)
?Virtual Visit Consent  ? ?Carrie Cross, you are scheduled for a virtual visit with a [facility] provider today.   ?  ?Just as with appointments in the office, your consent must be obtained to participate.  Your consent will be active for this visit and any virtual visit you may have with one of our providers in the next 365 days.   ?  ?If you have a MyChart account, a copy of this consent can be sent to you electronically.  All virtual visits are billed to your insurance company just like a traditional visit in the office.   ? ?As this is a virtual visit, video technology does not allow for your provider to perform a traditional examination.  This may limit your provider's ability to fully assess your condition.  If your provider identifies any concerns that need to be evaluated in person or the need to arrange testing (such as labs, EKG, etc.), we will make arrangements to do so.   ?  ?Although advances in technology are sophisticated, we cannot ensure that it will always work on either your end or our end.  If the connection with a video visit is poor, the visit may have to be switched to a telephone visit.  With either a video or telephone visit, we are not always able to ensure that we have a secure connection.    ? ?I need to obtain your verbal consent now.   Are you willing to proceed with your visit today?  ?  ?Carrie Cross has provided verbal consent on 11/18/2021 for a virtual visit (video or telephone). ?  ?Mar Daring, PA-C  ? ?Date: 11/18/2021 1:40 PM ? ? ?Virtual Visit via Video Note  ? ?Carrie Cross, connected with  Carrie Cross  ([id number], 11-23-1975) on 11/18/21 at  1:30 PM EDT by a video-enabled telemedicine application and verified that I am speaking with the correct person using two identifiers. ? ?Location: ?Patient: Virtual Visit Location Patient: Mobile ?Provider: Virtual Visit Location Provider: Home Office ?  ?I discussed the limitations of evaluation  and management by telemedicine and the availability of in person appointments. The patient expressed understanding and agreed to proceed.   ? ?History of Present Illness: ?Carrie Cross is a 46 y.o. who identifies as a female who was assigned female at birth, and is being seen today for lightheaded feeling for last few weeks. Started about 3.5 weeks ago. Had traveled the week prior to the [place] Had ran out of one of her BP medications, but not sure exactly when. Had school nurse check BP and was normal. Has been back on BP medications since Friday. Lightheadedness sensation is intermittent. Denies feeling like pre-syncopal. More of an off balance sensation that happens intermittently. ? ? ? ?Problems:  ?Patient Active Problem List  ? Diagnosis Date Noted  ? Colon cancer screening   ? Adenomatous polyp of sigmoid colon   ? Inflammatory arthritis 05/02/2021  ? Diabetes ([place]) 05/02/2021  ? Muscle cramps 02/18/2020  ? Tingling 02/18/2020  ? IUD (intrauterine device) in place 01/27/2020  ? Difficulty sleeping 01/08/2020  ? Insomnia 11/18/2019  ? Prediabetes 04/25/2019  ? Class 1 obesity with serious comorbidity and body mass index (BMI) of 30.0 to 30.9 in adult 04/25/2019  ? Herpes simplex 08/10/2015  ? Eczema 05/11/2015  ? Hypertension   ? HLD (hyperlipidemia)   ? GERD (gastroesophageal reflux disease)   ? Allergic rhinitis   ? Chronic  migraine   ?  ?Allergies: No Known Allergies ?Medications:  ?Current Outpatient Medications:  ?  predniSONE (STERAPRED UNI-PAK 21 TAB) 10 MG (21) TBPK tablet, 6 day taper; take as directed on package instructions, Disp: 21 tablet, Rfl: 0 ?  ALLERGY RELIEF 180 MG tablet, TAKE 1 TABLET BY MOUTH EVERY DAY FOR ALLERGIES OR SINUS, Disp: 90 tablet, Rfl: 3 ?  amLODipine (NORVASC) 10 MG tablet, TAKE 1 TABLET(10 MG) BY MOUTH DAILY, Disp: 90 tablet, Rfl: 1 ?  atorvastatin (LIPITOR) 20 MG tablet, TAKE 1 TABLET AT BEDTIME, Disp: 90 tablet, Rfl: 1 ?  benzonatate (TESSALON) 100 MG  capsule, Take 1 capsule (100 mg total) by mouth 2 (two) times daily as needed for cough., Disp: 20 capsule, Rfl: 0 ?  clobetasol ointment (TEMOVATE) 3.83 %, Apply 1 application topically 2 (two) times daily. Apply to affected area, Disp: 30 g, Rfl: 2 ?  fluticasone (FLONASE) 50 MCG/ACT nasal spray, USE 2 SPRAYS IN EACH       NOSTRIL DAILY., Disp: 48 g, Rfl: 1 ?  levonorgestrel (MIRENA) 20 MCG/24HR IUD, by Intrauterine route. Reported on 12/13/2015, Disp: , Rfl:  ?  metoprolol succinate (TOPROL-XL) 100 MG 24 hr tablet, TAKE 1 TABLET DAILY WITH ORIMMEDIATELY FOLLOWING A    MEAL, Disp: 90 tablet, Rfl: 1 ?  omeprazole (PRILOSEC) 20 MG capsule, Take 1 capsule (20 mg total) by mouth daily before breakfast., Disp: 90 capsule, Rfl: 3 ?  scopolamine (TRANSDERM-SCOP, 1.5 MG,) 1 MG/3DAYS, Place 1 patch (1.5 mg total) onto the skin every 3 (three) days., Disp: 10 patch, Rfl: 12 ? ?Observations/Objective: ?Patient is well-developed, well-nourished in no acute distress.  ?Resting comfortably  ?Head is normocephalic, atraumatic.  ?No labored breathing.  ?Speech is clear and coherent with logical content.  ?Patient is alert and oriented at baseline.  ? ? ?Assessment and Plan: ?1. ETD (Eustachian tube dysfunction), bilateral ?- predniSONE (STERAPRED UNI-PAK 21 TAB) 10 MG (21) TBPK tablet; 6 day taper; take as directed on package instructions  Dispense: 21 tablet; Refill: 0 ? ?- Suspect ETD from recent air travel ?- Continue flonase ?- Add prednisone for possible inflammation of ETD from travel ?- Strict ER precautions discussed ?- Seek in person evaluation if not improving ? ?Follow Up Instructions: ?I discussed the assessment and treatment plan with the patient. The patient was provided an opportunity to ask questions and all were answered. The patient agreed with the plan and demonstrated an understanding of the instructions.  A copy of instructions were sent to the patient via MyChart unless otherwise noted below.  ? ? ?The  patient was advised to call back or seek an in-person evaluation if the symptoms worsen or if the condition fails to improve as anticipated. ? ?Time:  ?I spent 10 minutes with the patient via telehealth technology discussing the above problems/concerns.   ? ?Mar Daring, PA-C ?

## 2021-12-27 ENCOUNTER — Encounter: Payer: Self-pay | Admitting: Family Medicine

## 2021-12-31 ENCOUNTER — Encounter: Payer: Self-pay | Admitting: Family Medicine

## 2021-12-31 ENCOUNTER — Telehealth (INDEPENDENT_AMBULATORY_CARE_PROVIDER_SITE_OTHER): Payer: BC Managed Care – PPO | Admitting: Family Medicine

## 2021-12-31 VITALS — Ht 64.0 in | Wt 160.0 lb

## 2021-12-31 DIAGNOSIS — B9689 Other specified bacterial agents as the cause of diseases classified elsewhere: Secondary | ICD-10-CM | POA: Diagnosis not present

## 2021-12-31 DIAGNOSIS — A6009 Herpesviral infection of other urogenital tract: Secondary | ICD-10-CM

## 2021-12-31 DIAGNOSIS — N760 Acute vaginitis: Secondary | ICD-10-CM | POA: Diagnosis not present

## 2021-12-31 MED ORDER — VALACYCLOVIR HCL 500 MG PO TABS: 500.0000 mg | ORAL_TABLET | Freq: Every day | ORAL | 3 refills | Status: DC

## 2021-12-31 MED ORDER — METRONIDAZOLE 500 MG PO TABS: 500.0000 mg | ORAL_TABLET | Freq: Two times a day (BID) | ORAL | 0 refills | Status: AC

## 2021-12-31 NOTE — Progress Notes (Signed)
? ?Name: Carrie Cross   MRN: [id number]    DOB: 01-23-1976   Date:12/31/2021 ? ?     Progress Note ? ?Subjective:  ? ? ?Chief Complaint ? ?Chief Complaint  ?Patient presents with  ? Discuss Medication  ?  Valacyclovir   ? ? ?I connected with  [name] on 12/31/21 at 11:00 AM EDT by telephone and verified that I am speaking with the correct person using two identifiers. ? ?Virtual encounter would not work due to computer issues (my computer froze and had to be shut down and restarted) we proceeded with telephone encounter ?  ?I discussed the limitations, risks, security and privacy concerns of performing an evaluation and management service by telephone and the availability of in person appointments. Staff also discussed with the patient that there may be a patient responsible charge related to this service.  Patient verbalized understanding and agreed to proceed with encounter. ?Patient Location: work ?Provider Location: [facility] office ?Additional Individuals present: none ? ?HPI ? ?Pt has hx of genital herpes outbreaks, used to be occasionally, hasn't needed Rx for years ?Recently more frequent outbreaks - frequent but not quite once a month ?Husband has the same and is on suppressive valtrex, she would like to try doing that to avoid the outbreaks ? ?Also recurrent vaginitis/vaginosis ?She is concerned for current BV - odor irritation ?Would like flagyl oral to tx ? ? ? ?Patient Active Problem List  ? Diagnosis Date Noted  ? Colon cancer screening   ? Adenomatous polyp of sigmoid colon   ? Inflammatory arthritis 05/02/2021  ? Diabetes ([place]) 05/02/2021  ? Muscle cramps 02/18/2020  ? Tingling 02/18/2020  ? IUD (intrauterine device) in place 01/27/2020  ? Difficulty sleeping 01/08/2020  ? Insomnia 11/18/2019  ? Prediabetes 04/25/2019  ? Class 1 obesity with serious comorbidity and body mass index (BMI) of 30.0 to 30.9 in adult 04/25/2019  ? Herpes simplex 08/10/2015  ? Eczema 05/11/2015  ?  Hypertension   ? HLD (hyperlipidemia)   ? GERD (gastroesophageal reflux disease)   ? Allergic rhinitis   ? Chronic migraine   ? ? ?Social History  ? ?Tobacco Use  ? Smoking status: Former  ?  Packs/day: 0.50  ?  Years: 7.00  ?  Pack years: 3.50  ?  Types: Cigarettes  ?  Quit date: 06/02/2011  ?  Years since quitting: 10.5  ? Smokeless tobacco: Never  ?Substance Use Topics  ? Alcohol use: Yes  ?  Alcohol/week: 3.0 standard drinks  ?  Types: 3 Glasses of wine per week  ? ? ? ?Current Outpatient Medications:  ?  ALLERGY RELIEF 180 MG tablet, TAKE 1 TABLET BY MOUTH EVERY DAY FOR ALLERGIES OR SINUS, Disp: 90 tablet, Rfl: 3 ?  amLODipine (NORVASC) 10 MG tablet, TAKE 1 TABLET(10 MG) BY MOUTH DAILY, Disp: 90 tablet, Rfl: 1 ?  atorvastatin (LIPITOR) 20 MG tablet, TAKE 1 TABLET AT BEDTIME, Disp: 90 tablet, Rfl: 1 ?  clobetasol ointment (TEMOVATE) 3.26 %, Apply 1 application topically 2 (two) times daily. Apply to affected area, Disp: 30 g, Rfl: 2 ?  fluticasone (FLONASE) 50 MCG/ACT nasal spray, USE 2 SPRAYS IN EACH       NOSTRIL DAILY., Disp: 48 g, Rfl: 1 ?  levonorgestrel (MIRENA) 20 MCG/24HR IUD, by Intrauterine route. Reported on 12/13/2015, Disp: , Rfl:  ?  metoprolol succinate (TOPROL-XL) 100 MG 24 hr tablet, TAKE 1 TABLET DAILY WITH ORIMMEDIATELY FOLLOWING A    MEAL, Disp:  90 tablet, Rfl: 1 ?  metroNIDAZOLE (FLAGYL) 500 MG tablet, Take 1 tablet (500 mg total) by mouth 2 (two) times daily for 7 days., Disp: 14 tablet, Rfl: 0 ?  omeprazole (PRILOSEC) 20 MG capsule, Take 1 capsule (20 mg total) by mouth daily before breakfast., Disp: 90 capsule, Rfl: 3 ?  predniSONE (STERAPRED UNI-PAK 21 TAB) 10 MG (21) TBPK tablet, 6 day taper; take as directed on package instructions, Disp: 21 tablet, Rfl: 0 ?  scopolamine (TRANSDERM-SCOP, 1.5 MG,) 1 MG/3DAYS, Place 1 patch (1.5 mg total) onto the skin every 3 (three) days., Disp: 10 patch, Rfl: 12 ?  valACYclovir (VALTREX) 500 MG tablet, Take 1 tablet (500 mg total) by mouth daily., Disp:  90 tablet, Rfl: 3 ? ?No Known Allergies ? ?Chart Review: ?I personally reviewed active problem list, medication list, allergies, family history, social history, health maintenance, notes from last encounter, lab results, imaging with the patient/caregiver today. ? ? ?Review of Systems  ?Constitutional: Negative.   ?HENT: Negative.    ?Eyes: Negative.   ?Respiratory: Negative.    ?Cardiovascular: Negative.   ?Gastrointestinal: Negative.   ?Endocrine: Negative.   ?Genitourinary: Negative.   ?Musculoskeletal: Negative.   ?Skin: Negative.   ?Allergic/Immunologic: Negative.   ?Neurological: Negative.   ?Hematological: Negative.   ?Psychiatric/Behavioral: Negative.    ?All other systems reviewed and are negative. ? ? ?Objective:  ? ? ?Virtual encounter, vitals limited, only able to obtain the following ?Today's Vitals  ? 12/31/21 1043  ?Weight: 160 lb (72.6 kg)  ?Height: '5\' 4"'$  (1.626 m)  ? ?Body mass index is 27.46 kg/m?Marland Kitchen ?Nursing Note and Vital Signs reviewed. ? ?Physical Exam ?Vitals and nursing note reviewed.  ?Pulmonary:  ?   Effort: No respiratory distress.  ?Neurological:  ?   Mental Status: She is alert.  ? ? ?PE limited by telephone encounter ? ?No results found for this or any previous visit (from the past 72 hour(s)). ? ?Assessment and Plan:  ? ?  ICD-10-CM   ?1. Herpes genitalis in women  A60.09 valACYclovir (VALTREX) 500 MG tablet  ? more frequent, change from abortive tx to suppressive/daily tx  ?  ?2. BV (bacterial vaginosis)  N76.0 metroNIDAZOLE (FLAGYL) 500 MG tablet  ? B96.89   ? tx clinically based on sx, pt suspicions and her hx, encouraged her to come for vag swab is not improving  ?  ? ?She has appt in June we will f/up on effectiveness of valtrex 500 - may need to increase to 1000 mg daily dose ?Encouraged her to f/up sooner if either problem is not improving ? ?-Red flags and when to present for emergency care or RTC including but not limited to new/worsening/un-resolving symptoms,  reviewed with  patient at time of visit. Follow up and care instructions discussed and provided in AVS. ?- I discussed the assessment and treatment plan with the patient. The patient was provided an opportunity to ask questions and all were answered. The patient agreed with the plan and demonstrated an understanding of the instructions. ? - The patient was advised to call back or seek an in-person evaluation if the symptoms worsen or if the condition fails to improve as anticipated. ? ?I provided 20 minutes of non-face-to-face time during this encounter. ? ?Delsa Grana, PA-C ?12/31/21 12:20 PM ? ?

## 2022-02-13 ENCOUNTER — Encounter: Payer: Self-pay | Admitting: Family Medicine

## 2022-02-13 ENCOUNTER — Ambulatory Visit: Payer: BC Managed Care – PPO | Admitting: Family Medicine

## 2022-02-13 VITALS — BP 134/78 | HR 84 | Temp 98.1°F | Resp 16 | Ht 64.0 in | Wt 163.1 lb

## 2022-02-13 DIAGNOSIS — I10 Essential (primary) hypertension: Secondary | ICD-10-CM | POA: Diagnosis not present

## 2022-02-13 DIAGNOSIS — E782 Mixed hyperlipidemia: Secondary | ICD-10-CM | POA: Diagnosis not present

## 2022-02-13 DIAGNOSIS — E119 Type 2 diabetes mellitus without complications: Secondary | ICD-10-CM

## 2022-02-13 DIAGNOSIS — B009 Herpesviral infection, unspecified: Secondary | ICD-10-CM

## 2022-02-13 DIAGNOSIS — K219 Gastro-esophageal reflux disease without esophagitis: Secondary | ICD-10-CM

## 2022-02-13 NOTE — Progress Notes (Signed)
Name: Carrie Cross   MRN: [id number]    DOB: 30-Apr-1976   Date:02/13/2022       Progress Note  Chief Complaint  Patient presents with   Follow-up   Hypertension   Hyperlipidemia   Gastroesophageal Reflux   Diabetes     Subjective:   Carrie Cross is a 46 y.o. female, presents to clinic for routine f/up  GERD on PPI for years, sx daily if she is off meds, no prior GI consult or EGD  Hypertension:  Currently managed on Norvasc 10 mg and metoprolol XL 100 Pt reports good med compliance and denies any SE.   Blood pressure today is well controlled. BP Readings from Last 3 Encounters:  02/13/22 134/78  10/25/21 (!) 162/86  08/13/21 128/64   Pt denies CP, SOB, exertional sx, LE edema, palpitation, Ha's, visual disturbances, lightheadedness, hypotension, syncope. Dietary efforts for BP?  Trying to be healthy Hyperlipidemia: Currently treated with Lipitor 20 mg, pt reports good med compliance Last Lipids: Lab Results  Component Value Date   CHOL 125 05/02/2021   HDL 57 05/02/2021   LDLCALC 56 05/02/2021   TRIG 41 05/02/2021   CHOLHDL 2.2 05/02/2021   - Denies: Chest pain, shortness of breath, myalgias, claudication  DM/prediabetes Pt managing DM with diet and lifestyle Denies: Polyuria, polydipsia, vision changes, neuropathy, hypoglycemia Recent pertinent labs: Lab Results  Component Value Date   HGBA1C 5.9 (H) 05/02/2021   HGBA1C 6.5 (H) 01/31/2021   HGBA1C 6.2 (H) 05/21/2020   Lab Results  Component Value Date   MICROALBUR 1.4 05/02/2021   LDLCALC 56 05/02/2021   CREATININE 0.76 05/02/2021   GERD-patient states she is been on PPI for many years and she has not had a GI consult she endorses reflux, indigestion, inability to wean off PPI denies any dysphagia   Current Outpatient Medications:    ALLERGY RELIEF 180 MG tablet, TAKE 1 TABLET BY MOUTH EVERY DAY FOR ALLERGIES OR SINUS, Disp: 90 tablet, Rfl: 3   amLODipine (NORVASC) 10 MG tablet, TAKE 1  TABLET(10 MG) BY MOUTH DAILY, Disp: 90 tablet, Rfl: 1   atorvastatin (LIPITOR) 20 MG tablet, TAKE 1 TABLET AT BEDTIME, Disp: 90 tablet, Rfl: 1   clobetasol ointment (TEMOVATE) 7.06 %, Apply 1 application topically 2 (two) times daily. Apply to affected area, Disp: 30 g, Rfl: 2   fluticasone (FLONASE) 50 MCG/ACT nasal spray, USE 2 SPRAYS IN EACH       NOSTRIL DAILY., Disp: 48 g, Rfl: 1   levonorgestrel (MIRENA) 20 MCG/24HR IUD, by Intrauterine route. Reported on 12/13/2015, Disp: , Rfl:    metoprolol succinate (TOPROL-XL) 100 MG 24 hr tablet, TAKE 1 TABLET DAILY WITH ORIMMEDIATELY FOLLOWING A    MEAL, Disp: 90 tablet, Rfl: 1   omeprazole (PRILOSEC) 20 MG capsule, Take 1 capsule (20 mg total) by mouth daily before breakfast., Disp: 90 capsule, Rfl: 3   valACYclovir (VALTREX) 500 MG tablet, Take 1 tablet (500 mg total) by mouth daily., Disp: 90 tablet, Rfl: 3   predniSONE (STERAPRED UNI-PAK 21 TAB) 10 MG (21) TBPK tablet, 6 day taper; take as directed on package instructions (Patient not taking: Reported on 02/13/2022), Disp: 21 tablet, Rfl: 0   scopolamine (TRANSDERM-SCOP, 1.5 MG,) 1 MG/3DAYS, Place 1 patch (1.5 mg total) onto the skin every 3 (three) days. (Patient not taking: Reported on 02/13/2022), Disp: 10 patch, Rfl: 12  Patient Active Problem List   Diagnosis Date Noted   Colon cancer screening  Adenomatous polyp of sigmoid colon    Inflammatory arthritis 05/02/2021   Diabetes ([place]) 05/02/2021   Muscle cramps 02/18/2020   Tingling 02/18/2020   IUD (intrauterine device) in place 01/27/2020   Difficulty sleeping 01/08/2020   Insomnia 11/18/2019   Prediabetes 04/25/2019   Class 1 obesity with serious comorbidity and body mass index (BMI) of 30.0 to 30.9 in adult 04/25/2019   Herpes simplex 08/10/2015   Eczema 05/11/2015   Hypertension    HLD (hyperlipidemia)    GERD (gastroesophageal reflux disease)    Allergic rhinitis    Chronic migraine     Past Surgical History:  Procedure  Laterality Date   BACK SURGERY  [id number]   disc shaved down   CERVICAL DISC ARTHROPLASTY  [id number]   COLONOSCOPY WITH PROPOFOL N/A 10/25/2021   Procedure: COLONOSCOPY WITH PROPOFOL;  Surgeon: Lin Landsman, MD;  Location: [facility];  Service: Gastroenterology;  Laterality: N/A;    Family History  Problem Relation Age of Onset   Breast cancer Maternal Grandmother 63   Heart disease Maternal Grandfather    Stroke Neg Hx    Diabetes Neg Hx    Ovarian cancer Neg Hx    Colon cancer Neg Hx     Social History   Tobacco Use   Smoking status: Former    Packs/day: 0.50    Years: 7.00    Total pack years: 3.50    Types: Cigarettes    Quit date: 06/02/2011    Years since quitting: 10.7   Smokeless tobacco: Never  Vaping Use   Vaping Use: Never used  Substance Use Topics   Alcohol use: Yes    Alcohol/week: 3.0 standard drinks of alcohol    Types: 3 Glasses of wine per week   Drug use: No     No Known Allergies  Health Maintenance  Topic Date Due   OPHTHALMOLOGY EXAM  Never done   HEMOGLOBIN A1C  10/30/2021   COVID-19 Vaccine (5 - Booster for Moderna series) 03/01/2022 (Originally 07/26/2021)   INFLUENZA VACCINE  04/01/2022   URINE MICROALBUMIN  05/02/2022   MAMMOGRAM  07/15/2022   FOOT EXAM  08/13/2022   PAP SMEAR-Modifier  02/01/2024   COLONOSCOPY (Pts 45-36yr Insurance coverage will need to be confirmed)  10/25/2024   TETANUS/TDAP  08/07/2030   Hepatitis C Screening  Completed   HIV Screening  Addressed   HPV VACCINES  Aged Out    Chart Review Today: I personally reviewed active problem list, medication list, allergies, family history, social history, health maintenance, notes from last encounter, lab results, imaging with the patient/caregiver today.    Review of Systems  Constitutional: Negative.   HENT: Negative.    Eyes: Negative.   Respiratory: Negative.    Cardiovascular: Negative.   Gastrointestinal: Negative.   Endocrine: Negative.    Genitourinary: Negative.   Musculoskeletal: Negative.   Skin: Negative.   Allergic/Immunologic: Negative.   Neurological: Negative.   Hematological: Negative.   Psychiatric/Behavioral: Negative.    All other systems reviewed and are negative.    Objective:   Vitals:   02/13/22 1034  BP: 134/78  Pulse: 84  Resp: 16  Temp: 98.1 F (36.7 C)  TempSrc: Oral  SpO2: 99%  Weight: 163 lb 1.6 oz (74 kg)  Height: '5\' 4"'$  (1.626 m)    Body mass index is 28 kg/m.  Physical Exam Vitals and nursing note reviewed.  Constitutional:      General: She is not in acute distress.  Appearance: Normal appearance. She is well-developed and normal weight. She is not ill-appearing, toxic-appearing or diaphoretic.     Interventions: Face mask in place.  HENT:     Head: Normocephalic and atraumatic.     Right Ear: External ear normal.     Left Ear: External ear normal.     Nose: Nose normal.     Mouth/Throat:     Mouth: Mucous membranes are moist.     Pharynx: Oropharynx is clear.  Eyes:     General: Lids are normal. No scleral icterus.       Right eye: No discharge.        Left eye: No discharge.     Conjunctiva/sclera: Conjunctivae normal.  Neck:     Trachea: Phonation normal. No tracheal deviation.  Cardiovascular:     Rate and Rhythm: Normal rate and regular rhythm.     Pulses: Normal pulses.          Radial pulses are 2+ on the right side and 2+ on the left side.       Posterior tibial pulses are 2+ on the right side and 2+ on the left side.     Heart sounds: Normal heart sounds. No murmur heard.    No friction rub. No gallop.  Pulmonary:     Effort: Pulmonary effort is normal. No respiratory distress.     Breath sounds: Normal breath sounds. No stridor. No wheezing, rhonchi or rales.  Chest:     Chest wall: No tenderness.  Abdominal:     General: Bowel sounds are normal. There is no distension.     Palpations: Abdomen is soft.     Tenderness: There is no abdominal  tenderness.  Musculoskeletal:     Cervical back: Normal range of motion and neck supple.     Right lower leg: No edema.     Left lower leg: No edema.  Skin:    General: Skin is warm and dry.     Coloration: Skin is not jaundiced or pale.     Findings: No rash.  Neurological:     Mental Status: She is alert.     Motor: No abnormal muscle tone.     Gait: Gait normal.  Psychiatric:        Mood and Affect: Mood normal.        Speech: Speech normal.        Behavior: Behavior normal.         Assessment & Plan:   Problem List Items Addressed This Visit       Cardiovascular and Mediastinum   Hypertension    Blood pressure controlled, BP at goal today continue Norvasc and metoprolol BP Readings from Last 3 Encounters:  02/13/22 134/78  10/25/21 (!) 162/86  08/13/21 128/64         Relevant Orders   COMPLETE METABOLIC PANEL WITH GFR (Completed)     Digestive   GERD (gastroesophageal reflux disease)    Patient endorses many years of GERD symptoms and long-term PPI use without any prior GI consult      Relevant Orders   Ambulatory referral to Gastroenterology     Endocrine   Diabetes ([place]) - Primary    New onset last year with a max A1c of 6.5 but improved with diet and lifestyle, has not required medications and I do suspect that she is prediabetic and not all of the diabetes care has been done or initiated because of her improvement We will continue  to monitor her A1c's She is on a statin      Relevant Orders   Hemoglobin A1c (Completed)   COMPLETE METABOLIC PANEL WITH GFR (Completed)   Lipid panel (Completed)     Other   HLD (hyperlipidemia)    Compliant with statin, no side effects or concerns      Relevant Orders   COMPLETE METABOLIC PANEL WITH GFR (Completed)   Lipid panel (Completed)   Herpes simplex    She has only had 1 recent outbreak and recently asked for refill at this point she is unsure if she is going to take it daily to prevent outbreaks or  if she will still wait and see if she has rare and infrequent outbreaks and take it abortively        Return in about 6 months (around 08/15/2022) for Physical.   Delsa Grana, PA-C 02/13/22 11:11 AM

## 2022-02-14 ENCOUNTER — Other Ambulatory Visit: Payer: Self-pay | Admitting: Family Medicine

## 2022-02-14 ENCOUNTER — Other Ambulatory Visit: Payer: Self-pay | Admitting: Unknown Physician Specialty

## 2022-02-14 DIAGNOSIS — E782 Mixed hyperlipidemia: Secondary | ICD-10-CM

## 2022-02-14 DIAGNOSIS — K219 Gastro-esophageal reflux disease without esophagitis: Secondary | ICD-10-CM

## 2022-02-14 LAB — COMPLETE METABOLIC PANEL WITH GFR
AG Ratio: 1.8 (calc) (ref 1.0–2.5)
ALT: 19 U/L (ref 6–29)
AST: 16 U/L (ref 10–35)
Albumin: 4.7 g/dL (ref 3.6–5.1)
Alkaline phosphatase (APISO): 72 U/L (ref 31–125)
BUN: 15 mg/dL (ref 7–25)
CO2: 25 mmol/L (ref 20–32)
Calcium: 9.1 mg/dL (ref 8.6–10.2)
Chloride: 104 mmol/L (ref 98–110)
Creat: 0.61 mg/dL (ref 0.50–0.99)
Globulin: 2.6 g/dL (calc) (ref 1.9–3.7)
Glucose, Bld: 85 mg/dL (ref 65–99)
Potassium: 3.8 mmol/L (ref 3.5–5.3)
Sodium: 140 mmol/L (ref 135–146)
Total Bilirubin: 0.3 mg/dL (ref 0.2–1.2)
Total Protein: 7.3 g/dL (ref 6.1–8.1)
eGFR: 112 mL/min/{1.73_m2} (ref >=60)

## 2022-02-14 LAB — LIPID PANEL
Cholesterol: 150 mg/dL (ref <200)
HDL: 72 mg/dL (ref >=50)
LDL Cholesterol (Calc): 65 mg/dL (calc)
Non-HDL Cholesterol (Calc): 78 mg/dL (calc) (ref <130)
Total CHOL/HDL Ratio: 2.1 (calc) (ref <5.0)
Triglycerides: 53 mg/dL (ref <150)

## 2022-02-14 LAB — HEMOGLOBIN A1C
Hgb A1c MFr Bld: 6.1 % of total Hgb — ABNORMAL HIGH (ref <5.7)
Mean Plasma Glucose: 128 mg/dL
eAG (mmol/L): 7.1 mmol/L

## 2022-02-14 MED ORDER — ATORVASTATIN CALCIUM 20 MG PO TABS: 20.0000 mg | ORAL_TABLET | Freq: Every day | ORAL | 3 refills | Status: DC

## 2022-02-17 NOTE — Telephone Encounter (Signed)
Requested Prescriptions  Pending Prescriptions Disp Refills  . omeprazole (PRILOSEC) 20 MG capsule [Pharmacy Med Name: OMEPRAZOL RX CAP '20MG'$ ] 90 capsule 3    Sig: TAKE Clifford     Gastroenterology: Proton Pump Inhibitors Passed - 02/14/2022  7:16 PM      Passed - Valid encounter within last 12 months    Recent Outpatient Visits          4 days ago Type 2 diabetes mellitus without complication, without long-term current use of insulin [facility])   [facility] [place], Kristeen Miss, PA-C   1 month ago Herpes genitalis in women   [facility] [place], Kristeen Miss, PA-C   6 months ago Type 2 diabetes mellitus without complication, without long-term current use of insulin [facility])   [facility] Teodora Medici, DO   9 months ago Need for influenza vaccination   [facility] Kathrine Haddock, NP   1 year ago Pain in right finger(s)   [facility] Delsa Grana, PA-C      Future Appointments            In 6 months Steele Sizer, MD [facility], [facility]

## 2022-02-20 NOTE — Assessment & Plan Note (Signed)
She has only had 1 recent outbreak and recently asked for refill at this point she is unsure if she is going to take it daily to prevent outbreaks or if she will still wait and see if she has rare and infrequent outbreaks and take it abortively

## 2022-02-20 NOTE — Assessment & Plan Note (Signed)
Compliant with statin, no side effects or concerns

## 2022-02-20 NOTE — Assessment & Plan Note (Signed)
Blood pressure controlled, BP at goal today continue Norvasc and metoprolol BP Readings from Last 3 Encounters:  02/13/22 134/78  10/25/21 (!) 162/86  08/13/21 128/64

## 2022-02-20 NOTE — Assessment & Plan Note (Signed)
Patient endorses many years of GERD symptoms and long-term PPI use without any prior GI consult

## 2022-02-20 NOTE — Assessment & Plan Note (Signed)
New onset last year with a max A1c of 6.5 but improved with diet and lifestyle, has not required medications and I do suspect that she is prediabetic and not all of the diabetes care has been done or initiated because of her improvement We will continue to monitor her A1c's She is on a statin

## 2022-03-31 ENCOUNTER — Other Ambulatory Visit: Payer: Self-pay | Admitting: Family Medicine

## 2022-03-31 DIAGNOSIS — E782 Mixed hyperlipidemia: Secondary | ICD-10-CM

## 2022-04-14 ENCOUNTER — Other Ambulatory Visit: Payer: Self-pay | Admitting: Family Medicine

## 2022-04-14 DIAGNOSIS — I10 Essential (primary) hypertension: Secondary | ICD-10-CM

## 2022-06-02 ENCOUNTER — Other Ambulatory Visit: Payer: Self-pay | Admitting: Family Medicine

## 2022-06-02 DIAGNOSIS — Z1231 Encounter for screening mammogram for malignant neoplasm of breast: Secondary | ICD-10-CM

## 2022-07-05 ENCOUNTER — Telehealth: Payer: BC Managed Care – PPO | Admitting: Nurse Practitioner

## 2022-07-05 DIAGNOSIS — R21 Rash and other nonspecific skin eruption: Secondary | ICD-10-CM | POA: Diagnosis not present

## 2022-07-05 DIAGNOSIS — L308 Other specified dermatitis: Secondary | ICD-10-CM | POA: Diagnosis not present

## 2022-07-05 NOTE — Progress Notes (Signed)
E Visit for Rash  We are sorry that you are not feeling well. Here is how we plan to help!  Based on what you shared with me it looks like you have contact dermatitis.  Contact dermatitis is a skin rash caused by something that touches the skin and causes irritation or inflammation.  Your skin may be red, swollen, dry, cracked, and itch.  The rash should go away in a few days but can last a few weeks.  If you get a rash, it's important to figure out what caused it so the irritant can be avoided in the future.  I recommend you take Benadryl 25 mg - 50 mg every 4 hours to control the symptoms but if they last over 24 hours it is best that you see an office based provider for follow up.   HOME CARE:  Take cool showers and avoid direct sunlight. Apply cool compress or wet dressings. Take a bath in an oatmeal bath.  Sprinkle content of one Aveeno packet under running faucet with comfortably warm water.  Bathe for 15-20 minutes, 1-2 times daily.  Pat dry with a towel. Do not rub the rash. Use hydrocortisone cream. Take an antihistamine like Benadryl for widespread rashes that itch.  The adult dose of Benadryl is 25-50 mg by mouth 4 times daily. Caution:  This type of medication may cause sleepiness.  Do not drink alcohol, drive, or operate dangerous machinery while taking antihistamines.  Do not take these medications if you have prostate enlargement.  Read package instructions thoroughly on all medications that you take.  GET HELP RIGHT AWAY IF:  Symptoms don't go away after treatment. Severe itching that persists. If you rash spreads or swells. If you rash begins to smell. If it blisters and opens or develops a yellow-brown crust. You develop a fever. You have a sore throat. You become short of breath.  MAKE SURE YOU:  Understand these instructions. Will watch your condition. Will get help right away if you are not doing well or get worse.  Thank you for choosing an e-visit.  Your  e-visit answers were reviewed by a board certified advanced clinical practitioner to complete your personal care plan. Depending upon the condition, your plan could have included both over the counter or prescription medications.  Please review your pharmacy choice. Make sure the pharmacy is open so you can pick up prescription now. If there is a problem, you may contact your provider through [employer] and have the prescription routed to another pharmacy.  Your safety is important to [place]. If you have drug allergies check your prescription carefully.   For the next 24 hours you can use MyChart to ask questions about today's visit, request a non-urgent call back, or ask for a work or school excuse. You will get an email in the next two days asking about your experience. I hope that your e-visit has been valuable and will speed your recovery.  Mary-Margaret Hassell Done, FNP   5-10 minutes spent reviewing and documenting in chart.

## 2022-07-07 ENCOUNTER — Other Ambulatory Visit: Payer: Self-pay

## 2022-07-07 ENCOUNTER — Ambulatory Visit: Payer: BC Managed Care – PPO | Admitting: Gastroenterology

## 2022-07-07 ENCOUNTER — Encounter: Payer: Self-pay | Admitting: Gastroenterology

## 2022-07-07 VITALS — BP 165/93 | HR 81 | Temp 99.5°F | Ht 64.0 in | Wt 164.2 lb

## 2022-07-07 DIAGNOSIS — K219 Gastro-esophageal reflux disease without esophagitis: Secondary | ICD-10-CM | POA: Diagnosis not present

## 2022-07-07 NOTE — Patient Instructions (Signed)
Gastroesophageal Reflux Disease, Adult  Gastroesophageal reflux (GER) happens when acid from the stomach flows up into the tube that connects the mouth and the stomach (esophagus). Normally, food travels down the esophagus and stays in the stomach to be digested. With GER, food and stomach acid sometimes move back up into the esophagus. You may have a disease called gastroesophageal reflux disease (GERD) if the reflux: Happens often. Causes frequent or very bad symptoms. Causes problems such as damage to the esophagus. When this happens, the esophagus becomes sore and swollen. Over time, GERD can make small holes (ulcers) in the lining of the esophagus. What are the causes? This condition is caused by a problem with the muscle between the esophagus and the stomach. When this muscle is weak or not normal, it does not close properly to keep food and acid from coming back up from the stomach. The muscle can be weak because of: Tobacco use. Pregnancy. Having a certain type of hernia (hiatal hernia). Alcohol use. Certain foods and drinks, such as coffee, chocolate, onions, and peppermint. What increases the risk? Being overweight. Having a disease that affects your connective tissue. Taking NSAIDs, such a ibuprofen. What are the signs or symptoms? Heartburn. Difficult or painful swallowing. The feeling of having a lump in the throat. A bitter taste in the mouth. Bad breath. Having a lot of saliva. Having an upset or bloated stomach. Burping. Chest pain. Different conditions can cause chest pain. Make sure you see your doctor if you have chest pain. Shortness of breath or wheezing. A long-term cough or a cough at night. Wearing away of the surface of teeth (tooth enamel). Weight loss. How is this treated? Making changes to your diet. Taking medicine. Having surgery. Treatment will depend on how bad your symptoms are. Follow these instructions at home: Eating and drinking  Follow a  diet as told by your doctor. You may need to avoid foods and drinks such as: Coffee and tea, with or without caffeine. Drinks that contain alcohol. Energy drinks and sports drinks. Bubbly (carbonated) drinks or sodas. Chocolate and cocoa. Peppermint and mint flavorings. Garlic and onions. Horseradish. Spicy and acidic foods. These include peppers, chili powder, curry powder, vinegar, hot sauces, and BBQ sauce. Citrus fruit juices and citrus fruits, such as oranges, lemons, and limes. Tomato-based foods. These include red sauce, chili, salsa, and pizza with red sauce. Fried and fatty foods. These include donuts, french fries, potato chips, and high-fat dressings. High-fat meats. These include hot dogs, rib eye steak, sausage, ham, and bacon. High-fat dairy items, such as whole milk, butter, and cream cheese. Eat small meals often. Avoid eating large meals. Avoid drinking large amounts of liquid with your meals. Avoid eating meals during the 2-3 hours before bedtime. Avoid lying down right after you eat. Do not exercise right after you eat. Lifestyle  Do not smoke or use any products that contain nicotine or tobacco. If you need help quitting, ask your doctor. Try to lower your stress. If you need help doing this, ask your doctor. If you are overweight, lose an amount of weight that is healthy for you. Ask your doctor about a safe weight loss goal. General instructions Pay attention to any changes in your symptoms. Take over-the-counter and prescription medicines only as told by your doctor. Do not take aspirin, ibuprofen, or other NSAIDs unless your doctor says it is okay. Wear loose clothes. Do not wear anything tight around your waist. Raise (elevate) the head of your bed about   6 inches (15 cm). You may need to use a wedge to do this. Avoid bending over if this makes your symptoms worse. Keep all follow-up visits. Contact a doctor if: You have new symptoms. You lose weight and you  do not know why. You have trouble swallowing or it hurts to swallow. You have wheezing or a cough that keeps happening. You have a hoarse voice. Your symptoms do not get better with treatment. Get help right away if: You have sudden pain in your arms, neck, jaw, teeth, or back. You suddenly feel sweaty, dizzy, or light-headed. You have chest pain or shortness of breath. You vomit and the vomit is green, yellow, or black, or it looks like blood or coffee grounds. You faint. Your poop (stool) is red, bloody, or black. You cannot swallow, drink, or eat. These symptoms may represent a serious problem that is an emergency. Do not wait to see if the symptoms will go away. Get medical help right away. Call your local emergency services (911 in the [place] Do not drive yourself to the hospital. Summary If a person has gastroesophageal reflux disease (GERD), food and stomach acid move back up into the esophagus and cause symptoms or problems such as damage to the esophagus. Treatment will depend on how bad your symptoms are. Follow a diet as told by your doctor. Take all medicines only as told by your doctor. This information is not intended to replace advice given to you by your health care provider. Make sure you discuss any questions you have with your health care provider. Document Revised: 02/27/2020 Document Reviewed: 02/27/2020 Elsevier Patient Education  2023 [employer]  

## 2022-07-07 NOTE — Progress Notes (Unsigned)
Carrie Darby, MD [address]  [place]  [place], [place] [address]  Main: [phone]  Fax: [phone]    Gastroenterology Consultation  Referring Provider:     Delsa Grana, PA-C Primary Care Physician:  Delsa Grana, PA-C Primary Gastroenterologist:  Dr. Cephas Cross Reason for Consultation: Chronic GERD        HPI:   Carrie Cross is a 46 y.o. female referred by Delsa Grana, PA-C  for consultation & management of chronic GERD.  Patient has been experiencing several years history of heartburn for which she takes omeprazole 20 mg daily which keeps her symptoms under control.  She is not able to come off omeprazole.  She tried to switch to Pepcid which resulted in recurrence of heartburn.  She denies any difficulty swallowing or epigastric pain She does drink 1 cup of coffee daily She does not smoke  NSAIDs: None  Antiplts/Anticoagulants/Anti thrombotics: None  GI Procedures:  Colonoscopy 10/25/2021 - One 12 mm polyp in the sigmoid colon, removed with a hot snare. Resected and retrieved. Clips (MR conditional) were placed. - One 8 mm polyp in the sigmoid colon, removed with a hot snare. Resected and retrieved. - One 6 mm polyp in the sigmoid colon, removed with a cold snare. Resected and retrieved. - The distal rectum and anal verge are normal on retroflexion view.  DIAGNOSIS:  A. COLON POLYPS X3, SIGMOID; HOT SNARE:  - TUBULOVILLOUS ADENOMA (X1), LARGEST FRAGMENT.  - MULTIPLE FRAGMENTS OF TUBULAR ADENOMAS.  - CAUTERIZED BASE OF LARGEST POLYP FRAGMENT FREE OF DYSPLASIA; LOW-GRADE DYSPLASIA PRESENT AT CAUTERIZED EDGES OF SMALLER POLYP FRAGMENTS.  - NEGATIVE FOR HIGH-GRADE DYSPLASIA AND MALIGNANCY.   Past Medical History:  Diagnosis Date   Allergic rhinitis    Carpal tunnel syndrome    Chronic migraine    GERD (gastroesophageal reflux disease)    Hx of pyelonephritis    as a child   Hyperlipidemia    Hypertension    Controlled per pt   IFG  (impaired fasting glucose)    Irregular menstruation     Past Surgical History:  Procedure Laterality Date   BACK SURGERY  [id number]   disc shaved down   CERVICAL DISC ARTHROPLASTY  [id number]   COLONOSCOPY WITH PROPOFOL N/A 10/25/2021   Procedure: COLONOSCOPY WITH PROPOFOL;  Surgeon: Lin Landsman, MD;  Location[facility] ENDOSCOPY;  Service: Gastroenterology;  Laterality: N/A;     Current Outpatient Medications:    acyclovir cream (ZOVIRAX) 5 %, Apply 1 Application topically every 3 (three) hours., Disp: , Rfl:    ALLERGY RELIEF 180 MG tablet, TAKE 1 TABLET BY MOUTH EVERY DAY FOR ALLERGIES OR SINUS, Disp: 90 tablet, Rfl: 3   amLODipine (NORVASC) 10 MG tablet, TAKE 1 TABLET(10 MG) BY MOUTH DAILY, Disp: 90 tablet, Rfl: 1   atorvastatin (LIPITOR) 20 MG tablet, TAKE 1 TABLET AT BEDTIME, Disp: 90 tablet, Rfl: 1   clobetasol ointment (TEMOVATE) 0.76 %, Apply 1 application topically 2 (two) times daily. Apply to affected area, Disp: 30 g, Rfl: 2   fluticasone (FLONASE) 50 MCG/ACT nasal spray, USE 2 SPRAYS IN EACH       NOSTRIL DAILY., Disp: 48 g, Rfl: 1   levonorgestrel (MIRENA) 20 MCG/24HR IUD, by Intrauterine route. Reported on 12/13/2015, Disp: , Rfl:    metoprolol succinate (TOPROL-XL) 100 MG 24 hr tablet, TAKE 1 TABLET DAILY WITH ORIMMEDIATELY FOLLOWING A    MEAL, Disp: 90 tablet, Rfl: 1   omeprazole (PRILOSEC) 20 MG  capsule, TAKE 1 CAPSULE DAILY BEFOREBREAKFAST, Disp: 90 capsule, Rfl: 3   Family History  Problem Relation Age of Onset   Breast cancer Maternal Grandmother 80   Heart disease Maternal Grandfather    Stroke Neg Hx    Diabetes Neg Hx    Ovarian cancer Neg Hx    Colon cancer Neg Hx      Social History   Tobacco Use   Smoking status: Former    Packs/day: 0.50    Years: 7.00    Total pack years: 3.50    Types: Cigarettes    Quit date: 06/02/2011    Years since quitting: 11.1   Smokeless tobacco: Never  Vaping Use   Vaping Use: Never used  Substance Use Topics    Alcohol use: Yes    Alcohol/week: 3.0 standard drinks of alcohol    Types: 3 Glasses of wine per week   Drug use: No    Allergies as of 07/07/2022   (No Known Allergies)    Review of Systems:    All systems reviewed and negative except where noted in HPI.   Physical Exam:  BP (!) 165/93 (BP Location: Left Arm, Patient Position: Sitting, Cuff Size: Normal)   Pulse 81   Temp 99.5 F (37.5 C) (Oral)   Ht '5\' 4"'$  (1.626 m)   Wt 164 lb 4 oz (74.5 kg)   BMI 28.19 kg/m  No LMP recorded. (Menstrual status: IUD).  General:   Alert,  Well-developed, well-nourished, pleasant and cooperative in NAD Head:  Normocephalic and atraumatic. Eyes:  Sclera clear, no icterus.   Conjunctiva pink. Ears:  Normal auditory acuity. Nose:  No deformity, discharge, or lesions. Mouth:  No deformity or lesions,oropharynx pink & moist. Neck:  Supple; no masses or thyromegaly. Lungs:  Respirations even and unlabored.  Clear throughout to auscultation.   No wheezes, crackles, or rhonchi. No acute distress. Heart:  Regular rate and rhythm; no murmurs, clicks, rubs, or gallops. Abdomen:  Normal bowel sounds. Soft, non-tender and non-distended without masses, hepatosplenomegaly or hernias noted.  No guarding or rebound tenderness.   Rectal: Not performed Msk:  Symmetrical without gross deformities. Good, equal movement & strength bilaterally. Pulses:  Normal pulses noted. Extremities:  No clubbing or edema.  No cyanosis. Neurologic:  Alert and oriented x3;  grossly normal neurologically. Skin:  Intact without significant lesions or rashes. No jaundice. Psych:  Alert and cooperative. Normal mood and affect.  Imaging Studies: No abdominal imaging  Assessment and Plan:   Carrie Cross is a 46 y.o. female with history of chronic GERD on long-term low-dose omeprazole, unable to wean off PPI  Chronic GERD Discussed about upper endoscopy with esophageal biopsies, evaluate for Barrett's esophagus Continue  omeprazole 20 mg daily Discussed about antireflux lifestyle, information provided   Follow up based on the EGD findings   Carrie Darby, MD

## 2022-07-08 ENCOUNTER — Encounter: Payer: Self-pay | Admitting: Gastroenterology

## 2022-07-10 ENCOUNTER — Telehealth (INDEPENDENT_AMBULATORY_CARE_PROVIDER_SITE_OTHER): Payer: BC Managed Care – PPO | Admitting: Family Medicine

## 2022-07-10 DIAGNOSIS — M5020 Other cervical disc displacement, unspecified cervical region: Secondary | ICD-10-CM | POA: Diagnosis not present

## 2022-07-10 DIAGNOSIS — M542 Cervicalgia: Secondary | ICD-10-CM | POA: Diagnosis not present

## 2022-07-10 MED ORDER — BACLOFEN 5 MG PO TABS: 5.0000 mg | ORAL_TABLET | Freq: Three times a day (TID) | ORAL | 0 refills | Status: DC | PRN

## 2022-07-10 NOTE — Patient Instructions (Addendum)
Highly recommend doing heat therapy, gentle stretching and you can continue over the counter NSAIDs and topical meds (what you told me you were taking was fine - aleve twice a day, you can take tylenol too if needed)  You may want to consider reaching out to Dr. Annamaria Boots office if your symptoms don't resolve or if they get worse - with any stiffness, decreased range of motion, or neuropathy or numbness/tingling.  If you are stiff  or have muscle spasms you can try the muscle relaxer (baclofen)- it can be sedating so you cannot take and drive or work with it - may be able to use at night or weekends  Go get the xray done at [facility] the main medical mall when you can - check into registration and they will get you to radiology  Heat Therapy Heat therapy can help ease sore, stiff, injured, and tight muscles and joints. Heat relaxes your muscles. This may help ease your pain and muscle spasms. What are the risks? If you have any of the following conditions, do not use heat therapy unless your doctor says it is okay. These conditions include: New bruises. Open wounds. Any of these in the area being treated: Healing wounds. Infected skin. Scarred skin. Problems with how blood moves through your body (circulation). Loss of feeling (numbness) in the part of your body that is being treated. Unusual swelling of the part of the body that is being treated. Blood clots. Diabetes. Heart disease. Cancer. Not being able to communicate pain. This may include young children and people who have problems with their brain function (dementia). How to use heat therapy There are different kinds of heat therapy. These include: Moist heat pack. Hot water bottle. Electric heating pad. Heated gel pack. Heated wrap. Warm water bath. Your doctor will tell you how to use heat therapy. In general, you should: Place a towel between your skin and the heat source. Leave the heat on for 20-30 minutes. Your skin may turn  pink. Take off the heat if your skin turns bright red. This is very important. If you cannot feel pain, heat, or cold, you have a greater risk of getting burned. Your doctor may also tell you to take a warm water bath. To do this: Put a non-slip pad in the bathtub to prevent a fall. Fill the bathtub with warm water. Check the water temperature. Soak in the water for 15-20 minutes, or as told by your doctor. Be careful when you stand up after the bath. You may feel dizzy. Pat yourself dry after the bath. Do not rub your skin to dry it. General recommendations for heat therapy Be careful not to burn your skin when using heat therapy. High heat or using heat for a long time can cause burns. Do not sleep while using heat therapy. Only use heat therapy while you are awake. Check your skin during heat therapy. Do not use heat therapy if you have a new injury, especially if you have swelling on the injured area. Do not use heat therapy on areas of your skin that are already irritated, such as with a rash or sunburn. Do not use heat therapy if your skin turns bright red. Contact a doctor if: You have blisters, redness, swelling, or loss of feeling in the area where you use heat therapy. You have new pain. You have pain that gets worse. Summary Heat therapy is the use of heat to help ease sore, stiff, injured, and tight muscles and  joints. There are different types of heat therapy. Your doctor will tell you which one to use. Only use heat therapy while you are awake. Watch your skin to make sure you do not get burned while using heat therapy. This information is not intended to replace advice given to you by your health care provider. Make sure you discuss any questions you have with your health care provider. Document Revised: 06/20/2020 Document Reviewed: 06/20/2020 Elsevier Patient Education  [place].

## 2022-07-10 NOTE — Progress Notes (Signed)
Name: Carrie Cross   MRN: [id number]    DOB: 01-24-1976   Date:07/10/2022       Progress Note  Subjective:    Chief Complaint  Chief Complaint  Patient presents with   Neck Pain    Pt states onset for 2 weeks pain goes from neck to behind the ear on left side only. Pt states she was riding in the car when she noticed it.    I connected with  [name]  on 07/10/22 at 11:20 AM EST by a video enabled telemedicine application and verified that I am speaking with the correct person using two identifiers.  I discussed the limitations of evaluation and management by telemedicine and the availability of in person appointments. The patient expressed understanding and agreed to proceed. Staff also discussed with the patient that there may be a patient responsible charge related to this service. Patient Location: work - left to go outside in [facility] Location: [facility] clinic office Additional Individuals present: none  Neck Pain  Pertinent negatives include no numbness or weakness.   Pt had onset of sharp neck pain that began while seated at rest driving home Pain located posterior neck upper back left side shoots and radiates to ear, shooting/stabbing and sometimes throbbing  Some ttp to her ear and behind her ear Comes and goes At night sometimes when she feels like its pullings Tried to do massage herself with topical NSAID cream, aleve po - meds help ease sx a little bit  No radiation of pain to shoulder or arm Ha triggered in the back of her head Normal ROM   [place] [facility] - ?Dr. Saintclair Halsted - disc replacement to cervical spine a few years ago Feb 2020-2021    Patient Active Problem List   Diagnosis Date Noted   Colon cancer screening    Adenomatous polyp of sigmoid colon    Inflammatory arthritis 05/02/2021   Diabetes ([place]) 05/02/2021   Muscle cramps 02/18/2020   Tingling 02/18/2020   IUD (intrauterine device) in place 01/27/2020   Insomnia  11/18/2019   Prediabetes 04/25/2019   Herpes simplex 08/10/2015   Eczema 05/11/2015   Hypertension    HLD (hyperlipidemia)    GERD (gastroesophageal reflux disease)    Allergic rhinitis     Social History   Tobacco Use   Smoking status: Former    Packs/day: 0.50    Years: 7.00    Total pack years: 3.50    Types: Cigarettes    Quit date: 06/02/2011    Years since quitting: 11.1   Smokeless tobacco: Never  Substance Use Topics   Alcohol use: Yes    Alcohol/week: 3.0 standard drinks of alcohol    Types: 3 Glasses of wine per week     Current Outpatient Medications:    acyclovir cream (ZOVIRAX) 5 %, Apply 1 Application topically every 3 (three) hours., Disp: , Rfl:    ALLERGY RELIEF 180 MG tablet, TAKE 1 TABLET BY MOUTH EVERY DAY FOR ALLERGIES OR SINUS, Disp: 90 tablet, Rfl: 3   amLODipine (NORVASC) 10 MG tablet, TAKE 1 TABLET(10 MG) BY MOUTH DAILY, Disp: 90 tablet, Rfl: 1   atorvastatin (LIPITOR) 20 MG tablet, TAKE 1 TABLET AT BEDTIME, Disp: 90 tablet, Rfl: 1   clobetasol ointment (TEMOVATE) 7.82 %, Apply 1 application topically 2 (two) times daily. Apply to affected area, Disp: 30 g, Rfl: 2   fluticasone (FLONASE) 50 MCG/ACT nasal spray, USE 2 SPRAYS IN [facility]  NOSTRIL DAILY., Disp: 48 g, Rfl: 1   levonorgestrel (MIRENA) 20 MCG/24HR IUD, by Intrauterine route. Reported on 12/13/2015, Disp: , Rfl:    metoprolol succinate (TOPROL-XL) 100 MG 24 hr tablet, TAKE 1 TABLET DAILY WITH ORIMMEDIATELY FOLLOWING A    MEAL, Disp: 90 tablet, Rfl: 1   omeprazole (PRILOSEC) 20 MG capsule, TAKE 1 CAPSULE DAILY BEFOREBREAKFAST, Disp: 90 capsule, Rfl: 3  No Known Allergies  I personally reviewed active problem list, medication list, allergies, family history, social history, health maintenance, notes from last encounter, lab results, imaging with the patient/caregiver today.   Review of Systems  Constitutional: Negative.   HENT: Negative.    Eyes: Negative.  Negative for visual  disturbance.  Respiratory: Negative.    Cardiovascular: Negative.   Gastrointestinal: Negative.   Endocrine: Negative.   Genitourinary: Negative.   Musculoskeletal:  Positive for neck pain. Negative for arthralgias, back pain and neck stiffness.  Skin: Negative.   Allergic/Immunologic: Negative.   Neurological: Negative.  Negative for dizziness, weakness and numbness.  Hematological: Negative.   Psychiatric/Behavioral: Negative.    All other systems reviewed and are negative.     Objective:   Virtual encounter, vitals limited, only able to obtain the following There were no vitals filed for this visit. There is no height or weight on file to calculate BMI. Nursing Note and Vital Signs reviewed.  Physical Exam Vitals and nursing note reviewed.  Constitutional:      Appearance: Normal appearance.  Neck:     Trachea: Trachea and phonation normal.  Musculoskeletal:     Cervical back: Normal range of motion.  Neurological:     Mental Status: She is alert.  Psychiatric:        Mood and Affect: Mood normal.        Behavior: Behavior normal.     PE limited by virtual encounter  No results found for this or any previous visit (from the past 72 hour(s)).  Assessment and Plan:     ICD-10-CM   1. Neck pain on left side  M54.2 DG Cervical Spine Complete    Baclofen 5 MG TABS     Plan reviewed verbally with pt at time of exam and put in AVS for her She preferred to do imaging, try conservative management and if not improved/resolved then she would f/up with her spine surgeon Dr. Saintclair Halsted  For now continue OTC meds, heat tx, gentle stretching Xray to screen with past surgery Currently she reports normal/good ROM, some HA to back of head and sometimes pain referred to left ear, pain coming and going, but no radicular sx, no midline pain, no weakness, numbness, tingling to arm, may be a simple MSK strain.   AVS pt instructions/plan: Highly recommend doing heat therapy, gentle  stretching and you can continue over the counter NSAIDs and topical meds (what you told me you were taking was fine - aleve twice a day, you can take tylenol too if needed)  You may want to consider reaching out to Dr. Annamaria Boots office if your symptoms don't resolve or if they get worse - with any stiffness, decreased range of motion, or neuropathy or numbness/tingling.  If you are stiff  or have muscle spasms you can try the muscle relaxer (baclofen)- it can be sedating so you cannot take and drive or work with it - may be able to use at night or weekends  Go get the xray done at [facility] the main medical mall when you can - check  into registration and they will get you to radiology  Additional handout on heat therapy sent   We reviewed the chart thoroughly with pt and with CMA today to find recent surgical records - they are not in chart - we are requesting most recent info, dx, surgical hx and last imaging from  Dr.  Kary Kos at [place]  I did explain that best care may be folowing up with specialists who has all records of prior surgery/imaging and they may be able to do PT if needed.     Return for 2-4 week f/up if not improving .  - I discussed the assessment and treatment plan with the patient. The patient was provided an opportunity to ask questions and all were answered. The patient agreed with the plan and demonstrated an understanding of the instructions.  I provided 20 minutes of non-face-to-face time during this encounter.  Delsa Grana, PA-C 07/10/22 11:05 AM

## 2022-07-11 ENCOUNTER — Encounter: Payer: Self-pay | Admitting: Gastroenterology

## 2022-07-14 ENCOUNTER — Telehealth: Payer: BC Managed Care – PPO | Admitting: Family Medicine

## 2022-07-16 ENCOUNTER — Ambulatory Visit
Admission: RE | Admit: 2022-07-16 | Discharge: 2022-07-16 | Disposition: A | Discharge status: Home / Self Care | Payer: BC Managed Care – PPO | Source: Ambulatory Visit | Attending: Family Medicine | Admitting: Family Medicine

## 2022-07-16 DIAGNOSIS — Z1231 Encounter for screening mammogram for malignant neoplasm of breast: Secondary | ICD-10-CM | POA: Insufficient documentation

## 2022-07-16 NOTE — Anesthesia Preprocedure Evaluation (Signed)
Anesthesia Evaluation  Patient identified by MRN, date of birth, ID band Patient awake    Reviewed: Allergy & Precautions, NPO status , Patient's Chart, lab work & pertinent test results, reviewed documented beta blocker date and time   Airway Mallampati: III  TM Distance: >3 FB Neck ROM: full    Dental no notable dental hx.    Pulmonary former smoker   Pulmonary exam normal        Cardiovascular hypertension, Pt. on medications and Pt. on home beta blockers Normal cardiovascular exam     Neuro/Psych negative neurological ROS  negative psych ROS   GI/Hepatic Neg liver ROS,GERD  Medicated,,  Endo/Other  prediabetes  Renal/GU negative Renal ROS  negative genitourinary   Musculoskeletal   Abdominal Normal abdominal exam  (+)   Peds  Hematology negative hematology ROS (+)   Anesthesia Other Findings Past Medical History: No date: Allergic rhinitis No date: Carpal tunnel syndrome No date: Chronic migraine No date: GERD (gastroesophageal reflux disease) No date: Hx of pyelonephritis     Comment:  as a child No date: Hyperlipidemia No date: Hypertension     Comment:  Controlled per pt No date: IFG (impaired fasting glucose) No date: Irregular menstruation No date: Wears contact lenses  Past Surgical History: [id number]: BACK SURGERY     Comment:  disc shaved down [id number]: CERVICAL DISC ARTHROPLASTY 10/25/2021: COLONOSCOPY WITH PROPOFOL; N/A     Comment:  Procedure: COLONOSCOPY WITH PROPOFOL;  Surgeon: Lin Landsman, MD;  Location: [facility] ENDOSCOPY;  Service:               Gastroenterology;  Laterality: N/A;  BMI    Body Mass Index: 28.32 kg/m      Reproductive/Obstetrics negative OB ROS                             Anesthesia Physical Anesthesia Plan  ASA: 2  Anesthesia Plan: General   Post-op Pain Management: Minimal or no pain anticipated   Induction:  Intravenous  PONV Risk Score and Plan: Propofol infusion and TIVA  Airway Management Planned: Natural Airway  Additional Equipment:   Intra-op Plan:   Post-operative Plan:   Informed Consent: I have reviewed the patients History and Physical, chart, labs and discussed the procedure including the risks, benefits and alternatives for the proposed anesthesia with the patient or authorized representative who has indicated his/her understanding and acceptance.     Dental Advisory Given  Plan Discussed with: Anesthesiologist, CRNA and Surgeon  Anesthesia Plan Comments:        Anesthesia Quick Evaluation

## 2022-07-17 ENCOUNTER — Ambulatory Visit: Payer: BC Managed Care – PPO | Admitting: Anesthesiology

## 2022-07-17 ENCOUNTER — Ambulatory Visit
Admission: RE | Admit: 2022-07-17 | Discharge: 2022-07-17 | Disposition: A | Discharge status: Home / Self Care | Payer: BC Managed Care – PPO | Attending: Gastroenterology | Admitting: Gastroenterology

## 2022-07-17 ENCOUNTER — Encounter
Admission: RE | Disposition: A | Discharge status: Home / Self Care | Payer: Self-pay | Source: Home / Self Care | Attending: Gastroenterology

## 2022-07-17 ENCOUNTER — Other Ambulatory Visit: Payer: Self-pay

## 2022-07-17 ENCOUNTER — Encounter: Payer: Self-pay | Admitting: Gastroenterology

## 2022-07-17 DIAGNOSIS — K219 Gastro-esophageal reflux disease without esophagitis: Secondary | ICD-10-CM

## 2022-07-17 DIAGNOSIS — Z8249 Family history of ischemic heart disease and other diseases of the circulatory system: Secondary | ICD-10-CM | POA: Insufficient documentation

## 2022-07-17 DIAGNOSIS — Z833 Family history of diabetes mellitus: Secondary | ICD-10-CM | POA: Insufficient documentation

## 2022-07-17 DIAGNOSIS — Z79899 Other long term (current) drug therapy: Secondary | ICD-10-CM | POA: Insufficient documentation

## 2022-07-17 DIAGNOSIS — E119 Type 2 diabetes mellitus without complications: Secondary | ICD-10-CM

## 2022-07-17 DIAGNOSIS — K3189 Other diseases of stomach and duodenum: Secondary | ICD-10-CM | POA: Diagnosis not present

## 2022-07-17 DIAGNOSIS — K319 Disease of stomach and duodenum, unspecified: Secondary | ICD-10-CM | POA: Diagnosis not present

## 2022-07-17 DIAGNOSIS — R7303 Prediabetes: Secondary | ICD-10-CM | POA: Insufficient documentation

## 2022-07-17 DIAGNOSIS — Z87891 Personal history of nicotine dependence: Secondary | ICD-10-CM | POA: Diagnosis not present

## 2022-07-17 DIAGNOSIS — I10 Essential (primary) hypertension: Secondary | ICD-10-CM | POA: Insufficient documentation

## 2022-07-17 HISTORY — DX: Presence of spectacles and contact lenses: Z97.3

## 2022-07-17 HISTORY — PX: ESOPHAGOGASTRODUODENOSCOPY (EGD) WITH PROPOFOL: SHX5813

## 2022-07-17 LAB — POCT PREGNANCY, URINE: Preg Test, Ur: NEGATIVE

## 2022-07-17 SURGERY — ESOPHAGOGASTRODUODENOSCOPY (EGD) WITH PROPOFOL
Anesthesia: General

## 2022-07-17 MED ORDER — LIDOCAINE HCL (CARDIAC) PF 100 MG/5ML IV SOSY
PREFILLED_SYRINGE | INTRAVENOUS | Status: DC | PRN
  Administered 2022-07-17: 40 mg via INTRAVENOUS

## 2022-07-17 MED ORDER — LACTATED RINGERS IV SOLN: INTRAVENOUS | Status: DC

## 2022-07-17 MED ORDER — GLYCOPYRROLATE 0.2 MG/ML IJ SOLN
INTRAMUSCULAR | Status: DC | PRN
  Administered 2022-07-17: .2 mg via INTRAVENOUS

## 2022-07-17 MED ORDER — PROPOFOL 10 MG/ML IV BOLUS
INTRAVENOUS | Status: DC | PRN
  Administered 2022-07-17: 30 mg via INTRAVENOUS
  Administered 2022-07-17: 100 mg via INTRAVENOUS
  Administered 2022-07-17: 50 mg via INTRAVENOUS

## 2022-07-17 MED ORDER — SODIUM CHLORIDE 0.9 % IV SOLN: INTRAVENOUS | Status: DC

## 2022-07-17 MED ORDER — STERILE WATER FOR IRRIGATION IR SOLN
Status: DC | PRN
  Administered 2022-07-17: 50 mL

## 2022-07-17 SURGICAL SUPPLY — 8 items
BLOCK BITE 60FR ADLT L/F GRN (MISCELLANEOUS) ×1 IMPLANT
FORCEPS BIOP RAD 4 LRG CAP 4 (CUTTING FORCEPS) IMPLANT
GOWN CVR UNV OPN BCK APRN NK (MISCELLANEOUS) ×2 IMPLANT
GOWN ISOL THUMB LOOP REG UNIV (MISCELLANEOUS) ×2
KIT PRC NS LF DISP ENDO (KITS) ×1 IMPLANT
KIT PROCEDURE OLYMPUS (KITS) ×1
MANIFOLD NEPTUNE II (INSTRUMENTS) ×1 IMPLANT
WATER STERILE IRR 250ML POUR (IV SOLUTION) ×1 IMPLANT

## 2022-07-17 NOTE — Anesthesia Postprocedure Evaluation (Signed)
Anesthesia Post Note  Patient: Carrie Cross  Procedure(s) Performed: ESOPHAGOGASTRODUODENOSCOPY (EGD) WITH BIOPSY  Patient location during evaluation: PACU Anesthesia Type: General Level of consciousness: awake and alert Pain management: pain level controlled Vital Signs Assessment: post-procedure vital signs reviewed and stable Respiratory status: spontaneous breathing, nonlabored ventilation and respiratory function stable Cardiovascular status: blood pressure returned to baseline and stable Postop Assessment: no apparent nausea or vomiting Anesthetic complications: no   No notable events documented.   Last Vitals:  Vitals:   07/17/22 0936 07/17/22 0946  BP: (!) 146/90 (!) 166/95  Pulse: 94 84  Resp: 18 14  Temp: (!) 36.3 C   SpO2: 98% 98%    Last Pain:  Vitals:   07/17/22 0946  TempSrc:   PainSc: 0-No pain                 [place] Ouch

## 2022-07-17 NOTE — H&P (Signed)
Carrie Darby, MD [address]  [place]  [place], [place] [address]  Main: [phone]  Fax: [phone] Pager: [phone]  Primary Care Physician:  Delsa Grana, PA-C Primary Gastroenterologist:  Dr. Cephas Cross  Pre-Procedure History & Physical: HPI:  Carrie Cross is a 46 y.o. female is here for an endoscopy.   Past Medical History:  Diagnosis Date   Allergic rhinitis    Carpal tunnel syndrome    Chronic migraine    GERD (gastroesophageal reflux disease)    Hx of pyelonephritis    as a child   Hyperlipidemia    Hypertension    Controlled per pt   IFG (impaired fasting glucose)    Irregular menstruation    Wears contact lenses     Past Surgical History:  Procedure Laterality Date   BACK SURGERY  [id number]   disc shaved down   CERVICAL DISC ARTHROPLASTY  [id number]   COLONOSCOPY WITH PROPOFOL N/A 10/25/2021   Procedure: COLONOSCOPY WITH PROPOFOL;  Surgeon: Lin Landsman, MD;  Location: [facility] ENDOSCOPY;  Service: Gastroenterology;  Laterality: N/A;    Prior to Admission medications   Medication Sig Start Date End Date Taking? Authorizing Provider  amLODipine (NORVASC) 10 MG tablet TAKE 1 TABLET(10 MG) BY MOUTH DAILY 11/15/21  Yes Teodora Medici, DO  atorvastatin (LIPITOR) 20 MG tablet TAKE 1 TABLET AT BEDTIME 03/31/22  Yes Delsa Grana, PA-C  Baclofen 5 MG TABS Take 5 mg by mouth 3 (three) times daily as needed (muscle stiffness, pain or spasms). Sedating - do not take and drive or operate heavy machinery 07/10/22  Yes Tapia, Leisa, PA-C  fluticasone (FLONASE) 50 MCG/ACT nasal spray USE 2 SPRAYS IN EACH       NOSTRIL DAILY. 03/31/22  Yes Delsa Grana, PA-C  metoprolol succinate (TOPROL-XL) 100 MG 24 hr tablet TAKE 1 TABLET DAILY WITH ORIMMEDIATELY FOLLOWING A    MEAL 04/14/22  Yes Delsa Grana, PA-C  Multiple Vitamin (MULTIVITAMIN) capsule Take 1 capsule by mouth daily.   Yes [provider]  omeprazole (PRILOSEC) 20 MG capsule TAKE 1  CAPSULE DAILY BEFOREBREAKFAST 02/17/22  Yes Delsa Grana, PA-C  acyclovir cream (ZOVIRAX) 5 % Apply 1 Application topically every 3 (three) hours. Patient not taking: Reported on 07/11/2022    [provider]  ALLERGY RELIEF 180 MG tablet TAKE 1 TABLET BY MOUTH EVERY DAY FOR ALLERGIES OR SINUS Patient not taking: Reported on 07/11/2022 02/18/21   Delsa Grana, PA-C  clobetasol ointment (TEMOVATE) 4.68 % Apply 1 application topically 2 (two) times daily. Apply to affected area 08/16/19   Delsa Grana, PA-C  levonorgestrel (MIRENA) 20 MCG/24HR IUD by Intrauterine route. Reported on 12/13/2015    [provider]    Allergies as of 07/07/2022   (No Known Allergies)    Family History  Problem Relation Age of Onset   Breast cancer Maternal Grandmother 4   Heart disease Maternal Grandfather    Stroke Neg Hx    Diabetes Neg Hx    Ovarian cancer Neg Hx    Colon cancer Neg Hx     Social History   Socioeconomic History   Marital status: Married    Spouse name: Not on file   Number of children: Not on file   Years of education: Not on file   Highest education level: Not on file  Occupational History   Not on file  Tobacco Use   Smoking status: Former    Packs/day: 0.50    Years: 7.00  Total pack years: 3.50    Types: Cigarettes    Quit date: 06/02/2011    Years since quitting: 11.1   Smokeless tobacco: Never  Vaping Use   Vaping Use: Never used  Substance and Sexual Activity   Alcohol use: Yes    Alcohol/week: 3.0 standard drinks of alcohol    Types: 3 Glasses of wine per week   Drug use: No   Sexual activity: Yes    Birth control/protection: I.U.D.    Comment: mirena  Other Topics Concern   Not on file  Social History Narrative   Not on file   Social Determinants of Health   Financial Resource Strain: Medium Risk (08/13/2017)   Overall Financial Resource Strain (CARDIA)    Difficulty of Paying Living Expenses: Somewhat hard  Food Insecurity: No Food  Insecurity (08/13/2017)   Hunger Vital Sign    Worried About Running Out of Food in the Last Year: Never true    Ran Out of Food in the Last Year: Never true  Transportation Needs: No Transportation Needs (08/13/2017)   PRAPARE - Hydrologist (Medical): No    Lack of Transportation (Non-Medical): No  Physical Activity: Sufficiently Active (08/13/2017)   Exercise Vital Sign    Days of Exercise per Week: 3 days    Minutes of Exercise per Session: 90 min  Stress: No Stress Concern Present (08/13/2017)   [place]    Feeling of Stress : Only a little  Social Connections: Unknown (08/13/2017)   Social Connection and Isolation Panel [NHANES]    Frequency of Communication with Friends and Family: Not on file    Frequency of Social Gatherings with Friends and Family: Not on file    Attends Religious Services: Not on file    Active Member of Clubs or Organizations: Not on file    Attends Archivist Meetings: Not on file    Marital Status: Married  Intimate Partner Violence: Not At Risk (08/13/2017)   Humiliation, Afraid, Rape, and Kick questionnaire    Fear of Current or Ex-Partner: No    Emotionally Abused: No    Physically Abused: No    Sexually Abused: No    Review of Systems: See HPI, otherwise negative ROS  Physical Exam: BP (!) 163/93   Temp 97.7 F (36.5 C) (Tympanic)   Ht 5' 4.02" (1.626 m)   Wt 74.3 kg   SpO2 100%   BMI 28.10 kg/m  General:   Alert,  pleasant and cooperative in NAD Head:  Normocephalic and atraumatic. Neck:  Supple; no masses or thyromegaly. Lungs:  Clear throughout to auscultation.    Heart:  Regular rate and rhythm. Abdomen:  Soft, nontender and nondistended. Normal bowel sounds, without guarding, and without rebound.   Neurologic:  Alert and  oriented x4;  grossly normal neurologically.  Impression/Plan: Carrie Cross is here for an  endoscopy to be performed for chronic GERD  Risks, benefits, limitations, and alternatives regarding  endoscopy have been reviewed with the patient.  Questions have been answered.  All parties agreeable.   Sherri Sear, MD  07/17/2022, 8:28 AM

## 2022-07-17 NOTE — Op Note (Signed)
[facility] Gastroenterology Patient Name: Carrie Cross Procedure Date: 07/17/2022 9:12 AM MRN: [id number] Account #: [id number] Date of Birth: 03/10/1976 Admit Type: Outpatient Age: 46 Room: [facility] OR ROOM 01 Gender: Female Note Status: Finalized Instrument Name: [id number] Procedure:             Upper GI endoscopy Indications:           Follow-up of gastro-esophageal reflux disease Providers:             Lin Landsman MD, MD Referring MD:          Delsa Grana (Referring MD) Medicines:             General Anesthesia Complications:         No immediate complications. Estimated blood loss: None. Procedure:             Pre-Anesthesia Assessment:                        - Prior to the procedure, a History and Physical was                         performed, and patient medications and allergies were                         reviewed. The patient is competent. The risks and                         benefits of the procedure and the sedation options and                         risks were discussed with the patient. All questions                         were answered and informed consent was obtained.                         Patient identification and proposed procedure were                         verified by the physician, the nurse, the                         anesthesiologist, the anesthetist and the technician                         in the pre-procedure area in the procedure room in the                         endoscopy suite. Mental Status Examination: alert and                         oriented. Airway Examination: normal oropharyngeal                         airway and neck mobility. Respiratory Examination:                         clear to auscultation. CV Examination: normal.  Prophylactic Antibiotics: The patient does not require                         prophylactic antibiotics. Prior Anticoagulants: The                          patient has taken no anticoagulant or antiplatelet                         agents. ASA Grade Assessment: II - A patient with mild                         systemic disease. After reviewing the risks and                         benefits, the patient was deemed in satisfactory                         condition to undergo the procedure. The anesthesia                         plan was to use general anesthesia. Immediately prior                         to administration of medications, the patient was                         re-assessed for adequacy to receive sedatives. The                         heart rate, respiratory rate, oxygen saturations,                         blood pressure, adequacy of pulmonary ventilation, and                         response to care were monitored throughout the                         procedure. The physical status of the patient was                         re-assessed after the procedure.                        After obtaining informed consent, the endoscope was                         passed under direct vision. Throughout the procedure,                         the patient's blood pressure, pulse, and oxygen                         saturations were monitored continuously. The Endoscope                         was introduced through the mouth, and advanced to the  second part of duodenum. The upper GI endoscopy was                         accomplished without difficulty. The patient tolerated                         the procedure well. Findings:      The duodenal bulb and second portion of the duodenum were normal.      Striped moderately erythematous mucosa without bleeding was found in the       gastric antrum. Biopsies were taken with a cold forceps for Helicobacter       pylori testing.      The gastric body and incisura were normal. Biopsies were taken with a       cold forceps for Helicobacter pylori testing. Estimated blood loss:  none.      The cardia and gastric fundus were normal on retroflexion.      Esophagogastric landmarks were identified: the gastroesophageal junction       was found at 36 cm from the incisors.      The gastroesophageal junction and examined esophagus were normal.       Biopsies were taken with a cold forceps for histology. Impression:            - Normal duodenal bulb and second portion of the                         duodenum.                        - Erythematous mucosa in the antrum. Biopsied.                        - Normal gastric body and incisura. Biopsied.                        - Esophagogastric landmarks identified.                        - Normal gastroesophageal junction and esophagus. Recommendation:        - Discharge patient to home (with escort).                        - Resume previous diet today.                        - Continue present medications.                        - Await pathology results. Procedure Code(s):     --- Professional ---                        [phone], Esophagogastroduodenoscopy, flexible,                         transoral; with biopsy, single or multiple Diagnosis Code(s):     --- Professional ---                        K31.89, Other diseases of stomach and duodenum  K21.9, Gastro-esophageal reflux disease without                         esophagitis CPT copyright 2022 American Medical Association. All rights reserved. The codes documented in this report are preliminary and upon coder review may  be revised to meet current compliance requirements. Dr. Ulyess Mort Lin Landsman MD, MD 07/17/2022 9:34:49 AM This report has been signed electronically. Number of Addenda: 0 Note Initiated On: 07/17/2022 9:12 AM Total Procedure Duration: 0 hours 6 minutes 27 seconds  Estimated Blood Loss:  Estimated blood loss: none. Estimated blood loss: none.      [facility]

## 2022-07-17 NOTE — Transfer of Care (Signed)
Immediate Anesthesia Transfer of Care Note  Patient: Carrie Cross  Procedure(s) Performed: ESOPHAGOGASTRODUODENOSCOPY (EGD) WITH BIOPSY  Patient Location: PACU  Anesthesia Type: General  Level of Consciousness: awake, alert  and patient cooperative  Airway and Oxygen Therapy: Patient Spontanous Breathing and Patient connected to supplemental oxygen  Post-op Assessment: Post-op Vital signs reviewed, Patient's Cardiovascular Status Stable, Respiratory Function Stable, Patent Airway and No signs of Nausea or vomiting  Post-op Vital Signs: Reviewed and stable  Complications: No notable events documented.

## 2022-07-18 ENCOUNTER — Encounter: Payer: Self-pay | Admitting: Gastroenterology

## 2022-07-21 LAB — SURGICAL PATHOLOGY

## 2022-07-22 ENCOUNTER — Encounter: Payer: Self-pay | Admitting: Gastroenterology

## 2022-08-11 ENCOUNTER — Encounter: Payer: Self-pay | Admitting: Family Medicine

## 2022-08-15 ENCOUNTER — Telehealth (INDEPENDENT_AMBULATORY_CARE_PROVIDER_SITE_OTHER): Payer: BC Managed Care – PPO | Admitting: Family Medicine

## 2022-08-15 DIAGNOSIS — F40243 Fear of flying: Secondary | ICD-10-CM | POA: Diagnosis not present

## 2022-08-15 MED ORDER — HYDROXYZINE PAMOATE 25 MG PO CAPS: 25.0000 mg | ORAL_CAPSULE | Freq: Every day | ORAL | 0 refills | Status: AC | PRN

## 2022-08-15 MED ORDER — BUSPIRONE HCL 5 MG PO TABS: 5.0000 mg | ORAL_TABLET | Freq: Two times a day (BID) | ORAL | 0 refills | Status: AC | PRN
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
## 2022-08-19 ENCOUNTER — Encounter: Payer: BC Managed Care – PPO | Admitting: Family Medicine

## 2022-08-26 ENCOUNTER — Encounter: Payer: BC Managed Care – PPO | Admitting: Family Medicine

## 2022-08-27 NOTE — Patient Instructions (Signed)
Health Maintenance  Topic Date Due   Eye exam for diabetics  Never done   Yearly kidney health urinalysis for diabetes  Never done   Complete foot exam   08/13/2022   Hemoglobin A1C  08/15/2022   COVID-19 Vaccine (6 - 2023-24 season) 08/30/2022   Yearly kidney function blood test for diabetes  02/14/2023   Mammogram  07/17/2023   Colon Cancer Screening  10/25/2024   Pap Smear  01/31/2026   DTaP/Tdap/Td vaccine (3 - Td or Tdap) 08/07/2030   Flu Shot  Completed   Hepatitis C Screening: USPSTF Recommendation to screen - Ages 18-79 yo.  Completed   HIV Screening  Addressed   HPV Vaccine  Aged Out   You are eligible to get pneumococcal vaccine if you would like Shingrix shot at age 82 +  Please call your eye doctor and ask for a diabetic eye exam     Preventive Care 35-45 Years Old, Female Preventive care refers to lifestyle choices and visits with your health care provider that can promote health and wellness. Preventive care visits are also called wellness exams. What can I expect for my preventive care visit? Counseling Your health care provider may ask you questions about your: Medical history, including: Past medical problems. Family medical history. Pregnancy history. Current health, including: Menstrual cycle. Method of birth control. Emotional well-being. Home life and relationship well-being. Sexual activity and sexual health. Lifestyle, including: Alcohol, nicotine or tobacco, and drug use. Access to firearms. Diet, exercise, and sleep habits. Work and work Statistician. Sunscreen use. Safety issues such as seatbelt and bike helmet use. Physical exam Your health care provider will check your: Height and weight. These may be used to calculate your BMI (body mass index). BMI is a measurement that tells if you are at a healthy weight. Waist circumference. This measures the distance around your waistline. This measurement also tells if you are at a healthy weight and  may help predict your risk of certain diseases, such as type 2 diabetes and high blood pressure. Heart rate and blood pressure. Body temperature. Skin for abnormal spots. What immunizations do I need?  Vaccines are usually given at various ages, according to a schedule. Your health care provider will recommend vaccines for you based on your age, medical history, and lifestyle or other factors, such as travel or where you work. What tests do I need? Screening Your health care provider may recommend screening tests for certain conditions. This may include: Lipid and cholesterol levels. Diabetes screening. This is done by checking your blood sugar (glucose) after you have not eaten for a while (fasting). Pelvic exam and Pap test. Hepatitis B test. Hepatitis C test. HIV (human immunodeficiency virus) test. STI (sexually transmitted infection) testing, if you are at risk. Lung cancer screening. Colorectal cancer screening. Mammogram. Talk with your health care provider about when you should start having regular mammograms. This may depend on whether you have a family history of breast cancer. BRCA-related cancer screening. This may be done if you have a family history of breast, ovarian, tubal, or peritoneal cancers. Bone density scan. This is done to screen for osteoporosis. Talk with your health care provider about your test results, treatment options, and if necessary, the need for more tests. Follow these instructions at home: Eating and drinking  Eat a diet that includes fresh fruits and vegetables, whole grains, lean protein, and low-fat dairy products. Take vitamin and mineral supplements as recommended by your health care provider. Do not drink  alcohol if: Your health care provider tells you not to drink. You are pregnant, may be pregnant, or are planning to become pregnant. If you drink alcohol: Limit how much you have to 0-1 drink a day. Know how much alcohol is in your drink.  In the [place] one drink equals one 12 oz bottle of beer (355 mL), one 5 oz glass of wine (148 mL), or one 1 oz glass of hard liquor (44 mL). Lifestyle Brush your teeth every morning and night with fluoride toothpaste. Floss one time each day. Exercise for at least 30 minutes 5 or more days each week. Do not use any products that contain nicotine or tobacco. These products include cigarettes, chewing tobacco, and vaping devices, such as e-cigarettes. If you need help quitting, ask your health care provider. Do not use drugs. If you are sexually active, practice safe sex. Use a condom or other form of protection to prevent STIs. If you do not wish to become pregnant, use a form of birth control. If you plan to become pregnant, see your health care provider for a prepregnancy visit. Take aspirin only as told by your health care provider. Make sure that you understand how much to take and what form to take. Work with your health care provider to find out whether it is safe and beneficial for you to take aspirin daily. Find healthy ways to manage stress, such as: Meditation, yoga, or listening to music. Journaling. Talking to a trusted person. Spending time with friends and family. Minimize exposure to UV radiation to reduce your risk of skin cancer. Safety Always wear your seat belt while driving or riding in a vehicle. Do not drive: If you have been drinking alcohol. Do not ride with someone who has been drinking. When you are tired or distracted. While texting. If you have been using any mind-altering substances or drugs. Wear a helmet and other protective equipment during sports activities. If you have firearms in your house, make sure you follow all gun safety procedures. Seek help if you have been physically or sexually abused. What's next? Visit your health care provider once a year for an annual wellness visit. Ask your health care provider how often you should have your eyes and teeth  checked. Stay up to date on all vaccines. This information is not intended to replace advice given to you by your health care provider. Make sure you discuss any questions you have with your health care provider. Document Revised: 02/13/2021 Document Reviewed: 02/13/2021 [employer] Patient Education  [employer].

## 2022-08-27 NOTE — Progress Notes (Signed)
Patient: Carrie Cross, Female    DOB: Nov 04, 1975, 46 y.o.   MRN: [id number] Delsa Grana, PA-C Visit Date: 08/28/2022  Today's Provider: Delsa Grana, PA-C   Chief Complaint  Patient presents with   Annual Exam   Subjective:   Annual physical exam:  Carrie Cross is a 46 y.o. female who presents today for complete physical exam:  Exercise/Activity:  not exercising regularly Diet/nutrition: healthy Sleep: no concerns  [place]: No Food Insecurity (08/28/2022)  Housing: Low Risk  (08/28/2022)  Transportation Needs: No Transportation Needs (08/28/2022)  Utilities: Not At Risk (08/28/2022)  Alcohol Screen: Low Risk  (09/26/2020)  Depression (PHQ2-9): Low Risk  (08/28/2022)  Financial Resource Strain: Low Risk  (08/28/2022)  Physical Activity: Inactive (08/28/2022)  Social Connections: Socially Integrated (08/28/2022)  Stress: No Stress Concern Present (08/28/2022)  Tobacco Use: Medium Risk (08/28/2022)    DM/prediabetes f/up labs  USPSTF grade A and B recommendations - reviewed and addressed today  Depression:  Phq 9 completed today by patient, was reviewed by me with patient in the room PHQ score is neg, pt feels good    08/28/2022    9:19 AM 08/15/2022    8:30 AM 07/10/2022    9:07 AM 02/13/2022   10:33 AM  PHQ 2/9 Scores  PHQ - 2 Score 0 0 0 0  PHQ- 9 Score 0 0 0 0      08/28/2022    9:19 AM 08/15/2022    8:30 AM 07/10/2022    9:07 AM 02/13/2022   10:33 AM 12/31/2021   10:43 AM  Depression screen PHQ 2/9  Decreased Interest 0 0 0 0 0  Down, Depressed, Hopeless 0 0 0 0 0  PHQ - 2 Score 0 0 0 0 0  Altered sleeping 0 0 0 0 0  Tired, decreased energy 0 0 0 0 0  Change in appetite 0 0 0 0 0  Feeling bad or failure about yourself  0 0 0 0 0  Trouble concentrating 0 0 0 0 0  Moving slowly or fidgety/restless 0 0 0 0 0  Suicidal thoughts 0 0 0 0 0  PHQ-9 Score 0 0 0 0 0  Difficult doing work/chores  Not difficult at all  Not difficult at all Not difficult at all     Alcohol screening: Flowsheet Row Video [facility]26/[facility]  AUDIT-C Score 0       Immunizations and Health Maintenance: Health Maintenance  Topic Date Due   OPHTHALMOLOGY EXAM  Never done   Diabetic kidney evaluation - Urine ACR  Never done   FOOT EXAM  08/13/2022   HEMOGLOBIN A1C  08/15/2022   COVID-19 Vaccine (6 - 2023-24 season) 08/30/2022   Diabetic kidney evaluation - eGFR measurement  02/14/2023   MAMMOGRAM  07/17/2023   COLONOSCOPY (Pts 45-25yr Insurance coverage will need to be confirmed)  10/25/2024   PAP SMEAR-Modifier  01/31/2026   DTaP/Tdap/Td (3 - Td or Tdap) 08/07/2030   INFLUENZA VACCINE  Completed   Hepatitis C Screening  Completed   HIV Screening  Addressed   HPV VACCINES  Aged Out     Hep C Screening: done  STD testing and prevention (HIV/chl/gon/syphilis):  see above, no additional testing desired by pt today - monogomous, declines testing  Intimate partner violence:  denies abuse, feels safe  Sexual History/Pain during Intercourse: Married  Menstrual History/LMP/Abnormal Bleeding:  IUD replaced in 2020  No LMP  recorded. (Menstrual status: IUD).  Incontinence Symptoms: none  Breast cancer:  done 07/2022 reviewed  Last Mammogram: *see HM list above BRCA gene screening: no known  Cervical cancer screening: UTD, done 2022 neg pap and HPV Pt denies family hx of cancers - breast, ovarian, uterine, colon:     Osteoporosis:   Discussion on osteoporosis per age, including high calcium and vitamin D supplementation, weight bearing exercises  Skin cancer:  Hx of skin CA -  NO Discussed atypical lesions   Colorectal cancer:   Colonoscopy is due q 3 years  Discussed concerning signs and sx of CRC, pt denies change in bowels   Lung cancer:   Low Dose CT Chest recommended if Age 63-80 years, 20 pack-year currently smoking OR have quit w/in 15years. Patient does not  qualify.    Social History   Tobacco Use   Smoking status: Former    Packs/day: 0.50    Years: 7.00    Total pack years: 3.50    Types: Cigarettes    Quit date: 06/02/2011    Years since quitting: 11.2   Smokeless tobacco: Never  Vaping Use   Vaping Use: Never used  Substance Use Topics   Alcohol use: Yes    Alcohol/week: 3.0 standard drinks of alcohol    Types: 3 Glasses of wine per week   Drug use: No     Flowsheet Row Video [facility] 09/26/[facility]  AUDIT-C Score 0       Family History  Problem Relation Age of Onset   Breast cancer Maternal Grandmother 80   Heart disease Maternal Grandfather    Stroke Neg Hx    Diabetes Neg Hx    Ovarian cancer Neg Hx    Colon cancer Neg Hx      Blood pressure/Hypertension: BP Readings from Last 3 Encounters:  08/28/22 132/74  07/17/22 (!) 166/95  07/07/22 (!) 165/93    Weight/Obesity: Wt Readings from Last 3 Encounters:  08/28/22 160 lb (72.6 kg)  07/17/22 163 lb 12.8 oz (74.3 kg)  07/07/22 164 lb 4 oz (74.5 kg)   BMI Readings from Last 3 Encounters:  08/28/22 27.46 kg/m  07/17/22 28.10 kg/m  07/07/22 28.19 kg/m     Lipids:  Lab Results  Component Value Date   CHOL 150 02/13/2022   CHOL 125 05/02/2021   CHOL 130 05/21/2020   Lab Results  Component Value Date   HDL 72 02/13/2022   HDL 57 05/02/2021   HDL 59 05/21/2020   Lab Results  Component Value Date   LDLCALC 65 02/13/2022   LDLCALC 56 05/02/2021   LDLCALC 56 05/21/2020   Lab Results  Component Value Date   TRIG 53 02/13/2022   TRIG 41 05/02/2021   TRIG 74 05/21/2020   Lab Results  Component Value Date   CHOLHDL 2.1 02/13/2022   CHOLHDL 2.2 05/02/2021   CHOLHDL 2.2 05/21/2020   No results found for: "LDLDIRECT" Based on the results of lipid panel his/her cardiovascular risk factor ( using [place] )  in the next 10 years is: The 10-year ASCVD risk score (Arnett DK, et al., 2019) is: 3.3%   Values  used to calculate the score:     Age: 29 years     Sex: Female     Is Non-Hispanic African American: Yes     Diabetic: Yes     Tobacco smoker: No     Systolic Blood Pressure: 562 mmHg  Is BP treated: Yes     HDL Cholesterol: 72 mg/dL     Total Cholesterol: 150 mg/dL  Glucose:  Glucose  Date Value Ref Range Status  01/31/2021 122 (H) 65 - 99 mg/dL Final   Glucose, Bld  Date Value Ref Range Status  02/13/2022 85 65 - 99 mg/dL Final    Comment:    .            Fasting reference interval .   05/02/2021 98 65 - 99 mg/dL Final    Comment:    .            Fasting reference interval .   05/21/2020 117 (H) 65 - 99 mg/dL Final    Comment:    .            Fasting reference interval . For someone without known diabetes, a glucose value between 100 and 125 mg/dL is consistent with prediabetes and should be confirmed with a follow-up test. .     Advanced Care Planning:  A voluntary discussion about advance care planning including the explanation and discussion of advance directives.   Discussed health care proxy and Living will, and the patient was able to identify a health care proxy as spouse emmett.      Social History       Social History   Socioeconomic History   Marital status: Married    Spouse name: Not on file   Number of children: Not on file   Years of education: Not on file   Highest education level: Not on file  Occupational History   Not on file  Tobacco Use   Smoking status: Former    Packs/day: 0.50    Years: 7.00    Total pack years: 3.50    Types: Cigarettes    Quit date: 06/02/2011    Years since quitting: 11.2   Smokeless tobacco: Never  Vaping Use   Vaping Use: Never used  Substance and Sexual Activity   Alcohol use: Yes    Alcohol/week: 3.0 standard drinks of alcohol    Types: 3 Glasses of wine per week   Drug use: No   Sexual activity: Yes    Birth control/protection: I.U.D.    Comment: mirena  Other Topics Concern   Not on  file  Social History Narrative   Not on file   Social Determinants of Health   Financial Resource Strain: Low Risk  (08/28/2022)   Overall Financial Resource Strain (CARDIA)    Difficulty of Paying Living Expenses: Not hard at all  Food Insecurity: No Food Insecurity (08/28/2022)   Hunger Vital Sign    Worried About Running Out of Food in the Last Year: Never true    Ran Out of Food in the Last Year: Never true  Transportation Needs: No Transportation Needs (08/28/2022)   PRAPARE - Hydrologist (Medical): No    Lack of Transportation (Non-Medical): No  Physical Activity: Inactive (08/28/2022)   Exercise Vital Sign    Days of Exercise per Week: 0 days    Minutes of Exercise per Session: 0 min  Stress: No Stress Concern Present (08/28/2022)   [place]    Feeling of Stress : Only a little  Social Connections: Socially Integrated (08/28/2022)   Social Connection and Isolation Panel [NHANES]    Frequency of Communication with Friends and Family: More than three times a week  Frequency of Social Gatherings with Friends and Family: More than three times a week    Attends Religious Services: More than 4 times per year    Active Member of Clubs or Organizations: Yes    Attends Archivist Meetings: 1 to 4 times per year    Marital Status: Married    Family History        Family History  Problem Relation Age of Onset   Breast cancer Maternal Grandmother 12   Heart disease Maternal Grandfather    Stroke Neg Hx    Diabetes Neg Hx    Ovarian cancer Neg Hx    Colon cancer Neg Hx     Patient Active Problem List   Diagnosis Date Noted   Gastric erythema 07/17/2022   Chronic GERD 07/17/2022   Other cervical disc displacement, unspecified cervical region 07/10/2022   Colon cancer screening    Adenomatous polyp of sigmoid colon    Inflammatory arthritis 05/02/2021   Diabetes  ([place]) 05/02/2021   Muscle cramps 02/18/2020   Tingling 02/18/2020   IUD (intrauterine device) in place 01/27/2020   Insomnia 11/18/2019   Prediabetes 04/25/2019   Herpes simplex 08/10/2015   Eczema 05/11/2015   Hypertension    HLD (hyperlipidemia)    GERD (gastroesophageal reflux disease)    Allergic rhinitis     Past Surgical History:  Procedure Laterality Date   BACK SURGERY  [id number]   disc shaved down   CERVICAL DISC ARTHROPLASTY  [id number]   COLONOSCOPY WITH PROPOFOL N/A 10/25/2021   Procedure: COLONOSCOPY WITH PROPOFOL;  Surgeon: Lin Landsman, MD;  Location[facility] ENDOSCOPY;  Service: Gastroenterology;  Laterality: N/A;   ESOPHAGOGASTRODUODENOSCOPY (EGD) WITH PROPOFOL N/A 07/17/2022   Procedure: ESOPHAGOGASTRODUODENOSCOPY (EGD) WITH BIOPSY;  Surgeon: Lin Landsman, MD;  Location: [place];  Service: Endoscopy;  Laterality: N/A;  leave patient second     Current Outpatient Medications:    amLODipine (NORVASC) 10 MG tablet, TAKE 1 TABLET(10 MG) BY MOUTH DAILY, Disp: 90 tablet, Rfl: 1   atorvastatin (LIPITOR) 20 MG tablet, TAKE 1 TABLET AT BEDTIME, Disp: 90 tablet, Rfl: 1   Baclofen 5 MG TABS, Take 5 mg by mouth 3 (three) times daily as needed (muscle stiffness, pain or spasms). Sedating - do not take and drive or operate heavy machinery, Disp: 30 tablet, Rfl: 0   busPIRone (BUSPAR) 5 MG tablet, Take 1-3 tablets (5-15 mg total) by mouth 2 (two) times daily as needed., Disp: 30 tablet, Rfl: 0   clobetasol ointment (TEMOVATE) 6.81 %, Apply 1 application topically 2 (two) times daily. Apply to affected area, Disp: 30 g, Rfl: 2   fluticasone (FLONASE) 50 MCG/ACT nasal spray, USE 2 SPRAYS IN EACH       NOSTRIL DAILY., Disp: 48 g, Rfl: 1   hydrOXYzine (VISTARIL) 25 MG capsule, Take 1 capsule (25 mg total) by mouth daily as needed for anxiety., Disp: 15 capsule, Rfl: 0   levonorgestrel (MIRENA) 20 MCG/24HR IUD, by Intrauterine route. Reported on 12/13/2015, Disp: ,  Rfl:    metoprolol succinate (TOPROL-XL) 100 MG 24 hr tablet, TAKE 1 TABLET DAILY WITH ORIMMEDIATELY FOLLOWING A    MEAL, Disp: 90 tablet, Rfl: 1   Multiple Vitamin (MULTIVITAMIN) capsule, Take 1 capsule by mouth daily., Disp: , Rfl:    omeprazole (PRILOSEC) 20 MG capsule, TAKE 1 CAPSULE DAILY BEFOREBREAKFAST, Disp: 90 capsule, Rfl: 3   valACYclovir (VALTREX) 500 MG tablet, Take 500 mg by mouth daily. PRN, Disp: , Rfl:  No Known Allergies  Patient Care Team: Delsa Grana, PA-C as PCP - General (Family Medicine)   Chart Review: I personally reviewed active problem list, medication list, allergies, family history, social history, health maintenance, notes from last encounter, lab results, imaging with the patient/caregiver today.   Review of Systems  Constitutional: Negative.   HENT: Negative.    Eyes: Negative.   Respiratory: Negative.    Cardiovascular: Negative.   Gastrointestinal: Negative.   Endocrine: Negative.   Genitourinary: Negative.   Musculoskeletal: Negative.   Skin: Negative.   Allergic/Immunologic: Negative.   Neurological: Negative.   Hematological: Negative.   Psychiatric/Behavioral: Negative.    All other systems reviewed and are negative.         Objective:   Vitals:  Vitals:   08/28/22 0919  BP: 132/74  Pulse: 93  Resp: 16  SpO2: 99%  Weight: 160 lb (72.6 kg)  Height: _0  (1.626 m)    Body mass index is 27.46 kg/m.  Physical Exam Vitals and nursing note reviewed.  Constitutional:      General: She is not in acute distress.    Appearance: Normal appearance. She is well-developed. She is obese. She is not ill-appearing, toxic-appearing or diaphoretic.  HENT:     Head: Normocephalic and atraumatic.     Right Ear: Tympanic membrane, ear canal and external ear normal. There is no impacted cerumen.     Left Ear: Tympanic membrane, ear canal and external ear normal. There is no impacted cerumen.     Nose: Nose normal. No congestion or  rhinorrhea.     Mouth/Throat:     Mouth: Mucous membranes are moist.     Pharynx: Oropharynx is clear. Uvula midline. No oropharyngeal exudate or posterior oropharyngeal erythema.  Eyes:     General: Lids are normal. No scleral icterus.       Right eye: No discharge.        Left eye: No discharge.     Conjunctiva/sclera: Conjunctivae normal.  Neck:     Trachea: Phonation normal. No tracheal deviation.  Cardiovascular:     Rate and Rhythm: Normal rate and regular rhythm.     Pulses: Normal pulses.          Radial pulses are 2+ on the right side and 2+ on the left side.       Posterior tibial pulses are 2+ on the right side and 2+ on the left side.     Heart sounds: Normal heart sounds. No murmur heard.    No friction rub. No gallop.  Pulmonary:     Effort: Pulmonary effort is normal. No respiratory distress.     Breath sounds: Normal breath sounds. No stridor. No wheezing, rhonchi or rales.  Chest:     Chest wall: No tenderness.  Abdominal:     General: Bowel sounds are normal. There is no distension.     Palpations: Abdomen is soft.     Tenderness: There is no abdominal tenderness. There is no guarding or rebound.  Musculoskeletal:        General: No deformity.     Cervical back: Normal range of motion and neck supple.     Right lower leg: No edema.     Left lower leg: No edema.  Lymphadenopathy:     Cervical: No cervical adenopathy.  Skin:    General: Skin is warm and dry.     Capillary Refill: Capillary refill takes less than 2 seconds.     Coloration:  Skin is not pale.     Findings: No rash.  Neurological:     Mental Status: She is alert and oriented to person, place, and time.     Motor: No abnormal muscle tone.     Gait: Gait normal.  Psychiatric:        Speech: Speech normal.        Behavior: Behavior normal.       Fall Risk:    08/28/2022    9:19 AM 08/15/2022    8:30 AM 07/10/2022    9:06 AM 02/13/2022   10:32 AM 12/31/2021   10:43 AM  Fall Risk   Falls  in the past year? 0 0 0 0 0  Number falls in past yr: 0 0 0 0 0  Injury with Fall? 0 0 0 0 0  Risk for fall due to : _0   Follow up Falls prevention discussed Falls prevention discussed;Education provided;Falls evaluation completed Falls prevention discussed;Education provided;Falls evaluation completed Falls prevention discussed;Education provided Falls prevention discussed    Functional Status Survey: Is the patient deaf or have difficulty hearing?: No Does the patient have difficulty seeing, even when wearing glasses/contacts?: No Does the patient have difficulty concentrating, remembering, or making decisions?: No Does the patient have difficulty walking or climbing stairs?: No Does the patient have difficulty dressing or bathing?: No Does the patient have difficulty doing errands alone such as visiting a doctor's office or shopping?: No   Assessment & Plan:    CPE completed today  USPSTF grade A and B recommendations reviewed with patient; age-appropriate recommendations, preventive care, screening tests, etc discussed and encouraged; healthy living encouraged; see AVS for patient education given to patient  Discussed importance of 150 minutes of physical activity weekly, AHA exercise recommendations given to pt in AVS/handout  Discussed importance of healthy diet:  eating lean meats and proteins, avoiding trans fats and saturated fats, avoid simple sugars and excessive carbs in diet, eat 6 servings of fruit/vegetables daily and drink plenty of water and avoid sweet beverages.    Recommended pt to do annual eye exam and routine dental exams/cleanings  Depression, alcohol, fall screening completed as documented above and per flowsheets  Advance Care planning information and packet discussed and offered today, encouraged pt to discuss with family members/spouse/partner/friends and complete Advanced directive packet and  bring copy to office   Reviewed Health Maintenance: Health Maintenance  Topic Date Due   OPHTHALMOLOGY EXAM  Never done   COVID-19 Vaccine (6 - 2023-24 season) 08/30/2022   HEMOGLOBIN A1C  02/27/2023   MAMMOGRAM  07/17/2023   Diabetic kidney evaluation - eGFR measurement  08/29/2023   Diabetic kidney evaluation - Urine ACR  08/29/2023   FOOT EXAM  08/29/2023   COLONOSCOPY (Pts 45-51yr Insurance coverage will need to be confirmed)  10/25/2024   PAP SMEAR-Modifier  01/31/2026   DTaP/Tdap/Td (3 - Td or Tdap) 08/07/2030   INFLUENZA VACCINE  Completed   Hepatitis C Screening  Completed   HIV Screening  Addressed   HPV VACCINES  Aged Out    Immunizations: Immunization History  Administered Date(s) Administered   Influenza,inj,Quad PF,6+ Mos 08/01/2015, 06/05/2017, 05/22/2019, 04/29/2020, 05/02/2021   Influenza-Unspecified 06/05/2017, 06/18/2018, 05/22/2019, 07/05/2022   Moderna Sars-Covid-2 Vaccination 10/27/2019, 11/24/2019, 06/22/2020, 05/31/2021, 07/05/2022   Tdap 07/01/2010, 08/07/2020   Vaccines:  HPV: up to at age 262, ask insurance if age between 232-45 Shingrix: 532-64yo  and ask insurance if covered when patient above 58 yo Pneumonia: due - with T2DM that improved to prediabetes - she can get if wanted -  educated and discussed with patient. Flu:  educated and discussed with patient. :     ICD-10-CM   1. Adult general medical exam  Z00.00 HgB A1c    Urine Microalbumin w/creat. ratio    CBC with Differential/Platelet    COMPLETE METABOLIC PANEL WITH GFR    2. Type 2 diabetes mellitus without complication, without long-term current use of insulin (HCC)  E11.9 HgB A1c    Urine Microalbumin w/creat. ratio    COMPLETE METABOLIC PANEL WITH GFR   dm foot exam done today, pt due for DM eye exam - reviewed this with pt - she will f/up with eye doc          Delsa Grana, PA-C 08/28/22 9:49 AM  [facility]

## 2022-08-28 ENCOUNTER — Ambulatory Visit (INDEPENDENT_AMBULATORY_CARE_PROVIDER_SITE_OTHER): Payer: BC Managed Care – PPO | Admitting: Family Medicine

## 2022-08-28 ENCOUNTER — Encounter: Payer: Self-pay | Admitting: Family Medicine

## 2022-08-28 VITALS — BP 132/74 | HR 93 | Resp 16 | Ht 64.0 in | Wt 160.0 lb

## 2022-08-28 DIAGNOSIS — E119 Type 2 diabetes mellitus without complications: Secondary | ICD-10-CM

## 2022-08-28 DIAGNOSIS — Z0000 Encounter for general adult medical examination without abnormal findings: Secondary | ICD-10-CM | POA: Diagnosis not present

## 2022-08-29 LAB — COMPLETE METABOLIC PANEL WITH GFR
AG Ratio: 1.7 (calc) (ref 1.0–2.5)
ALT: 31 U/L — ABNORMAL HIGH (ref 6–29)
AST: 22 U/L (ref 10–35)
Albumin: 4.3 g/dL (ref 3.6–5.1)
Alkaline phosphatase (APISO): 81 U/L (ref 31–125)
BUN: 10 mg/dL (ref 7–25)
CO2: 27 mmol/L (ref 20–32)
Calcium: 9 mg/dL (ref 8.6–10.2)
Chloride: 103 mmol/L (ref 98–110)
Creat: 0.63 mg/dL (ref 0.50–0.99)
Globulin: 2.5 g/dL (calc) (ref 1.9–3.7)
Glucose, Bld: 147 mg/dL — ABNORMAL HIGH (ref 65–99)
Potassium: 3.2 mmol/L — ABNORMAL LOW (ref 3.5–5.3)
Sodium: 140 mmol/L (ref 135–146)
Total Bilirubin: 0.3 mg/dL (ref 0.2–1.2)
Total Protein: 6.8 g/dL (ref 6.1–8.1)
eGFR: 111 mL/min/{1.73_m2} (ref >=60)

## 2022-08-29 LAB — CBC WITH DIFFERENTIAL/PLATELET
Absolute Monocytes: 546 cells/uL (ref 200–950)
Basophils Absolute: 31 cells/uL (ref 0–200)
Basophils Relative: 0.5 %
Eosinophils Absolute: 87 cells/uL (ref 15–500)
Eosinophils Relative: 1.4 %
HCT: 35.3 % (ref 35.0–45.0)
Hemoglobin: 12.3 g/dL (ref 11.7–15.5)
Lymphs Abs: 1655 cells/uL (ref 850–3900)
MCH: 31.8 pg (ref 27.0–33.0)
MCHC: 34.8 g/dL (ref 32.0–36.0)
MCV: 91.2 fL (ref 80.0–100.0)
MPV: 9.2 fL (ref 7.5–12.5)
Monocytes Relative: 8.8 %
Neutro Abs: 3881 cells/uL (ref 1500–7800)
Neutrophils Relative %: 62.6 %
Platelets: 366 10*3/uL (ref 140–400)
RBC: 3.87 10*6/uL (ref 3.80–5.10)
RDW: 12.7 % (ref 11.0–15.0)
Total Lymphocyte: 26.7 %
WBC: 6.2 10*3/uL (ref 3.8–10.8)

## 2022-08-29 LAB — HEMOGLOBIN A1C
Hgb A1c MFr Bld: 6.6 % of total Hgb — ABNORMAL HIGH (ref <5.7)
Mean Plasma Glucose: 143 mg/dL
eAG (mmol/L): 7.9 mmol/L

## 2022-08-29 LAB — MICROALBUMIN / CREATININE URINE RATIO
Creatinine, Urine: 399 mg/dL — ABNORMAL HIGH (ref 20–275)
Microalb Creat Ratio: 29 mcg/mg creat (ref <30)
Microalb, Ur: 11.4 mg/dL

## 2022-09-05 ENCOUNTER — Encounter: Payer: Self-pay | Admitting: Family Medicine

## 2022-09-19 ENCOUNTER — Other Ambulatory Visit: Payer: Self-pay | Admitting: Family Medicine

## 2022-09-19 DIAGNOSIS — E782 Mixed hyperlipidemia: Secondary | ICD-10-CM

## 2022-09-19 DIAGNOSIS — I10 Essential (primary) hypertension: Secondary | ICD-10-CM

## 2022-10-17 ENCOUNTER — Telehealth: Payer: BC Managed Care – PPO | Admitting: Family Medicine

## 2022-10-17 DIAGNOSIS — B9689 Other specified bacterial agents as the cause of diseases classified elsewhere: Secondary | ICD-10-CM | POA: Diagnosis not present

## 2022-10-17 DIAGNOSIS — J019 Acute sinusitis, unspecified: Secondary | ICD-10-CM

## 2022-10-17 MED ORDER — DOXYCYCLINE HYCLATE 100 MG PO TABS: 100.0000 mg | ORAL_TABLET | Freq: Two times a day (BID) | ORAL | 0 refills | Status: AC

## 2022-10-17 NOTE — Progress Notes (Signed)
E-Visit for Sinus Problems  We are sorry that you are not feeling well.  Here is how we plan to help!  Based on what you have shared with me it looks like you have sinusitis.  Sinusitis is inflammation and infection in the sinus cavities of the head.  Based on your presentation I believe you most likely have Acute Bacterial Sinusitis.  This is an infection caused by bacteria and is treated with antibiotics. I have prescribed Doxycycline 182m by mouth twice a day for 10 days. You may use an oral decongestant such as Mucinex D or if you have glaucoma or high blood pressure use plain Mucinex. Saline nasal spray help and can safely be used as often as needed for congestion.  If you develop worsening sinus pain, fever or notice severe headache and vision changes, or if symptoms are not better after completion of antibiotic, please schedule an appointment with a health care provider.    Sinus infections are not as easily transmitted as other respiratory infection, however we still recommend that you avoid close contact with loved ones, especially the very young and elderly.  Remember to wash your hands thoroughly throughout the day as this is the number one way to prevent the spread of infection!  Home Care: Only take medications as instructed by your medical team. Complete the entire course of an antibiotic. Do not take these medications with alcohol. A steam or ultrasonic humidifier can help congestion.  You can place a towel over your head and breathe in the steam from hot water coming from a faucet. Avoid close contacts especially the very young and the elderly. Cover your mouth when you cough or sneeze. Always remember to wash your hands.  Get Help Right Away If: You develop worsening fever or sinus pain. You develop a severe head ache or visual changes. Your symptoms persist after you have completed your treatment plan.  Make sure you Understand these instructions. Will watch your  condition. Will get help right away if you are not doing well or get worse.  Thank you for choosing an e-visit.  Your e-visit answers were reviewed by a board certified advanced clinical practitioner to complete your personal care plan. Depending upon the condition, your plan could have included both over the counter or prescription medications.  Please review your pharmacy choice. Make sure the pharmacy is open so you can pick up prescription now. If there is a problem, you may contact your provider through [employer] have the prescription routed to another pharmacy.  Your safety is important to [place] If you have drug allergies check your prescription carefully.   For the next 24 hours you can use MyChart to ask questions about today's visit, request a non-urgent call back, or ask for a work or school excuse. You will get an email in the next two days asking about your experience. I hope that your e-visit has been valuable and will speed your recovery.    have provided 5 minutes of non face to face time during this encounter for chart review and documentation.

## 2022-10-30 ENCOUNTER — Encounter: Payer: Self-pay | Admitting: Family Medicine

## 2022-12-02 ENCOUNTER — Other Ambulatory Visit: Payer: Self-pay | Admitting: Family Medicine

## 2022-12-02 DIAGNOSIS — K219 Gastro-esophageal reflux disease without esophagitis: Secondary | ICD-10-CM

## 2022-12-13 ENCOUNTER — Other Ambulatory Visit: Payer: Self-pay | Admitting: Family Medicine

## 2022-12-15 ENCOUNTER — Other Ambulatory Visit: Payer: Self-pay

## 2023-01-06 ENCOUNTER — Other Ambulatory Visit: Payer: Self-pay | Admitting: Internal Medicine

## 2023-01-06 NOTE — Telephone Encounter (Signed)
Requested Prescriptions  Pending Prescriptions Disp Refills   amLODipine (NORVASC) 10 MG tablet [Pharmacy Med Name: AMLODIPINE BESYLATE 10MG  TABLETS] 90 tablet 0    Sig: TAKE 1 TABLET(10 MG) BY MOUTH DAILY     Cardiovascular: Calcium Channel Blockers 2 Passed - 01/06/2023  3:33 PM      Passed - Last BP in normal range    BP Readings from Last 1 Encounters:  08/28/22 132/74         Passed - Last Heart Rate in normal range    Pulse Readings from Last 1 Encounters:  08/28/22 93         Passed - Valid encounter within last 6 months    Recent Outpatient Visits           4 months ago Adult general medical exam   [facility] Danelle Berry, PA-C   4 months ago Fear of flying   [facility], Sheliah Mends, PA-C   6 months ago Neck pain on left side   [facility] Danelle Berry, PA-C   10 months ago Type 2 diabetes mellitus without complication, without long-term current use of insulin [facility])   [facility] Danelle Berry, PA-C   1 year ago Herpes genitalis in women   [facility] Danelle Berry, PA-C       Future Appointments             In 1 month Danelle Berry, PA-C [facility]

## 2023-02-03 ENCOUNTER — Telehealth: Payer: BC Managed Care – PPO | Admitting: Physician Assistant

## 2023-02-03 DIAGNOSIS — J019 Acute sinusitis, unspecified: Secondary | ICD-10-CM

## 2023-02-03 DIAGNOSIS — B9789 Other viral agents as the cause of diseases classified elsewhere: Secondary | ICD-10-CM

## 2023-02-03 MED ORDER — PREDNISONE 10 MG (21) PO TBPK: ORAL_TABLET | ORAL | 0 refills | Status: DC

## 2023-02-03 NOTE — Progress Notes (Signed)
E-Visit for Sinus Problems  We are sorry that you are not feeling well.  Here is how we plan to help!  Based on what you have shared with me it looks like you have sinusitis.  Sinusitis is inflammation and infection in the sinus cavities of the head.  Based on your presentation I believe you most likely have Acute Viral Sinusitis.This is an infection most likely caused by a virus. There is not specific treatment for viral sinusitis other than to help you with the symptoms until the infection runs its course.  You may use an oral decongestant such as Mucinex D or if you have glaucoma or high blood pressure use plain Mucinex. Saline nasal spray help and can safely be used as often as needed for congestion, I have prescribed: a short course of prednisone to reduce sinus inflammation and hopefully expedite recovery.  Some authorities believe that zinc sprays or the use of Echinacea may shorten the course of your symptoms.  Sinus infections are not as easily transmitted as other respiratory infection, however we still recommend that you avoid close contact with loved ones, especially the very young and elderly.  Remember to wash your hands thoroughly throughout the day as this is the number one way to prevent the spread of infection!  Home Care: Only take medications as instructed by your medical team. Do not take these medications with alcohol. A steam or ultrasonic humidifier can help congestion.  You can place a towel over your head and breathe in the steam from hot water coming from a faucet. Avoid close contacts especially the very young and the elderly. Cover your mouth when you cough or sneeze. Always remember to wash your hands.  Get Help Right Away If: You develop worsening fever or sinus pain. You develop a severe head ache or visual changes. Your symptoms persist after you have completed your treatment plan.  Make sure you Understand these instructions. Will watch your condition. Will  get help right away if you are not doing well or get worse.   Thank you for choosing an e-visit.  Your e-visit answers were reviewed by a board certified advanced clinical practitioner to complete your personal care plan. Depending upon the condition, your plan could have included both over the counter or prescription medications.  Please review your pharmacy choice. Make sure the pharmacy is open so you can pick up prescription now. If there is a problem, you may contact your provider through [employer] and have the prescription routed to another pharmacy.  Your safety is important to [place]. If you have drug allergies check your prescription carefully.   For the next 24 hours you can use MyChart to ask questions about today's visit, request a non-urgent call back, or ask for a work or school excuse. You will get an email in the next two days asking about your experience. I hope that your e-visit has been valuable and will speed your recovery.

## 2023-02-03 NOTE — Progress Notes (Signed)
I have spent 5 minutes in review of e-visit questionnaire, review and updating patient chart, medical decision making and response to patient.   Scout Gumbs Cody Alyzza Andringa, PA-C    

## 2023-02-27 ENCOUNTER — Ambulatory Visit: Payer: BC Managed Care – PPO | Admitting: Family Medicine

## 2023-03-02 ENCOUNTER — Ambulatory Visit: Payer: BC Managed Care – PPO | Admitting: Family Medicine

## 2023-03-09 ENCOUNTER — Encounter: Payer: Self-pay | Admitting: Family Medicine

## 2023-03-09 ENCOUNTER — Ambulatory Visit: Payer: BC Managed Care – PPO | Admitting: Family Medicine

## 2023-03-09 VITALS — BP 142/84 | HR 81 | Temp 98.2°F | Resp 16 | Ht 64.0 in | Wt 169.0 lb

## 2023-03-09 DIAGNOSIS — E119 Type 2 diabetes mellitus without complications: Secondary | ICD-10-CM

## 2023-03-09 DIAGNOSIS — K219 Gastro-esophageal reflux disease without esophagitis: Secondary | ICD-10-CM | POA: Diagnosis not present

## 2023-03-09 DIAGNOSIS — E782 Mixed hyperlipidemia: Secondary | ICD-10-CM

## 2023-03-09 DIAGNOSIS — Z5181 Encounter for therapeutic drug level monitoring: Secondary | ICD-10-CM

## 2023-03-09 DIAGNOSIS — I10 Essential (primary) hypertension: Secondary | ICD-10-CM | POA: Diagnosis not present

## 2023-03-09 DIAGNOSIS — M5020 Other cervical disc displacement, unspecified cervical region: Secondary | ICD-10-CM

## 2023-03-09 MED ORDER — BACLOFEN 5 MG PO TABS: 5.0000 mg | ORAL_TABLET | Freq: Three times a day (TID) | ORAL | 2 refills | Status: AC | PRN

## 2023-03-09 MED ORDER — AMLODIPINE BESYLATE-VALSARTAN 5-160 MG PO TABS: 1.0000 | ORAL_TABLET | Freq: Every day | ORAL | 0 refills | Status: DC

## 2023-03-09 NOTE — Progress Notes (Signed)
Name: Carrie Cross   MRN: [id number]    DOB: 03-30-1976   Date:03/09/2023       Progress Note  Chief Complaint  Patient presents with   Follow-up   Hypertension   Hyperlipidemia   Diabetes     Subjective:   Carrie Cross is a 47 y.o. female, presents to clinic for routine f/up She states she has "eaten bad" and been stressed for several months  Hypertension:  Currently managed on norvasc 10 and metoprolol 100  Pt reports good med compliance and denies any SE.   Blood pressure today is high BP Readings from Last 6 Encounters:  03/09/23 (!) 148/92  08/28/22 132/74  07/17/22 (!) 166/95  07/07/22 (!) 165/93  02/13/22 134/78  10/25/21 (!) 162/86   Pt denies CP, SOB, exertional sx, LE edema, palpitation, Ha's, visual disturbances, lightheadedness, hypotension, syncope. Dietary efforts for BP?  Nothing recently   Pulse Readings from Last 3 Encounters:  03/09/23 81  08/28/22 93  07/17/22 84      DM:   Pt managing DM with diet Denies: Polyuria, polydipsia, vision changes, neuropathy, hypoglycemia Recent pertinent labs: Lab Results  Component Value Date   HGBA1C 6.6 (H) 08/28/2022   HGBA1C 6.1 (H) 02/13/2022   HGBA1C 5.9 (H) 05/02/2021   Lab Results  Component Value Date   MICROALBUR 11.4 08/28/2022   LDLCALC 65 02/13/2022   CREATININE 0.63 08/28/2022   Standard of care and health maintenance: Urine Microalbumin:  due Foot exam:  dec DM eye exam:  due and scheduled this month at [facility] ACEI/ARB:  not on currently - discussed ACEI/ARB adding to meds Statin:  lipitor   On lipitor - good compliance, last labs were well controlled, due Lab Results  Component Value Date   CHOL 150 02/13/2022   HDL 72 02/13/2022   LDLCALC 65 02/13/2022   TRIG 53 02/13/2022   CHOLHDL 2.1 02/13/2022   Chronic neck pain s/p surgery, baclofen is very helpful when it flares up- she asks for refill  GERD - still needing PPI daily     Current Outpatient  Medications:    amLODipine (NORVASC) 10 MG tablet, TAKE 1 TABLET(10 MG) BY MOUTH DAILY, Disp: 90 tablet, Rfl: 1   atorvastatin (LIPITOR) 20 MG tablet, TAKE 1 TABLET AT BEDTIME, Disp: 90 tablet, Rfl: 1   Baclofen 5 MG TABS, Take 5 mg by mouth 3 (three) times daily as needed (muscle stiffness, pain or spasms). Sedating - do not take and drive or operate heavy machinery, Disp: 30 tablet, Rfl: 0   busPIRone (BUSPAR) 5 MG tablet, Take 1-3 tablets (5-15 mg total) by mouth 2 (two) times daily as needed., Disp: 30 tablet, Rfl: 0   clobetasol ointment (TEMOVATE) 0.05 %, Apply 1 application topically 2 (two) times daily. Apply to affected area, Disp: 30 g, Rfl: 2   fluticasone (FLONASE) 50 MCG/ACT nasal spray, USE 2 SPRAYS IN EACH       NOSTRIL DAILY., Disp: 48 g, Rfl: 1   hydrOXYzine (VISTARIL) 25 MG capsule, Take 1 capsule (25 mg total) by mouth daily as needed for anxiety., Disp: 15 capsule, Rfl: 0   levonorgestrel (MIRENA) 20 MCG/24HR IUD, by Intrauterine route. Reported on 12/13/2015, Disp: , Rfl:    metoprolol succinate (TOPROL-XL) 100 MG 24 hr tablet, TAKE 1 TABLET DAILY WITH ORIMMEDIATELY FOLLOWING A    MEAL, Disp: 90 tablet, Rfl: 1   Multiple Vitamin (MULTIVITAMIN) capsule, Take 1 capsule by mouth daily., Disp: , Rfl:  omeprazole (PRILOSEC) 20 MG capsule, TAKE 1 CAPSULE DAILY BEFOREBREAKFAST, Disp: 90 capsule, Rfl: 3   valACYclovir (VALTREX) 500 MG tablet, TAKE 1 TABLET DAILY, Disp: 90 tablet, Rfl: 3   predniSONE (STERAPRED UNI-PAK 21 TAB) 10 MG (21) TBPK tablet, Take following package directions, Disp: 21 tablet, Rfl: 0  Patient Active Problem List   Diagnosis Date Noted   Gastric erythema 07/17/2022   Chronic GERD 07/17/2022   Other cervical disc displacement, unspecified cervical region 07/10/2022   Colon cancer screening    Adenomatous polyp of sigmoid colon    Inflammatory arthritis 05/02/2021   Diabetes (HCC) 05/02/2021   Muscle cramps 02/18/2020   Tingling 02/18/2020   IUD  (intrauterine device) in place 01/27/2020   Insomnia 11/18/2019   Prediabetes 04/25/2019   Herpes simplex 08/10/2015   Eczema 05/11/2015   Hypertension    HLD (hyperlipidemia)    GERD (gastroesophageal reflux disease)    Allergic rhinitis     Past Surgical History:  Procedure Laterality Date   BACK SURGERY  [id number]   disc shaved down   CERVICAL DISC ARTHROPLASTY  [id number]   COLONOSCOPY WITH PROPOFOL N/A 10/25/2021   Procedure: COLONOSCOPY WITH PROPOFOL;  Surgeon: Toney Reil, MD;  Location: [facility] END[facility]COPY;  Service: Gastroenterology;  Laterality: N/A;   ESOPHAGOGASTRODUODENOSCOPY (EGD) WITH PROPOFOL N/A 07/17/2022   Procedure: ESOPHAGOGASTRODUODENOSCOPY (EGD) WITH BIOPSY;  Surgeon: Toney Reil, MD;  Location: [facility];  Service: Endoscopy;  Laterality: N/A;  leave patient second    Family History  Problem Relation Age of Onset   Breast cancer Maternal Grandmother 80   Heart disease Maternal Grandfather    Stroke Neg Hx    Diabetes Neg Hx    Ovarian cancer Neg Hx    Colon cancer Neg Hx     Social History   Tobacco Use   Smoking status: Former    Packs/day: 0.50    Years: 7.00    Additional pack years: 0.00    Total pack years: 3.50    Types: Cigarettes    Quit date: 06/02/2011    Years since quitting: 11.7   Smokeless tobacco: Never  Vaping Use   Vaping Use: Never used  Substance Use Topics   Alcohol use: Yes    Alcohol/week: 3.0 standard drinks of alcohol    Types: 3 Glasses of wine per week   Drug use: No     No Known Allergies  Health Maintenance  Topic Date Due   OPHTHALMOLOGY EXAM  Never done   HEMOGLOBIN A1C  02/27/2023   COVID-19 Vaccine (6 - 2023-24 season) 03/25/2023 (Originally 08/30/2022)   INFLUENZA VACCINE  04/02/2023   MAMMOGRAM  07/17/2023   Diabetic kidney evaluation - eGFR measurement  08/29/2023   Diabetic kidney evaluation - Urine ACR  08/29/2023   FOOT EXAM  08/29/2023   Colonoscopy  10/25/2024   PAP  SMEAR-Modifier  01/31/2026   DTaP/Tdap/Td (3 - Td or Tdap) 08/07/2030   Hepatitis C Screening  Completed   HIV Screening  Addressed   HPV VACCINES  Aged Out    Chart Review Today: I personally reviewed active problem list, medication list, allergies, family history, social history, health maintenance, notes from last encounter, lab results, imaging with the patient/caregiver today.   Review of Systems  Constitutional: Negative.   HENT: Negative.    Eyes: Negative.   Respiratory: Negative.    Cardiovascular: Negative.   Gastrointestinal: Negative.   Endocrine: Negative.   Genitourinary: Negative.   Musculoskeletal: Negative.  Skin: Negative.   Allergic/Immunologic: Negative.   Neurological: Negative.   Hematological: Negative.   Psychiatric/Behavioral: Negative.    All other systems reviewed and are negative.    Objective:   Vitals:   03/09/23 1012  BP: (!) 148/92  Pulse: 81  Resp: 16  Temp: 98.2 F (36.8 C)  TempSrc: Oral  SpO2: 99%  Weight: 169 lb (76.7 kg)  Height: 5\' 4"  (1.626 m)    Body mass index is 29.01 kg/m.  Physical Exam Vitals and nursing note reviewed.  Constitutional:      General: She is not in acute distress.    Appearance: Normal appearance. She is well-developed and well-groomed. She is not ill-appearing, toxic-appearing or diaphoretic.  HENT:     Head: Normocephalic and atraumatic.     Right Ear: External ear normal.     Left Ear: External ear normal.     Nose: Nose normal.  Eyes:     General:        Right eye: No discharge.        Left eye: No discharge.     Conjunctiva/sclera: Conjunctivae normal.  Neck:     Trachea: No tracheal deviation.  Cardiovascular:     Rate and Rhythm: Normal rate and regular rhythm.     Pulses: Normal pulses.     Heart sounds: Normal heart sounds. No murmur heard.    No friction rub. No gallop.  Pulmonary:     Effort: Pulmonary effort is normal. No respiratory distress.     Breath sounds: Normal  breath sounds. No stridor.  Musculoskeletal:     Right lower leg: No edema.     Left lower leg: No edema.  Skin:    General: Skin is warm and dry.     Findings: No rash.  Neurological:     Mental Status: She is alert. Mental status is at baseline.     Motor: No abnormal muscle tone.     Coordination: Coordination normal.     Gait: Gait normal.  Psychiatric:        Attention and Perception: Attention normal.        Mood and Affect: Affect normal. Mood is anxious. Mood is not depressed.        Behavior: Behavior normal. Behavior is cooperative.         Assessment & Plan:   Problem List Items Addressed This Visit       Cardiovascular and Mediastinum   Hypertension - Primary    BP elevated today - pt states its only [facility] doctors offices, but she does not monitor it at home Encouraged her to get a home BP cuff/monitor BP recheck pending She is a little anxious today in office Managed on norvasc 10 mg and metoprolol BP Readings from Last 3 Encounters:  03/09/23 (!) 148/92  08/28/22 132/74  07/17/22 (!) 166/95   Pulse Readings from Last 3 Encounters:  03/09/23 81  08/28/22 93  07/17/22 84  Will change med to norvasc-valsartan and do BP recheck in 3-4 weeks      Relevant Medications   amLODipine-valsartan (EXFORGE) 5-160 MG tablet   Other Relevant Orders   COMPLETE METABOLIC PANEL WITH GFR     Digestive   GERD (gastroesophageal reflux disease)    Sx continue to be stable and well controlled with long term daily PPI Omeprazole Rx         Endocrine   Diabetes (HCC)    Pt managing DM with diet/lifestyle - she states  she has been eating poorly for a few months Not of meds, she does want to avoid medications if possible, previously A1C responded and improved with diet efforts Blood sugars - not checking/monitoring Denies: Polyuria, polydipsia, vision changes, neuropathy, hypoglycemia Recent pertinent labs: Lab Results  Component Value Date   HGBA1C 6.6 (H)  08/28/2022   HGBA1C 6.1 (H) 02/13/2022   HGBA1C 5.9 (H) 05/02/2021    Standard of care and health maintenance: Urine Microalbumin:  due Foot exam:  UTD DM eye exam:  due - she is scheduled next week at [facility] ACEI/ARB:  discussed today - added on and changed BP meds Statin:  yes        Relevant Medications   amLODipine-valsartan (EXFORGE) 5-160 MG tablet   Other Relevant Orders   COMPLETE METABOLIC PANEL WITH GFR   Microalbumin / creatinine urine ratio   Hemoglobin A1c     Musculoskeletal and Integument   Other cervical disc displacement, unspecified cervical region    Refill on baclofen - it was helpful for intermittent sx      Relevant Medications   Baclofen 5 MG TABS     Other   HLD (hyperlipidemia)    Has been stable and well controlled on lipitor Due for labs today Good med compliance and no med SE or concerns      Relevant Medications   Lipitor 20 mg daily qhs   Other Relevant Orders   COMPLETE METABOLIC PANEL WITH GFR   Lipid panel   Other Visit Diagnoses     Encounter for medication monitoring       Relevant Orders   COMPLETE METABOLIC PANEL WITH GFR   Lipid panel   Microalbumin / creatinine urine ratio   Hemoglobin A1c        Return in about 3 months (around 06/09/2023) for BP DM (BP nurse check in 3-4 weeks) .   Danelle Berry, PA-C 03/09/23 10:17 AM

## 2023-03-09 NOTE — Assessment & Plan Note (Signed)
Pt managing DM with diet/lifestyle - she states she has been eating poorly for a few months Not of meds, she does want to avoid medications if possible, previously A1C responded and improved with diet efforts Blood sugars - not checking/monitoring Denies: Polyuria, polydipsia, vision changes, neuropathy, hypoglycemia Recent pertinent labs: Lab Results  Component Value Date   HGBA1C 6.6 (H) 08/28/2022   HGBA1C 6.1 (H) 02/13/2022   HGBA1C 5.9 (H) 05/02/2021    Standard of care and health maintenance: Urine Microalbumin:  due Foot exam:  UTD DM eye exam:  due - she is scheduled next week at  eye ACEI/ARB:  discussed today - added on and changed BP meds Statin:  yes

## 2023-03-09 NOTE — Assessment & Plan Note (Signed)
Refill on baclofen - it was helpful for intermittent sx

## 2023-03-09 NOTE — Assessment & Plan Note (Signed)
Has been stable and well controlled on lipitor Due for labs today Good med compliance and no med SE or concerns

## 2023-03-09 NOTE — Assessment & Plan Note (Signed)
Sx continue to be stable and well controlled with long term daily PPI

## 2023-03-09 NOTE — Assessment & Plan Note (Signed)
BP elevated today - pt states its only [facility], but she does not monitor it at home Encouraged her to get a home BP cuff/monitor BP recheck pending She is a little anxious today in office Managed on norvasc 10 mg and metoprolol BP Readings from Last 3 Encounters:  03/09/23 (!) 148/92  08/28/22 132/74  07/17/22 (!) 166/95   Pulse Readings from Last 3 Encounters:  03/09/23 81  08/28/22 93  07/17/22 84   Will change med to norvasc-valsartan and do BP recheck in 3-4 weeks

## 2023-03-10 LAB — COMPLETE METABOLIC PANEL WITH GFR
AG Ratio: 1.9 (calc) (ref 1.0–2.5)
ALT: 18 U/L (ref 6–29)
AST: 13 U/L (ref 10–35)
Albumin: 4.6 g/dL (ref 3.6–5.1)
Alkaline phosphatase (APISO): 79 U/L (ref 31–125)
BUN: 12 mg/dL (ref 7–25)
CO2: 26 mmol/L (ref 20–32)
Calcium: 9.7 mg/dL (ref 8.6–10.2)
Chloride: 103 mmol/L (ref 98–110)
Creat: 0.61 mg/dL (ref 0.50–0.99)
Globulin: 2.4 g/dL (calc) (ref 1.9–3.7)
Glucose, Bld: 108 mg/dL — ABNORMAL HIGH (ref 65–99)
Potassium: 3.7 mmol/L (ref 3.5–5.3)
Sodium: 138 mmol/L (ref 135–146)
Total Bilirubin: 0.5 mg/dL (ref 0.2–1.2)
Total Protein: 7 g/dL (ref 6.1–8.1)
eGFR: 112 mL/min/{1.73_m2} (ref >=60)

## 2023-03-10 LAB — MICROALBUMIN / CREATININE URINE RATIO
Creatinine, Urine: 221 mg/dL (ref 20–275)
Microalb Creat Ratio: 22 mg/g creat (ref <30)
Microalb, Ur: 4.8 mg/dL

## 2023-03-10 LAB — LIPID PANEL
Cholesterol: 151 mg/dL (ref <200)
HDL: 67 mg/dL (ref >=50)
LDL Cholesterol (Calc): 71 mg/dL (calc)
Non-HDL Cholesterol (Calc): 84 mg/dL (calc) (ref <130)
Total CHOL/HDL Ratio: 2.3 (calc) (ref <5.0)
Triglycerides: 52 mg/dL (ref <150)

## 2023-03-10 LAB — HEMOGLOBIN A1C
Hgb A1c MFr Bld: 6.8 % of total Hgb — ABNORMAL HIGH (ref <5.7)
Mean Plasma Glucose: 148 mg/dL
eAG (mmol/L): 8.2 mmol/L

## 2023-03-19 ENCOUNTER — Other Ambulatory Visit: Payer: Self-pay | Admitting: Physician Assistant

## 2023-03-19 DIAGNOSIS — I10 Essential (primary) hypertension: Secondary | ICD-10-CM

## 2023-03-19 DIAGNOSIS — E782 Mixed hyperlipidemia: Secondary | ICD-10-CM

## 2023-03-20 NOTE — Telephone Encounter (Signed)
Requested Prescriptions  Pending Prescriptions Disp Refills   fluticasone (FLONASE) 50 MCG/ACT nasal spray [Pharmacy Med Name: FLUTICASONE  SPR RX] 48 g 0    Sig: USE 2 SPRAYS IN EACH       NOSTRIL DAILY.     Ear, Nose, and Throat: Nasal Preparations - Corticosteroids Passed - 03/19/2023  8:06 AM      Passed - Valid encounter within last 12 months    Recent Outpatient Visits           1 week ago Primary hypertension   [place] [facility] Danelle Berry, PA-C   6 months ago Adult general medical exam   [facility] Danelle Berry, PA-C   7 months ago Fear of flying   [facility], Sheliah Mends, [place]   8 months ago Neck pain on left side   [facility] Danelle Berry, PA-C   1 year ago Type 2 diabetes mellitus without complication, without long-term current use of insulin [facility])   [facility] Danelle Berry, PA-C       Future Appointments             In 2 months Danelle Berry, PA-C [facility]             metoprolol succinate (TOPROL-XL) 100 MG 24 hr tablet [Pharmacy Med Name: METOPROL SUC TAB 100MG  ER] 90 tablet 0    Sig: TAKE 1 TABLET DAILY WITH ORIMMEDIATELY FOLLOWING A    MEAL     Cardiovascular:  Beta Blockers Failed - 03/19/2023  8:06 AM      Failed - Last BP in normal range    BP Readings from Last 1 Encounters:  03/09/23 (!) 142/84         Passed - Last Heart Rate in normal range    Pulse Readings from Last 1 Encounters:  03/09/23 81         Passed - Valid encounter within last 6 months    Recent Outpatient Visits           1 week ago Primary hypertension   [facility] Danelle Berry, PA-C   6 months ago Adult general medical exam   [facility] Danelle Berry, PA-C   7 months ago Fear of flying   [facility] [place], [place], PA-C   8  months ago Neck pain on left side   [facility] Danelle Berry, PA-C   1 year ago Type 2 diabetes mellitus without complication, without long-term current use of insulin [facility])   [place] [facility] Danelle Berry, PA-C       Future Appointments             In 2 months Danelle Berry, PA-C  [facility]             atorvastatin (LIPITOR) 20 MG tablet [Pharmacy Med Name: ATORVASTATIN TAB 20MG ] 90 tablet 0    Sig: TAKE 1 TABLET AT BEDTIME     Cardiovascular:  Antilipid - Statins Failed - 03/19/2023  8:06 AM      Failed - Lipid Panel in normal range within the last 12 months    Cholesterol, Total  Date Value Ref Range Status  01/09/2016 144 100 - 199 mg/dL Final   Cholesterol  Date Value Ref Range Status  03/09/2023 151 <200  mg/dL Final   LDL Cholesterol (Calc)  Date Value Ref Range Status  03/09/2023 71 mg/dL (calc) Final    Comment:    Reference range: <100 . Desirable range <100 mg/dL for primary prevention;   <70 mg/dL for patients with CHD or diabetic patients  with > or = 2 CHD risk factors. Marland Kitchen LDL-C is now calculated using the Martin-Hopkins  calculation, which is a validated novel method providing  better accuracy than the Friedewald equation in the  estimation of LDL-C.  Horald Pollen et al. Lenox Ahr. 3244;010(27): 2061-2068  (http://education.[employer].com/faq/FAQ164)    HDL  Date Value Ref Range Status  03/09/2023 67 > OR = 50 mg/dL Final  25/36/6440 57 >34 mg/dL Final   Triglycerides  Date Value Ref Range Status  03/09/2023 52 <150 mg/dL Final         Passed - Patient is not pregnant      Passed - Valid encounter within last 12 months    Recent Outpatient Visits           1 week ago Primary hypertension   [place] Danelle Berry, PA-C   6 months ago Adult general medical exam   [facility] Danelle Berry, PA-C   7 months  ago Fear of flying   [facility] [place], [place], PA-C   8 months ago Neck pain on left side   [facility] Danelle Berry, PA-C   1 year ago Type 2 diabetes mellitus without complication, without long-term current use of insulin [facility])   [facility] Danelle Berry, PA-C       Future Appointments             In 2 months Danelle Berry, PA-C [facility]

## 2023-04-02 ENCOUNTER — Ambulatory Visit: Payer: BC Managed Care – PPO

## 2023-04-02 VITALS — BP 126/80

## 2023-04-02 DIAGNOSIS — I10 Essential (primary) hypertension: Secondary | ICD-10-CM

## 2023-04-02 NOTE — Progress Notes (Signed)
Patient here for blood pressure check since changing med from last visit:  BP elevated today - pt states its only [facility], but she does not monitor it at home Encouraged her to get a home BP cuff/monitor BP recheck pending She is a little anxious today in office Managed on norvasc 10 mg and metoprolol    BP Readings from Last 3 Encounters:  03/09/23 (!) 148/92  08/28/22 132/74  07/17/22 (!) 166/95       Pulse Readings from Last 3 Encounters:  03/09/23 81  08/28/22 93  07/17/22 84  Will change med to norvasc-valsartan and do BP recheck in 3-4 weeks  Patient denies any side effects and blood pressure today is 126/80.  Patient told to continue current regimen.

## 2023-04-06 ENCOUNTER — Ambulatory Visit: Payer: BC Managed Care – PPO

## 2023-05-07 ENCOUNTER — Other Ambulatory Visit: Payer: Self-pay | Admitting: Family Medicine

## 2023-05-07 DIAGNOSIS — I10 Essential (primary) hypertension: Secondary | ICD-10-CM

## 2023-05-07 MED ORDER — AMLODIPINE BESYLATE-VALSARTAN 5-160 MG PO TABS: 1.0000 | ORAL_TABLET | Freq: Every day | ORAL | 0 refills | Status: DC

## 2023-05-07 NOTE — Progress Notes (Signed)
Med refill sent [facility] to [facility]

## 2023-05-13 ENCOUNTER — Telehealth: Payer: BC Managed Care – PPO | Admitting: Physician Assistant

## 2023-05-13 DIAGNOSIS — L239 Allergic contact dermatitis, unspecified cause: Secondary | ICD-10-CM | POA: Diagnosis not present

## 2023-05-13 MED ORDER — PREDNISONE 10 MG PO TABS: ORAL_TABLET | ORAL | 0 refills | Status: AC

## 2023-05-13 NOTE — Progress Notes (Signed)
E Visit for Rash  We are sorry that you are not feeling well. Here is how we plan to help!  Based on what you shared with me it looks like you have contact dermatitis.  Contact dermatitis is a skin rash caused by something that touches the skin and causes irritation or inflammation.  Your skin may be red, swollen, dry, cracked, and itch.  The rash should go away in a few days but can last a few weeks.  If you get a rash, it's important to figure out what caused it so the irritant can be avoided in the future. and I am prescribing a two week course of steroids (37 tablets of 10 mg prednisone).  Days 1-4 take 4 tablets (40 mg) daily  Days 5-8 take 3 tablets (30 mg) daily, Days 9-11 take 2 tablets (20 mg) daily, Days 12-14 take 1 tablet (10 mg) daily.       HOME CARE:  Take cool showers and avoid direct sunlight. Apply cool compress or wet dressings. Take a bath in an oatmeal bath.  Sprinkle content of one Aveeno packet under running faucet with comfortably warm water.  Bathe for 15-20 minutes, 1-2 times daily.  Pat dry with a towel. Do not rub the rash. Use hydrocortisone cream. Take an antihistamine like Benadryl for widespread rashes that itch.  The adult dose of Benadryl is 25-50 mg by mouth 4 times daily. Caution:  This type of medication may cause sleepiness.  Do not drink alcohol, drive, or operate dangerous machinery while taking antihistamines.  Do not take these medications if you have prostate enlargement.  Read package instructions thoroughly on all medications that you take.  GET HELP RIGHT AWAY IF:  Symptoms don't go away after treatment. Severe itching that persists. If you rash spreads or swells. If you rash begins to smell. If it blisters and opens or develops a yellow-brown crust. You develop a fever. You have a sore throat. You become short of breath.  MAKE SURE YOU:  Understand these instructions. Will watch your condition. Will get help right away if you are not  doing well or get worse.  Thank you for choosing an e-visit.  Your e-visit answers were reviewed by a board certified advanced clinical practitioner to complete your personal care plan. Depending upon the condition, your plan could have included both over the counter or prescription medications.  Please review your pharmacy choice. Make sure the pharmacy is open so you can pick up prescription now. If there is a problem, you may contact your provider through MyChart messaging and have the prescription routed to another pharmacy.  Your safety is important to us. If you have drug allergies check your prescription carefully.   For the next 24 hours you can use MyChart to ask questions about today's visit, request a non-urgent call back, or ask for a work or school excuse. You will get an email in the next two days asking about your experience. I hope that your e-visit has been valuable and will speed your recovery.  

## 2023-05-13 NOTE — Progress Notes (Signed)
I have spent 5 minutes in review of e-visit questionnaire, review and updating patient chart, medical decision making and response to patient.   William Cody Martin, PA-C    

## 2023-06-10 ENCOUNTER — Encounter: Payer: Self-pay | Admitting: Physician Assistant

## 2023-06-10 ENCOUNTER — Ambulatory Visit (INDEPENDENT_AMBULATORY_CARE_PROVIDER_SITE_OTHER): Payer: BC Managed Care – PPO | Admitting: Physician Assistant

## 2023-06-10 VITALS — BP 162/90 | HR 88 | Temp 98.1°F | Resp 16 | Ht 64.0 in | Wt 169.5 lb

## 2023-06-10 DIAGNOSIS — I10 Essential (primary) hypertension: Secondary | ICD-10-CM

## 2023-06-10 DIAGNOSIS — E785 Hyperlipidemia, unspecified: Secondary | ICD-10-CM | POA: Diagnosis not present

## 2023-06-10 DIAGNOSIS — E119 Type 2 diabetes mellitus without complications: Secondary | ICD-10-CM | POA: Diagnosis not present

## 2023-06-10 DIAGNOSIS — J069 Acute upper respiratory infection, unspecified: Secondary | ICD-10-CM | POA: Diagnosis not present

## 2023-06-10 DIAGNOSIS — E782 Mixed hyperlipidemia: Secondary | ICD-10-CM

## 2023-06-10 LAB — POCT INFLUENZA A/B
Influenza A, POC: NEGATIVE
Influenza B, POC: NEGATIVE

## 2023-06-10 MED ORDER — ATORVASTATIN CALCIUM 20 MG PO TABS: 20.0000 mg | ORAL_TABLET | Freq: Every day | ORAL | 1 refills | Status: DC

## 2023-06-10 MED ORDER — METOPROLOL SUCCINATE ER 100 MG PO TB24: ORAL_TABLET | ORAL | 1 refills | Status: DC

## 2023-06-10 NOTE — Progress Notes (Signed)
Established Patient Office Visit  Name: Carrie Cross   MRN: [id number]    DOB: 12/23/1975   Date:06/10/2023  Today's Provider: Jacquelin Hawking, MHS, PA-C Introduced myself to the patient as a PA-C and provided education on APPs in clinical practice.         Subjective  Chief Complaint  Chief Complaint  Patient presents with   Hypertension   Diabetes   Sinusitis    Facial pain, congested onset few days ago    HPI  HYPERTENSION / HYPERLIPIDEMIA She has not taken her combo pill in few weeks - she got her medication bottles mixed up but has restarted over the past few days  Satisfied with current treatment? yes Duration of hypertension: years BP monitoring frequency: not checking BP range:  BP medication side effects: no Past BP meds: Metoprolol, Amlodipine-Valsartan  Duration of hyperlipidemia: years Cholesterol medication side effects: no Cholesterol supplements: none Past cholesterol medications: atorvastain (lipitor) Medication compliance: good compliance Aspirin: no Recent stressors: no Recurrent headaches: yes- thinks this is due to sinus infection  Visual changes: no Palpitations: no Dyspnea: no Chest pain: no Lower extremity edema: no Dizzy/lightheaded: no  Diabetes, Type 2 - Last A1c 6.8 - Medications: none at this time  - Compliance: NA - Checking BG at home: not checking at home - Eye exam: reviewed importance of diabetes eye exams annually  - Foot exam: UTD - Microalbumin: UTD - Statin: on statin  - PNA vaccine: NA  - Denies symptoms of hypoglycemia, polyuria, polydipsia, numbness extremities, foot ulcers/trauma   Sinusitis concerns  Onset: sudden  Duration: started about 3 days ago  Associated symptoms: nasal congestion, sinus pressure, headaches, dry cough  She denies fevers, chills, myalgias, nausea, vomiting,diarrhea  Interventions: Mucinex, severe sinus pill, nasal lavage  Recent sick contacts: she works in an [employer] school   Recent travel: none COVID testing: she has not tested for COVID at home     Patient Active Problem List   Diagnosis Date Noted   Gastric erythema 07/17/2022   Chronic GERD 07/17/2022   Other cervical disc displacement, unspecified cervical region 07/10/2022   Colon cancer screening    Adenomatous polyp of sigmoid colon    Inflammatory arthritis 05/02/2021   Diabetes (HCC) 05/02/2021   Muscle cramps 02/18/2020   Tingling 02/18/2020   IUD (intrauterine device) in place 01/27/2020   Insomnia 11/18/2019   Herpes simplex 08/10/2015   Eczema 05/11/2015   Hypertension    HLD (hyperlipidemia)    GERD (gastroesophageal reflux disease)    Allergic rhinitis     Past Surgical History:  Procedure Laterality Date   BACK SURGERY  [id number]   disc shaved down   CERVICAL DISC ARTHROPLASTY  [id number]   COLONOSCOPY WITH PROPOFOL N/A 10/25/2021   Procedure: COLONOSCOPY WITH PROPOFOL;  Surgeon: Toney Reil, MD;  Location: [facility] ENDOSCOPY;  Service: Gastroenterology;  Laterality: N/A;   ESOPHAGOGASTRODUODENOSCOPY (EGD) WITH PROPOFOL N/A 07/17/2022   Procedure: ESOPHAGOGASTRODUODENOSCOPY (EGD) WITH BIOPSY;  Surgeon: Toney Reil, MD;  Location: [facility];  Service: Endoscopy;  Laterality: N/A;  leave patient second    Family History  Problem Relation Age of Onset   Breast cancer Maternal Grandmother 80   Heart disease Maternal Grandfather    Stroke Neg Hx    Diabetes Neg Hx    Ovarian cancer Neg Hx    Colon cancer Neg Hx     Social History   Tobacco Use  Smoking status: Former    Current packs/day: 0.00    Average packs/day: 0.5 packs/day for 7.0 years (3.5 ttl pk-yrs)    Types: Cigarettes    Start date: 06/01/2004    Quit date: 06/02/2011    Years since quitting: 12.0   Smokeless tobacco: Never  Substance Use Topics   Alcohol use: Yes    Alcohol/week: 3.0 standard drinks of alcohol    Types: 3 Glasses of wine per week     Current Outpatient  Medications:    amLODipine-valsartan (EXFORGE) 5-160 MG tablet, Take 1 tablet by mouth daily., Disp: 90 tablet, Rfl: 0   Baclofen 5 MG TABS, Take 1 tablet (5 mg total) by mouth 3 (three) times daily as needed (muscle stiffness, pain or spasms). Sedating - do not take and drive or operate heavy machinery, Disp: 90 tablet, Rfl: 2   busPIRone (BUSPAR) 5 MG tablet, Take 1-3 tablets (5-15 mg total) by mouth 2 (two) times daily as needed., Disp: 30 tablet, Rfl: 0   clobetasol ointment (TEMOVATE) 0.05 %, Apply 1 application topically 2 (two) times daily. Apply to affected area, Disp: 30 g, Rfl: 2   fluticasone (FLONASE) 50 MCG/ACT nasal spray, USE 2 SPRAYS IN EACH       NOSTRIL DAILY., Disp: 48 g, Rfl: 0   hydrOXYzine (VISTARIL) 25 MG capsule, Take 1 capsule (25 mg total) by mouth daily as needed for anxiety., Disp: 15 capsule, Rfl: 0   levonorgestrel (MIRENA) 20 MCG/24HR IUD, by Intrauterine route. Reported on 12/13/2015, Disp: , Rfl:    Multiple Vitamin (MULTIVITAMIN) capsule, Take 1 capsule by mouth daily., Disp: , Rfl:    omeprazole (PRILOSEC) 20 MG capsule, TAKE 1 CAPSULE DAILY BEFOREBREAKFAST, Disp: 90 capsule, Rfl: 3   valACYclovir (VALTREX) 500 MG tablet, TAKE 1 TABLET DAILY, Disp: 90 tablet, Rfl: 3   atorvastatin (LIPITOR) 20 MG tablet, Take 1 tablet (20 mg total) by mouth at bedtime., Disp: 90 tablet, Rfl: 1   metoprolol succinate (TOPROL-XL) 100 MG 24 hr tablet, TAKE 1 TABLET DAILY WITH ORIMMEDIATELY FOLLOWING A    MEAL, Disp: 90 tablet, Rfl: 1  No Known Allergies  I personally reviewed active problem list, medication list, allergies, health maintenance, notes from last encounter, lab results with the patient/caregiver today.   Review of Systems  Constitutional:  Negative for chills, diaphoresis and fever.  HENT:  Positive for congestion, sinus pain and sore throat.   Respiratory:  Positive for cough. Negative for shortness of breath and wheezing.   Musculoskeletal:  Negative for myalgias.       Objective  Vitals:   06/10/23 0844 06/10/23 0913  BP: (!) 168/100 (!) 162/90  Pulse: 88   Resp: 16   Temp: 98.1 F (36.7 C)   TempSrc: Oral   SpO2: 98%   Weight: 169 lb 8 oz (76.9 kg)   Height: 5\' 4"  (1.626 m)     Body mass index is 29.09 kg/m.  Physical Exam Vitals reviewed.  Constitutional:      General: She is awake.     Appearance: Normal appearance. She is well-developed and well-groomed.  HENT:     Head: Normocephalic and atraumatic.     Right Ear: Tympanic membrane, ear canal and external ear normal.     Left Ear: Tympanic membrane, ear canal and external ear normal.     Nose: Rhinorrhea present. Rhinorrhea is clear.     Mouth/Throat:     Mouth: Mucous membranes are moist.     Pharynx: Oropharynx  is clear. Uvula midline. No oropharyngeal exudate, posterior oropharyngeal erythema or postnasal drip.  Eyes:     Extraocular Movements: Extraocular movements intact.     Conjunctiva/sclera: Conjunctivae normal.     Pupils: Pupils are equal, round, and reactive to light.  Cardiovascular:     Rate and Rhythm: Normal rate and regular rhythm.     Pulses: Normal pulses.     Heart sounds: Normal heart sounds. No murmur heard.    No friction rub. No gallop.  Pulmonary:     Effort: Pulmonary effort is normal. No respiratory distress.     Breath sounds: Normal breath sounds. No decreased air movement. No decreased breath sounds, wheezing, rhonchi or rales.  Musculoskeletal:     Cervical back: Normal range of motion and neck supple.  Lymphadenopathy:     Head:     Right side of head: No submental, submandibular or preauricular adenopathy.     Left side of head: No submental, submandibular or preauricular adenopathy.     Cervical:     Right cervical: No superficial cervical adenopathy.    Left cervical: No superficial cervical adenopathy.     Upper Body:     Right upper body: No supraclavicular adenopathy.     Left upper body: No supraclavicular adenopathy.   Neurological:     General: No focal deficit present.     Mental Status: She is alert and oriented to person, place, and time.     GCS: GCS eye subscore is 4. GCS verbal subscore is 5. GCS motor subscore is 6.     Cranial Nerves: No cranial nerve deficit, dysarthria or facial asymmetry.  Psychiatric:        Attention and Perception: Attention and perception normal.        Mood and Affect: Mood and affect normal.        Speech: Speech normal.        Behavior: Behavior normal. Behavior is cooperative.      Recent Results (from the past 2160 hour(s))  POCT Influenza A/B     Status: None   Collection Time: 06/10/23  9:54 AM  Result Value Ref Range   Influenza A, POC Negative Negative   Influenza B, POC Negative Negative     PHQ2/9:    06/10/2023    8:42 AM 03/09/2023   10:06 AM 08/28/2022    9:19 AM 08/15/2022    8:30 AM 07/10/2022    9:07 AM  Depression screen PHQ 2/9  Decreased Interest 0 0 0 0 0  Down, Depressed, Hopeless 0 0 0 0 0  PHQ - 2 Score 0 0 0 0 0  Altered sleeping 0 0 0 0 0  Tired, decreased energy 0 0 0 0 0  Change in appetite 0 0 0 0 0  Feeling bad or failure about yourself  0 0 0 0 0  Trouble concentrating 0 0 0 0 0  Moving slowly or fidgety/restless 0 0 0 0 0  Suicidal thoughts 0 0 0 0 0  PHQ-9 Score 0 0 0 0 0  Difficult doing work/chores Not difficult at all Not difficult at all  Not difficult at all Not difficult at all      Fall Risk:    06/10/2023    8:42 AM 03/09/2023   10:06 AM 08/28/2022    9:19 AM 08/15/2022    8:30 AM 07/10/2022    9:06 AM  Fall Risk   Falls in the past year? 0 0 0 0  0  Number falls in past yr: 0 0 0 0 0  Injury with Fall? 0 0 0 0 0  Risk for fall due to : No Fall Risks No Fall Risks No Fall Risks No Fall Risks No Fall Risks  Follow up Falls prevention discussed;Education provided;Falls evaluation completed Falls prevention discussed;Education provided;Falls evaluation completed Falls prevention discussed Falls prevention  discussed;Education provided;Falls evaluation completed Falls prevention discussed;Education provided;Falls evaluation completed      Functional Status Survey: Is the patient deaf or have difficulty hearing?: No Does the patient have difficulty seeing, even when wearing glasses/contacts?: No Does the patient have difficulty concentrating, remembering, or making decisions?: No Does the patient have difficulty walking or climbing stairs?: No Does the patient have difficulty dressing or bathing?: No Does the patient have difficulty doing errands alone such as visiting a doctor's office or shopping?: No    Assessment & Plan  Problem List Items Addressed This Visit       Cardiovascular and Mediastinum   Hypertension    Chronic, historic condition Patient reports that she has not taken her combo medication for several weeks as she got her medication bottles mixed up.  She has restarted this a few days ago. She is currently prescribed amlodipine-valsartan 5-160 mg p.o. daily, metoprolol 100 mg p.o. daily Recommend that she continues current regimen for now. Recommend that she measures blood pressure at home and keep a log of records to review at follow-up appointment If still elevated at next appointment might need to adjust or add medications Follow-up in 3 months or sooner if concerns arise      Relevant Medications   atorvastatin (LIPITOR) 20 MG tablet   metoprolol succinate (TOPROL-XL) 100 MG 24 hr tablet     Endocrine   Diabetes (HCC) - Primary    Chronic, historic condition Patient appears to be managing diabetes with diet and exercise, lifestyle.  Most recent A1c was 6.8. Recheck today Results to dictate further management Reviewed importance of annual diabetes eye exam She is currently on statin medication as well as ARB therapy. Continue current regimen, following reduced carbohydrate and sugar intake.  Follow-up in 3 months or sooner if concerns arise      Relevant  Medications   atorvastatin (LIPITOR) 20 MG tablet     Other   HLD (hyperlipidemia)    chronic, historic condition Patient appears to be doing well on current regimen comprised of atorvastatin 20 mg p.o. daily. Continue current regimen Recheck lipid panel today Results to dictate further management Follow-up in 3 months or sooner if concerns arise      Relevant Medications   atorvastatin (LIPITOR) 20 MG tablet   metoprolol succinate (TOPROL-XL) 100 MG 24 hr tablet   Other Visit Diagnoses     Upper respiratory tract infection, unspecified type    Visit with patient indicates symptoms comprised of sinus pressure, nasal congestion, dry cough, headaches for 3 days  congruent with acute URI that is likely viral in nature  Flu and COVID testing completed in office today Flu A and B were negative, awaiting COVID results Due to nature and duration of symptoms recommended treatment regimen is symptomatic relief and follow up if needed Discussed with patient the various viral and bacterial etiologies of current illness and appropriate course of treatment Discussed OTC medication options for multisymptom relief such as Mucinex, Robitussin, Flonase, antihistamine, Tylenol  Discussed return precautions if symptoms are not improving or worsen over next 5-7 days.      Relevant  Orders   POCT Influenza A/B (Completed)   Novel Coronavirus, NAA ([facility])        Return in about 3 months (around 09/10/2023) for DM, HTN.   I, Latina Frank E Jovontae Banko, PA-C, have reviewed all documentation for this visit. The documentation on 06/10/23 for the exam, diagnosis, procedures, and orders are all accurate and complete.   Jacquelin Hawking, MHS, PA-C [facility]

## 2023-06-10 NOTE — Assessment & Plan Note (Signed)
Chronic, historic condition Patient reports that she has not taken her combo medication for several weeks as she got her medication bottles mixed up.  She has restarted this a few days ago. She is currently prescribed amlodipine-valsartan 5-160 mg p.o. daily, metoprolol 100 mg p.o. daily Recommend that she continues current regimen for now. Recommend that she measures blood pressure at home and keep a log of records to review at follow-up appointment If still elevated at next appointment might need to adjust or add medications Follow-up in 3 months or sooner if concerns arise

## 2023-06-10 NOTE — Progress Notes (Signed)
Your flu testing was negative for flu A and flu B  We are still waiting on the results of your COVID testing and we will keep you updated once its available

## 2023-06-10 NOTE — Assessment & Plan Note (Signed)
chronic, historic condition Patient appears to be doing well on current regimen comprised of atorvastatin 20 mg p.o. daily. Continue current regimen Recheck lipid panel today Results to dictate further management Follow-up in 3 months or sooner if concerns arise

## 2023-06-10 NOTE — Assessment & Plan Note (Signed)
Chronic, historic condition Patient appears to be managing diabetes with diet and exercise, lifestyle.  Most recent A1c was 6.8. Recheck today Results to dictate further management Reviewed importance of annual diabetes eye exam She is currently on statin medication as well as ARB therapy. Continue current regimen, following reduced carbohydrate and sugar intake.  Follow-up in 3 months or sooner if concerns arise

## 2023-06-11 LAB — NOVEL CORONAVIRUS, NAA: SARS-CoV-2, NAA: NOT DETECTED

## 2023-06-11 LAB — SPECIMEN STATUS REPORT

## 2023-06-11 NOTE — Progress Notes (Signed)
Your COVID testing was negative

## 2023-06-14 ENCOUNTER — Encounter: Payer: Self-pay | Admitting: Physician Assistant

## 2023-06-14 DIAGNOSIS — J019 Acute sinusitis, unspecified: Secondary | ICD-10-CM

## 2023-06-16 MED ORDER — AMOXICILLIN-POT CLAVULANATE 875-125 MG PO TABS
1.0000 | ORAL_TABLET | Freq: Two times a day (BID) | ORAL | 0 refills | Status: DC
Start: 2023-06-16 — End: 2023-11-18

## 2023-06-17 ENCOUNTER — Other Ambulatory Visit: Payer: Self-pay | Admitting: Family Medicine

## 2023-06-24 ENCOUNTER — Telehealth: Payer: BC Managed Care – PPO

## 2023-06-24 DIAGNOSIS — R3989 Other symptoms and signs involving the genitourinary system: Secondary | ICD-10-CM

## 2023-06-25 NOTE — Progress Notes (Signed)
Because you are currently finishing an antibiotic for sinusitis but still developed a likely UTI that is most probably resistant to certain antibiotics, I feel your condition warrants further evaluation and I recommend that you be seen in a face to face visit.   NOTE: There will be NO CHARGE for this eVisit   If you are having a true medical emergency please call 911.      For an urgent face to face visit, [facility] has eight urgent care centers for your convenience:   NEW!! [facility] Health Urgent Care Center at [facility] Get Driving Directions [phone] [address] [place], [address]    [facility] Health Urgent Care Center at [facility] Get Driving Directions [phone] [address] [place], [place] [address]   [facility] Health Urgent Care Center [facility]) Get Driving Directions [phone] [address] [place], [place] [address]  [facility] Health Urgent Care Center [facility]) Get Driving Directions [phone] [address] [place],  [place]  [address]  [facility] Health Urgent Care Center [facility] - at [employer]  [phone] [address] [place],  [place] [address]   [facility] Health Urgent Care at [facility] Get Driving Directions [phone] [address] [place], [place] [address]   [facility] Health Urgent Care at [facility] Get Driving Directions  [phone] [address] [place], [place] [address]   [facility] Health Urgent Care at [facility] Directions [phone] [address] [place], [place] [address]  Your MyChart E-visit questionnaire answers were reviewed by a board certified advanced clinical practitioner to complete your personal care plan based on your specific symptoms.  Thank you for using e-Visits.

## 2023-07-06 ENCOUNTER — Encounter: Payer: Self-pay | Admitting: Family Medicine

## 2023-07-07 ENCOUNTER — Ambulatory Visit: Payer: BC Managed Care – PPO | Admitting: Physician Assistant

## 2023-07-07 ENCOUNTER — Other Ambulatory Visit (HOSPITAL_COMMUNITY)
Admission: RE | Admit: 2023-07-07 | Discharge: 2023-07-07 | Disposition: A | Discharge status: Home / Self Care | Payer: BC Managed Care – PPO | Source: Ambulatory Visit | Attending: Physician Assistant | Admitting: Physician Assistant

## 2023-07-07 VITALS — BP 150/84 | HR 91 | Resp 16 | Ht 64.0 in | Wt 170.5 lb

## 2023-07-07 DIAGNOSIS — N390 Urinary tract infection, site not specified: Secondary | ICD-10-CM

## 2023-07-07 DIAGNOSIS — R300 Dysuria: Secondary | ICD-10-CM | POA: Diagnosis not present

## 2023-07-07 DIAGNOSIS — R319 Hematuria, unspecified: Secondary | ICD-10-CM | POA: Diagnosis not present

## 2023-07-07 LAB — POCT URINALYSIS DIPSTICK
Bilirubin, UA: NEGATIVE
Glucose, UA: NEGATIVE
Ketones, UA: NEGATIVE
Nitrite, UA: NEGATIVE
Protein, UA: NEGATIVE
Spec Grav, UA: 1.01 (ref 1.010–1.025)
Urobilinogen, UA: 0.2 U/dL
pH, UA: 6 (ref 5.0–8.0)

## 2023-07-07 MED ORDER — PHENAZOPYRIDINE HCL 100 MG PO TABS
100.0000 mg | ORAL_TABLET | Freq: Three times a day (TID) | ORAL | 0 refills | Status: AC | PRN
Start: 2023-07-07 — End: ?

## 2023-07-07 NOTE — Progress Notes (Signed)
Acute Office Visit   Patient: Carrie Cross   DOB: 1976-03-06   47 y.o. Female  MRN: [id number] Visit Date: 07/07/2023  Today's healthcare provider: Oswaldo Conroy Samanvitha Germany, PA-C  Introduced myself to the patient as a Secondary school teacher and provided education on APPs in clinical practice.    Chief Complaint  Patient presents with   Dysuria   Urinary Frequency    Onset for a week and half   Subjective    HPI HPI     Urinary Frequency    Additional comments: Onset for a week and half      Last edited by Forde Radon, CMA on 07/07/2023  2:51 PM.      Concern for UTI  Onset: sudden  Duration: pressure started about 2 days ago but she initially started to have symptoms around 06/24/23  Associated Symptoms: discomfort with urination- pressure sensation, urinary hesitancy, increased frequency Interventions: Tried taking Augmentin   She reports she did not take the Augmentin when she was prescribed it for her sinus infection  She did try taking it for her UTI symptoms when she was denies abx on 06/24/23 - reports it did provide some relief but she has since developed some pressure and   Medications: Outpatient Medications Prior to Visit  Medication Sig   amLODipine-valsartan (EXFORGE) 5-160 MG tablet Take 1 tablet by mouth daily.   atorvastatin (LIPITOR) 20 MG tablet Take 1 tablet (20 mg total) by mouth at bedtime.   Baclofen 5 MG TABS Take 1 tablet (5 mg total) by mouth 3 (three) times daily as needed (muscle stiffness, pain or spasms). Sedating - do not take and drive or operate heavy machinery   busPIRone (BUSPAR) 5 MG tablet Take 1-3 tablets (5-15 mg total) by mouth 2 (two) times daily as needed.   clobetasol ointment (TEMOVATE) 0.05 % Apply 1 application topically 2 (two) times daily. Apply to affected area   fluticasone (FLONASE) 50 MCG/ACT nasal spray USE 2 SPRAYS IN EACH       NOSTRIL DAILY.   hydrOXYzine (VISTARIL) 25 MG capsule Take 1 capsule (25 mg total) by  mouth daily as needed for anxiety.   levonorgestrel (MIRENA) 20 MCG/24HR IUD by Intrauterine route. Reported on 12/13/2015   metoprolol succinate (TOPROL-XL) 100 MG 24 hr tablet TAKE 1 TABLET DAILY WITH ORIMMEDIATELY FOLLOWING A    MEAL   Multiple Vitamin (MULTIVITAMIN) capsule Take 1 capsule by mouth daily.   omeprazole (PRILOSEC) 20 MG capsule TAKE 1 CAPSULE DAILY BEFOREBREAKFAST   valACYclovir (VALTREX) 500 MG tablet TAKE 1 TABLET DAILY   amoxicillin-clavulanate (AUGMENTIN) 875-125 MG tablet Take 1 tablet by mouth 2 (two) times daily.   No facility-administered medications prior to visit.    Review of Systems  Constitutional:  Negative for chills and fever.  Genitourinary:  Positive for dysuria, flank pain and frequency. Negative for difficulty urinating, hematuria, urgency, vaginal bleeding, vaginal discharge and vaginal pain.  Neurological:  Negative for dizziness, light-headedness and headaches.        Objective    BP (!) 150/84   Pulse 91   Resp 16   Ht 5\' 4"  (1.626 m)   Wt 170 lb 8 oz (77.3 kg)   SpO2 98%   BMI 29.27 kg/m     Physical Exam Vitals reviewed.  Constitutional:      General: She is awake.     Appearance: Normal appearance. She is well-developed and well-groomed.  HENT:  Head: Normocephalic and atraumatic.  Eyes:     General: Lids are normal. Gaze aligned appropriately.     Extraocular Movements: Extraocular movements intact.  Pulmonary:     Effort: Pulmonary effort is normal.  Musculoskeletal:     Cervical back: Normal range of motion.  Neurological:     General: No focal deficit present.     Mental Status: She is alert and oriented to person, place, and time. Mental status is at baseline.  Psychiatric:        Mood and Affect: Mood normal.        Behavior: Behavior normal. Behavior is cooperative.        Thought Content: Thought content normal.        Judgment: Judgment normal.       Results for orders placed or performed in visit on  07/07/23  POCT urinalysis dipstick  Result Value Ref Range   Color, UA yellow    Clarity, UA cloudy    Glucose, UA Negative Negative   Bilirubin, UA Negative    Ketones, UA Negative    Spec Grav, UA 1.010 1.010 - 1.025   Blood, UA Moderate    pH, UA 6.0 5.0 - 8.0   Protein, UA Negative Negative   Urobilinogen, UA 0.2 0.2 or 1.0 E.U./dL   Nitrite, UA Negative    Leukocytes, UA Large (3+) (A) Negative   Appearance hazy    Odor foul     Assessment & Plan      No follow-ups on file.      Problem List Items Addressed This Visit   None Visit Diagnoses     Urinary tract infection with hematuria, site unspecified    -  Primary   Relevant Medications   phenazopyridine (PYRIDIUM) 100 MG tablet   Dysuria       Relevant Medications   phenazopyridine (PYRIDIUM) 100 MG tablet   Other Relevant Orders   POCT urinalysis dipstick (Completed)   Urine Culture   Cervicovaginal ancillary only      Acute, new concern Patient reports discomfort with urination, bladder pressure, sensation of urinary hesitancy, increased urinary frequency and urgency since 06/24/2023. She reports seeing virtual visit around this time but She was instead encouraged to seek an office appointment. After virtual visit she started taking her Augmentin that was prescribed for upper respiratory infection.  She reports that she finished this and had some mild symptom relief but symptoms have since returned Given failure of Augmentin to provide full resolution, will send urine culture off and wait for results before providing further ABX intervention For now  will provide Pyridium for assistance with discomfort. Results of urine dip reviewed with her during appointment.  We will also collect cervicovaginal swab for further evaluation Reviewed ED and return precautions Follow-up as needed for progressing or persistent symptoms  No follow-ups on file.   I, Anyi Fels E Kyler Lerette, PA-C, have reviewed all documentation for  this visit. The documentation on 07/07/23 for the exam, diagnosis, procedures, and orders are all accurate and complete.   Jacquelin Hawking, MHS, PA-C [facility]

## 2023-07-09 ENCOUNTER — Encounter: Payer: Self-pay | Admitting: Physician Assistant

## 2023-07-09 LAB — CERVICOVAGINAL ANCILLARY ONLY
Bacterial Vaginitis (gardnerella): NEGATIVE
Candida Glabrata: NEGATIVE
Candida Vaginitis: NEGATIVE
Comment: NEGATIVE
Comment: NEGATIVE
Comment: NEGATIVE
Comment: NEGATIVE
Trichomonas: NEGATIVE

## 2023-07-09 LAB — URINE CULTURE
MICRO NUMBER:: 15688688
SPECIMEN QUALITY:: ADEQUATE

## 2023-07-10 MED ORDER — SULFAMETHOXAZOLE-TRIMETHOPRIM 800-160 MG PO TABS
1.0000 | ORAL_TABLET | Freq: Two times a day (BID) | ORAL | 0 refills | Status: AC
Start: 2023-07-10 — End: 2023-07-17

## 2023-07-10 NOTE — Addendum Note (Signed)
Addended by: Jacquelin Hawking on: 07/10/2023 03:06 PM   Modules accepted: Orders

## 2023-07-14 ENCOUNTER — Encounter: Payer: Self-pay | Admitting: Family Medicine

## 2023-07-14 NOTE — Telephone Encounter (Signed)
Care team updated and letter sent for eye exam notes.

## 2023-08-17 ENCOUNTER — Other Ambulatory Visit: Payer: Self-pay | Admitting: Physician Assistant

## 2023-08-17 DIAGNOSIS — Z1231 Encounter for screening mammogram for malignant neoplasm of breast: Secondary | ICD-10-CM

## 2023-09-03 ENCOUNTER — Ambulatory Visit
Admission: RE | Admit: 2023-09-03 | Discharge: 2023-09-03 | Disposition: A | Discharge status: Home / Self Care | Payer: 59 | Source: Ambulatory Visit | Attending: Physician Assistant | Admitting: Physician Assistant

## 2023-09-03 DIAGNOSIS — Z1231 Encounter for screening mammogram for malignant neoplasm of breast: Secondary | ICD-10-CM | POA: Diagnosis present

## 2023-09-08 ENCOUNTER — Other Ambulatory Visit: Payer: Self-pay | Admitting: Family Medicine

## 2023-09-08 NOTE — Progress Notes (Signed)
Mammogram did not find evidence of malignancy Recommended repeat screening mammogram in one year.

## 2023-09-10 ENCOUNTER — Ambulatory Visit: Payer: BC Managed Care – PPO | Admitting: Nurse Practitioner

## 2023-10-01 ENCOUNTER — Telehealth: Payer: 59 | Admitting: Family Medicine

## 2023-10-01 DIAGNOSIS — J069 Acute upper respiratory infection, unspecified: Secondary | ICD-10-CM

## 2023-10-01 MED ORDER — IPRATROPIUM BROMIDE 0.03 % NA SOLN: 2.0000 | Freq: Two times a day (BID) | NASAL | 0 refills | Status: AC

## 2023-10-01 MED ORDER — BENZONATATE 100 MG PO CAPS: 100.0000 mg | ORAL_CAPSULE | Freq: Three times a day (TID) | ORAL | 0 refills | Status: DC | PRN

## 2023-10-01 NOTE — Progress Notes (Signed)

## 2023-10-19 ENCOUNTER — Other Ambulatory Visit: Payer: Self-pay | Admitting: Family Medicine

## 2023-10-19 DIAGNOSIS — I10 Essential (primary) hypertension: Secondary | ICD-10-CM

## 2023-10-20 MED ORDER — AMLODIPINE BESYLATE-VALSARTAN 5-160 MG PO TABS: 1.0000 | ORAL_TABLET | Freq: Every day | ORAL | 0 refills | Status: DC

## 2023-10-20 NOTE — Telephone Encounter (Signed)
 Last f/u 06/2023

## 2023-10-20 NOTE — Telephone Encounter (Signed)
 Lvm for pt to return call to schedule appt in the next 1-2 month. Pt also informed prescription has been sent to pharmacy

## 2023-11-18 ENCOUNTER — Encounter: Payer: Self-pay | Admitting: Family Medicine

## 2023-11-18 ENCOUNTER — Ambulatory Visit: Admitting: Family Medicine

## 2023-11-18 VITALS — BP 138/78 | HR 86 | Resp 16 | Ht 64.0 in | Wt 168.0 lb

## 2023-11-18 DIAGNOSIS — Z5181 Encounter for therapeutic drug level monitoring: Secondary | ICD-10-CM

## 2023-11-18 DIAGNOSIS — E782 Mixed hyperlipidemia: Secondary | ICD-10-CM

## 2023-11-18 DIAGNOSIS — E1169 Type 2 diabetes mellitus with other specified complication: Secondary | ICD-10-CM

## 2023-11-18 DIAGNOSIS — I10 Essential (primary) hypertension: Secondary | ICD-10-CM

## 2023-11-18 DIAGNOSIS — K219 Gastro-esophageal reflux disease without esophagitis: Secondary | ICD-10-CM | POA: Diagnosis not present

## 2023-11-18 DIAGNOSIS — E119 Type 2 diabetes mellitus without complications: Secondary | ICD-10-CM

## 2023-11-18 MED ORDER — METOPROLOL SUCCINATE ER 100 MG PO TB24: ORAL_TABLET | ORAL | 1 refills | Status: DC

## 2023-11-18 MED ORDER — AMLODIPINE BESYLATE-VALSARTAN 5-320 MG PO TABS: 1.0000 | ORAL_TABLET | Freq: Every day | ORAL | 0 refills | Status: DC

## 2023-11-18 MED ORDER — OMEPRAZOLE 20 MG PO CPDR
20.0000 mg | DELAYED_RELEASE_CAPSULE | Freq: Every day | ORAL | 3 refills | Status: AC
Start: 2023-11-18 — End: ?

## 2023-11-18 NOTE — Progress Notes (Signed)
 Name: Carrie Cross   MRN: [id number]    DOB: 1976-06-04   Date:11/18/2023       Progress Note  Chief Complaint  Patient presents with   Medical Management of Chronic Issues     Subjective:   Carrie Cross is a 48 y.o. female, presents to clinic for routine follow up on chronic conditions  On lipitor Lab Results  Component Value Date   CHOL 151 03/09/2023   HDL 67 03/09/2023   LDLCALC 71 03/09/2023   TRIG 52 03/09/2023   CHOLHDL 2.3 03/09/2023   DM:   Not on meds Recent pertinent labs: Lab Results  Component Value Date   HGBA1C 6.8 (H) 03/09/2023   HGBA1C 6.6 (H) 08/28/2022   HGBA1C 6.1 (H) 02/13/2022   Lab Results  Component Value Date   MICROALBUR 4.8 03/09/2023   LDLCALC 71 03/09/2023   CREATININE 0.61 03/09/2023   Standard of care and health maintenance: Urine Microalbumin:  due Foot exam:  due/done today DM eye exam:  due ACEI/ARB:  yes Statin:  yes  HTN, bp has not been at goal High innitially today and slightly improved with recheck on amlodipine-valsartan BP Readings from Last 3 Encounters:  11/18/23 138/78  07/07/23 (!) 150/84  06/10/23 (!) 162/90       Current Outpatient Medications:    amLODipine-valsartan (EXFORGE) 5-160 MG tablet, Take 1 tablet by mouth daily., Disp: 90 tablet, Rfl: 0   atorvastatin (LIPITOR) 20 MG tablet, Take 1 tablet (20 mg total) by mouth at bedtime., Disp: 90 tablet, Rfl: 1   Baclofen 5 MG TABS, Take 1 tablet (5 mg total) by mouth 3 (three) times daily as needed (muscle stiffness, pain or spasms). Sedating - do not take and drive or operate heavy machinery, Disp: 90 tablet, Rfl: 2   busPIRone (BUSPAR) 5 MG tablet, Take 1-3 tablets (5-15 mg total) by mouth 2 (two) times daily as needed., Disp: 30 tablet, Rfl: 0   clobetasol ointment (TEMOVATE) 0.05 %, Apply 1 application topically 2 (two) times daily. Apply to affected area, Disp: 30 g, Rfl: 2   fluticasone (FLONASE) 50 MCG/ACT nasal spray, USE 2 SPRAYS IN  EACH       NOSTRIL DAILY., Disp: 48 g, Rfl: 0   hydrOXYzine (VISTARIL) 25 MG capsule, Take 1 capsule (25 mg total) by mouth daily as needed for anxiety., Disp: 15 capsule, Rfl: 0   ipratropium (ATROVENT) 0.03 % nasal spray, Place 2 sprays into both nostrils every 12 (twelve) hours., Disp: 30 mL, Rfl: 0   levonorgestrel (MIRENA) 20 MCG/24HR IUD, by Intrauterine route. Reported on 12/13/2015, Disp: , Rfl:    metoprolol succinate (TOPROL-XL) 100 MG 24 hr tablet, TAKE 1 TABLET DAILY WITH ORIMMEDIATELY FOLLOWING A    MEAL, Disp: 90 tablet, Rfl: 1   Multiple Vitamin (MULTIVITAMIN) capsule, Take 1 capsule by mouth daily., Disp: , Rfl:    omeprazole (PRILOSEC) 20 MG capsule, TAKE 1 CAPSULE DAILY BEFOREBREAKFAST, Disp: 90 capsule, Rfl: 3   phenazopyridine (PYRIDIUM) 100 MG tablet, Take 1 tablet (100 mg total) by mouth 3 (three) times daily as needed for pain., Disp: 10 tablet, Rfl: 0   valACYclovir (VALTREX) 500 MG tablet, TAKE 1 TABLET DAILY, Disp: 90 tablet, Rfl: 3  Patient Active Problem List   Diagnosis Date Noted   Gastric erythema 07/17/2022   Chronic GERD 07/17/2022   Other cervical disc displacement, unspecified cervical region 07/10/2022   Colon cancer screening    Adenomatous polyp of sigmoid colon  Inflammatory arthritis 05/02/2021   Diabetes (HCC) 05/02/2021   Muscle cramps 02/18/2020   Tingling 02/18/2020   IUD (intrauterine device) in place 01/27/2020   Insomnia 11/18/2019   Herpes simplex 08/10/2015   Eczema 05/11/2015   Hypertension    HLD (hyperlipidemia)    GERD (gastroesophageal reflux disease)    Allergic rhinitis     Past Surgical History:  Procedure Laterality Date   BACK SURGERY  [id number]   disc shaved down   CERVICAL DISC ARTHROPLASTY  [id number]   COLONOSCOPY WITH PROPOFOL N/A 10/25/2021   Procedure: COLONOSCOPY WITH PROPOFOL;  Surgeon: Toney Reil, MD;  Location: [facility] END[facility]COPY;  Service: Gastroenterology;  Laterality: N/A;   ESOPHAGOGASTRODUODENOSCOPY  (EGD) WITH PROPOFOL N/A 07/17/2022   Procedure: ESOPHAGOGASTRODUODENOSCOPY (EGD) WITH BIOPSY;  Surgeon: Toney Reil, MD;  Location: [facility];  Service: Endoscopy;  Laterality: N/A;  leave patient second    Family History  Problem Relation Age of Onset   Breast cancer Maternal Grandmother 80   Heart disease Maternal Grandfather    Stroke Neg Hx    Diabetes Neg Hx    Ovarian cancer Neg Hx    Colon cancer Neg Hx     Social History   Tobacco Use   Smoking status: Former    Current packs/day: 0.00    Average packs/day: 0.5 packs/day for 7.0 years (3.5 ttl pk-yrs)    Types: Cigarettes    Start date: 06/01/2004    Quit date: 06/02/2011    Years since quitting: 12.4   Smokeless tobacco: Never  Vaping Use   Vaping status: Never Used  Substance Use Topics   Alcohol use: Yes    Alcohol/week: 3.0 standard drinks of alcohol    Types: 3 Glasses of wine per week   Drug use: No     No Known Allergies  Health Maintenance  Topic Date Due   OPHTHALMOLOGY EXAM  Never done   COVID-19 Vaccine (6 - 2024-25 season) 05/03/2023   FOOT EXAM  08/29/2023   HEMOGLOBIN A1C  09/09/2023   Pneumococcal Vaccine 68-24 Years old (1 of 2 - PCV) 11/17/2024 (Originally 08/03/1982)   Diabetic kidney evaluation - eGFR measurement  03/08/2024   Diabetic kidney evaluation - Urine ACR  03/08/2024   MAMMOGRAM  09/02/2024   Colonoscopy  10/25/2024   Cervical Cancer Screening (HPV/Pap Cotest)  01/31/2026   DTaP/Tdap/Td (3 - Td or Tdap) 08/07/2030   INFLUENZA VACCINE  Completed   Hepatitis C Screening  Completed   HIV Screening  Addressed   HPV VACCINES  Aged Out    Chart Review Today: I personally reviewed active problem list, medication list, allergies, family history, social history, health maintenance, notes from last encounter, lab results, imaging with the patient/caregiver today.   Review of Systems  Constitutional: Negative.   HENT: Negative.    Eyes: Negative.   Respiratory:  Negative.    Cardiovascular: Negative.   Gastrointestinal: Negative.   Endocrine: Negative.   Genitourinary: Negative.   Musculoskeletal: Negative.   Skin: Negative.   Allergic/Immunologic: Negative.   Neurological: Negative.   Hematological: Negative.   Psychiatric/Behavioral: Negative.    All other systems reviewed and are negative.    Objective:   Vitals:   11/18/23 0813 11/18/23 0825  BP: (!) 150/86 (!) 146/82  Pulse: 86   Resp: 16   SpO2: 98%   Weight: 168 lb (76.2 kg)   Height: 5\' 4"  (1.626 m)     Body mass index is 28.84 kg/m.  Physical  Exam Vitals and nursing note reviewed.  Constitutional:      General: She is not in acute distress.    Appearance: Normal appearance. She is well-developed. She is not ill-appearing, toxic-appearing or diaphoretic.  HENT:     Head: Normocephalic and atraumatic.     Nose: Nose normal.  Eyes:     General:        Right eye: No discharge.        Left eye: No discharge.     Conjunctiva/sclera: Conjunctivae normal.  Neck:     Trachea: No tracheal deviation.  Cardiovascular:     Rate and Rhythm: Normal rate and regular rhythm.     Pulses: Normal pulses.     Heart sounds: Normal heart sounds.     No friction rub. No gallop.  Pulmonary:     Effort: Pulmonary effort is normal. No respiratory distress.     Breath sounds: Normal breath sounds. No stridor. No wheezing, rhonchi or rales.  Skin:    General: Skin is warm and dry.     Findings: No rash.  Neurological:     Mental Status: She is alert.     Motor: No abnormal muscle tone.     Coordination: Coordination normal.  Psychiatric:        Mood and Affect: Mood normal.        Behavior: Behavior normal.    Diabetic Foot Exam - Simple   No data filed       Functional Status Survey:   Results for orders placed or performed in visit on 07/07/23  POCT urinalysis dipstick   Collection Time: 07/07/23  2:22 PM  Result Value Ref Range   Color, UA yellow    Clarity, UA  cloudy    Glucose, UA Negative Negative   Bilirubin, UA Negative    Ketones, UA Negative    Spec Grav, UA 1.010 1.010 - 1.025   Blood, UA Moderate    pH, UA 6.0 5.0 - 8.0   Protein, UA Negative Negative   Urobilinogen, UA 0.2 0.2 or 1.0 E.U./dL   Nitrite, UA Negative    Leukocytes, UA Large (3+) (A) Negative   Appearance hazy    Odor foul   Urine Culture   Collection Time: 07/07/23  3:10 PM   Specimen: Urine  Result Value Ref Range   MICRO NUMBER: [id number]    SPECIMEN QUALITY: Adequate    Sample Source URINE    STATUS: FINAL    ISOLATE 1: Enterobacter cloacae complex (A)       Susceptibility   Enterobacter cloacae complex - URINE CULTURE, REFLEX    AMOX/CLAVULANIC >=32 Resistant     CEFAZOLIN* >=64 Resistant      * For uncomplicated UTI caused by E. coli, K. pneumoniae or P. mirabilis: Cefazolin is susceptible if MIC <32 mcg/mL and predicts susceptible to the oral agents cefaclor, cefdinir, cefpodoxime, cefprozil, cefuroxime, cephalexin and loracarbef.     CEFTAZIDIME <=1 Sensitive     CEFEPIME <=1 Sensitive     CEFTRIAXONE <=1 Sensitive     CIPROFLOXACIN <=0.25 Sensitive     LEVOFLOXACIN <=0.12 Sensitive     GENTAMICIN <=1 Sensitive     IMIPENEM <=0.25 Sensitive     NITROFURANTOIN 64 Intermediate     PIP/TAZO 8 Sensitive     TOBRAMYCIN <=1 Sensitive     TRIMETH/SULFA* <=20 Sensitive      * For uncomplicated UTI caused by E. coli, K. pneumoniae or P. mirabilis: Cefazolin is susceptible if  MIC <32 mcg/mL and predicts susceptible to the oral agents cefaclor, cefdinir, cefpodoxime, cefprozil, cefuroxime, cephalexin and loracarbef. Legend: S = Susceptible  I = Intermediate R = Resistant  NS = Not susceptible SDD = Susceptible Dose Dependent * = Not Tested  NR = Not Reported **NN = See Therapy Comments   Cervicovaginal ancillary only   Collection Time: 07/07/23  3:20 PM  Result Value Ref Range   Trichomonas Negative    Bacterial Vaginitis (gardnerella) Negative     Candida Vaginitis Negative    Candida Glabrata Negative    Comment      Normal Reference Range Bacterial Vaginosis - Negative   Comment Normal Reference Range Candida Species - Negative    Comment Normal Reference Range Candida Galbrata - Negative    Comment Normal Reference Range Trichomonas - Negative       Assessment & Plan:   Type 2 diabetes mellitus without complication, without long-term current use of insulin (HCC) Assessment & Plan: Not on meds, due for labs, dm foot exam done today - normal Due for DM eye exam (or req records and update chart)  Orders: -     Hemoglobin A1c -     HM Diabetes Foot Exam -     COMPLETE METABOLIC PANEL WITHOUT GFR -     Microalbumin / creatinine urine ratio  Primary hypertension Assessment & Plan: Has been been controlled for the past couple visits and elevated here today With 3rd recheck slightly better but still not at goal BP Readings from Last 3 Encounters:  11/18/23 138/78  07/07/23 (!) 150/84  06/10/23 (!) 162/90  Dose change for amlodipine-valsartan Close f/up for BP recheck DASH also advised   Orders: -     amLODIPine Besylate-Valsartan; Take 1 tablet by mouth daily.  Dispense: 30 tablet; Refill: 0 -     Metoprolol Succinate ER; TAKE 1 TABLET DAILY WITH ORIMMEDIATELY FOLLOWING A    MEAL  Dispense: 90 tablet; Refill: 1  Mixed hyperlipidemia Assessment & Plan: Compliant with meds, no SE, no myalgias, fatigue or jaundice Labs reviewed Lab Results  Component Value Date   CHOL 163 11/18/2023   HDL 65 11/18/2023   LDLCALC 84 11/18/2023   TRIG 62 11/18/2023   CHOLHDL 2.5 11/18/2023   Continue meds, diet and lifestyle efforts   Orders: -     COMPLETE METABOLIC PANEL WITHOUT GFR -     Lipid panel  Gastroesophageal reflux disease, unspecified whether esophagitis present Assessment & Plan: Sx managed with omeprazole Take breaks if able Can try pepcid OTC BID PRN Would need GI consult if not well controlled or  unable to wean off  Orders: -     Omeprazole; Take 1 capsule (20 mg total) by mouth daily.  Dispense: 90 capsule; Refill: 3  Encounter for medication monitoring -     CBC with Differential/Platelet -     COMPLETE METABOLIC PANEL WITHOUT GFR -     Lipid panel -     Microalbumin / creatinine urine ratio  Gastroesophageal reflux disease without esophagitis Assessment & Plan: Sx managed with omeprazole Take breaks if able Can try pepcid OTC BID PRN Would need GI consult if not well controlled or unable to wean off  Orders: -     COMPLETE METABOLIC PANEL WITHOUT GFR     Return for BP recheck 3-4 week appt .   Danelle Berry, PA-C 11/18/23 8:39 AM

## 2023-11-19 LAB — CBC WITH DIFFERENTIAL/PLATELET
Absolute Lymphocytes: 2182 {cells}/uL (ref 850–3900)
Absolute Monocytes: 680 {cells}/uL (ref 200–950)
Basophils Absolute: 22 {cells}/uL (ref 0–200)
Basophils Relative: 0.2 %
Eosinophils Absolute: 65 {cells}/uL (ref 15–500)
Eosinophils Relative: 0.6 %
HCT: 38.3 % (ref 35.0–45.0)
Hemoglobin: 12.9 g/dL (ref 11.7–15.5)
MCH: 31 pg (ref 27.0–33.0)
MCHC: 33.7 g/dL (ref 32.0–36.0)
MCV: 92.1 fL (ref 80.0–100.0)
MPV: 9.1 fL (ref 7.5–12.5)
Monocytes Relative: 6.3 %
Neutro Abs: 7852 {cells}/uL — ABNORMAL HIGH (ref 1500–7800)
Neutrophils Relative %: 72.7 %
Platelets: 392 10*3/uL (ref 140–400)
RBC: 4.16 10*6/uL (ref 3.80–5.10)
RDW: 12.6 % (ref 11.0–15.0)
Total Lymphocyte: 20.2 %
WBC: 10.8 10*3/uL (ref 3.8–10.8)

## 2023-11-19 LAB — LIPID PANEL
Cholesterol: 163 mg/dL (ref <200)
HDL: 65 mg/dL (ref >=50)
LDL Cholesterol (Calc): 84 mg/dL
Non-HDL Cholesterol (Calc): 98 mg/dL (ref <130)
Total CHOL/HDL Ratio: 2.5 (calc) (ref <5.0)
Triglycerides: 62 mg/dL (ref <150)

## 2023-11-19 LAB — COMPLETE METABOLIC PANEL WITH GFR
AG Ratio: 2.2 (calc) (ref 1.0–2.5)
ALT: 21 U/L (ref 6–29)
AST: 14 U/L (ref 10–35)
Albumin: 4.8 g/dL (ref 3.6–5.1)
Alkaline phosphatase (APISO): 81 U/L (ref 31–125)
BUN: 14 mg/dL (ref 7–25)
CO2: 27 mmol/L (ref 20–32)
Calcium: 9.8 mg/dL (ref 8.6–10.2)
Chloride: 104 mmol/L (ref 98–110)
Creat: 0.64 mg/dL (ref 0.50–0.99)
Globulin: 2.2 g/dL (ref 1.9–3.7)
Glucose, Bld: 125 mg/dL — ABNORMAL HIGH (ref 65–99)
Potassium: 4.7 mmol/L (ref 3.5–5.3)
Sodium: 139 mmol/L (ref 135–146)
Total Bilirubin: 0.3 mg/dL (ref 0.2–1.2)
Total Protein: 7 g/dL (ref 6.1–8.1)

## 2023-11-19 LAB — MICROALBUMIN / CREATININE URINE RATIO
Creatinine, Urine: 110 mg/dL (ref 20–275)
Microalb Creat Ratio: 35 mg/g{creat} — ABNORMAL HIGH (ref <30)
Microalb, Ur: 3.8 mg/dL

## 2023-11-19 LAB — HEMOGLOBIN A1C
Hgb A1c MFr Bld: 7 %{Hb} — ABNORMAL HIGH (ref <5.7)
Mean Plasma Glucose: 154 mg/dL
eAG (mmol/L): 8.5 mmol/L

## 2023-11-23 ENCOUNTER — Encounter: Payer: Self-pay | Admitting: Family Medicine

## 2023-11-26 NOTE — Assessment & Plan Note (Signed)
 Compliant with meds, no SE, no myalgias, fatigue or jaundice Labs reviewed Lab Results  Component Value Date   CHOL 163 11/18/2023   HDL 65 11/18/2023   LDLCALC 84 11/18/2023   TRIG 62 11/18/2023   CHOLHDL 2.5 11/18/2023   Continue meds, diet and lifestyle efforts

## 2023-11-26 NOTE — Assessment & Plan Note (Signed)
 Has been been controlled for the past couple visits and elevated here today With 3rd recheck slightly better but still not at goal BP Readings from Last 3 Encounters:  11/18/23 138/78  07/07/23 (!) 150/84  06/10/23 (!) 162/90  Dose change for amlodipine-valsartan Close f/up for BP recheck DASH also advised

## 2023-11-26 NOTE — Assessment & Plan Note (Signed)
 Sx managed with omeprazole Take breaks if able Can try pepcid OTC BID PRN Would need GI consult if not well controlled or unable to wean off

## 2023-11-26 NOTE — Assessment & Plan Note (Signed)
 Not on meds, due for labs, dm foot exam done today - normal Due for DM eye exam (or req records and update chart)

## 2023-12-06 ENCOUNTER — Other Ambulatory Visit: Payer: Self-pay | Admitting: Family Medicine

## 2023-12-08 NOTE — Telephone Encounter (Signed)
 Requested Prescriptions  Pending Prescriptions Disp Refills   fluticasone (FLONASE) 50 MCG/ACT nasal spray [Pharmacy Med Name: FLUTICASONE  [facility]] 48 g 0    Sig: USE 2 SPRAYS IN EACH       NOSTRIL DAILY.     Ear, Nose, and Throat: Nasal Preparations - Corticosteroids Failed - 12/08/2023 10:22 AM      Failed - Valid encounter within last 12 months    Recent Outpatient Visits           2 weeks ago Type 2 diabetes mellitus without complication, without long-term current use of insulin [facility])   [facility] Danelle Berry, PA-C       Future Appointments             In 1 week Danelle Berry, PA-C [facility]             valACYclovir (VALTREX) 500 MG tablet [Pharmacy Med Name: VALACYCLOVIR TAB 500MG ] 90 tablet 3    Sig: TAKE 1 TABLET DAILY     Antimicrobials:  Antiviral Agents - Anti-Herpetic Failed - 12/08/2023 10:22 AM      Failed - Valid encounter within last 12 months    Recent Outpatient Visits           2 weeks ago Type 2 diabetes mellitus without complication, without long-term current use of insulin [facility])   [facility] Danelle Berry, PA-C       Future Appointments             In 1 week Danelle Berry, PA-C [facility], [facility]

## 2023-12-16 ENCOUNTER — Encounter: Payer: Self-pay | Admitting: Family Medicine

## 2023-12-16 ENCOUNTER — Ambulatory Visit: Admitting: Family Medicine

## 2023-12-16 DIAGNOSIS — I10 Essential (primary) hypertension: Secondary | ICD-10-CM | POA: Diagnosis not present

## 2023-12-16 MED ORDER — AMLODIPINE BESYLATE-VALSARTAN 5-320 MG PO TABS: 1.0000 | ORAL_TABLET | Freq: Every day | ORAL | 0 refills | Status: DC

## 2023-12-16 NOTE — Patient Instructions (Signed)
   Blood Pressure Record Sheet  Blood pressure log Date: _______________________ a.m. _____________________(1st reading) _____________________(2nd reading) p.m. _____________________(1st reading) _____________________(2nd reading) Date: _______________________ a.m. _____________________(1st reading) _____________________(2nd reading) p.m. _____________________(1st reading) _____________________(2nd reading) Date: _______________________ a.m. _____________________(1st reading) _____________________(2nd reading) p.m. _____________________(1st reading) _____________________(2nd reading) Date: _______________________ a.m. _____________________(1st reading) _____________________(2nd reading) p.m. _____________________(1st reading) _____________________(2nd reading) Date: _______________________ a.m. _____________________(1st reading) _____________________(2nd reading) p.m. _____________________(1st reading) _____________________(2nd reading) Date: _______________________ a.m. _____________________(1st reading) _____________________(2nd reading) p.m. _____________________(1st reading) _____________________(2nd reading) Date: _______________________ a.m. _____________________(1st reading) _____________________(2nd reading) p.m. _____________________(1st reading) _____________________(2nd reading) Date: _______________________ a.m. _____________________(1st reading) _____________________(2nd reading) p.m. _____________________(1st reading) _____________________(2nd reading) Date: _______________________ a.m. _____________________(1st reading) _____________________(2nd reading) p.m. _____________________(1st reading) _____________________(2nd reading) Date: _______________________ a.m. _____________________(1st reading) _____________________(2nd reading) p.m. _____________________(1st reading) _____________________(2nd reading)

## 2023-12-16 NOTE — Progress Notes (Signed)
 Name: Carrie Cross   MRN: [id number]    DOB: 06-29-1976   Date:12/16/2023       Progress Note  Chief Complaint  Patient presents with   Follow-up    Blood pressure     Subjective:   Carrie Cross is a 48 y.o. female, presents to clinic for routine follow up on chronic conditions  BP meds increased and she's here for bp f/up She is tolerating the medications but unfortunately her blood pressure is still elevated She is still not checking at home BP Readings from Last 3 Encounters:  12/16/23 (!) 154/84  11/18/23 138/78  07/07/23 (!) 150/84  Pt denies CP, SOB, exertional sx, LE edema, palpitation, Ha's, visual disturbances, lightheadedness, hypotension, syncope. Dietary efforts for BP?  yes    Current Outpatient Medications:    amLODipine -valsartan  (EXFORGE ) 5-320 MG tablet, Take 1 tablet by mouth daily., Disp: 30 tablet, Rfl: 0   atorvastatin  (LIPITOR) 20 MG tablet, Take 1 tablet (20 mg total) by mouth at bedtime., Disp: 90 tablet, Rfl: 1   Baclofen  5 MG TABS, Take 1 tablet (5 mg total) by mouth 3 (three) times daily as needed (muscle stiffness, pain or spasms). Sedating - do not take and drive or operate heavy machinery, Disp: 90 tablet, Rfl: 2   busPIRone  (BUSPAR ) 5 MG tablet, Take 1-3 tablets (5-15 mg total) by mouth 2 (two) times daily as needed., Disp: 30 tablet, Rfl: 0   clobetasol  ointment (TEMOVATE ) 0.05 %, Apply 1 application topically 2 (two) times daily. Apply to affected area, Disp: 30 g, Rfl: 2   fluticasone  (FLONASE ) 50 MCG/ACT nasal spray, USE 2 SPRAYS IN EACH       NOSTRIL DAILY., Disp: 48 g, Rfl: 0   hydrOXYzine  (VISTARIL ) 25 MG capsule, Take 1 capsule (25 mg total) by mouth daily as needed for anxiety., Disp: 15 capsule, Rfl: 0   ipratropium (ATROVENT ) 0.03 % nasal spray, Place 2 sprays into both nostrils every 12 (twelve) hours., Disp: 30 mL, Rfl: 0   levonorgestrel  (MIRENA ) 20 MCG/24HR IUD, by Intrauterine route. Reported on 12/13/2015, Disp: , Rfl:     metoprolol  succinate (TOPROL -XL) 100 MG 24 hr tablet, TAKE 1 TABLET DAILY WITH ORIMMEDIATELY FOLLOWING A    MEAL, Disp: 90 tablet, Rfl: 1   Multiple Vitamin (MULTIVITAMIN) capsule, Take 1 capsule by mouth daily., Disp: , Rfl:    omeprazole  (PRILOSEC) 20 MG capsule, Take 1 capsule (20 mg total) by mouth daily., Disp: 90 capsule, Rfl: 3   phenazopyridine  (PYRIDIUM ) 100 MG tablet, Take 1 tablet (100 mg total) by mouth 3 (three) times daily as needed for pain., Disp: 10 tablet, Rfl: 0   valACYclovir  (VALTREX ) 500 MG tablet, TAKE 1 TABLET DAILY, Disp: 90 tablet, Rfl: 0  Patient Active Problem List   Diagnosis Date Noted   Gastric erythema 07/17/2022   Chronic GERD 07/17/2022   Other cervical disc displacement, unspecified cervical region 07/10/2022   Colon cancer screening    Adenomatous polyp of sigmoid colon    Inflammatory arthritis 05/02/2021   Diabetes (HCC) 05/02/2021   Muscle cramps 02/18/2020   Tingling 02/18/2020   IUD (intrauterine device) in place 01/27/2020   Insomnia 11/18/2019   Herpes simplex 08/10/2015   Eczema 05/11/2015   Hypertension    HLD (hyperlipidemia)    GERD (gastroesophageal reflux disease)    Allergic rhinitis     Past Surgical History:  Procedure Laterality Date   BACK SURGERY  [id number]   disc shaved down  CERVICAL DISC ARTHROPLASTY  [id number]   COLONOSCOPY WITH PROPOFOL  N/A 10/25/2021   Procedure: COLONOSCOPY WITH PROPOFOL ;  Surgeon: Selena Daily, MD;  [facility] ENDOSCOPY;  Service: Gastroenterology;  Laterality: N/A;   ESOPHAGOGASTRODUODENOSCOPY (EGD) WITH PROPOFOL  N/A 07/17/2022   Procedure: ESOPHAGOGASTRODUODENOSCOPY (EGD) WITH BIOPSY;  Surgeon: Selena Daily, MD;  Location: [facility];  Service: Endoscopy;  Laterality: N/A;  leave patient second    Family History  Problem Relation Age of Onset   Breast cancer Maternal Grandmother 80   Heart disease Maternal Grandfather    Stroke Neg Hx    Diabetes Neg Hx     Ovarian cancer Neg Hx    Colon cancer Neg Hx     Social History   Tobacco Use   Smoking status: Former    Current packs/day: 0.00    Average packs/day: 0.5 packs/day for 7.0 years (3.5 ttl pk-yrs)    Types: Cigarettes    Start date: 06/01/2004    Quit date: 06/02/2011    Years since quitting: 12.5   Smokeless tobacco: Never  Vaping Use   Vaping status: Never Used  Substance Use Topics   Alcohol use: Yes    Alcohol/week: 3.0 standard drinks of alcohol    Types: 3 Glasses of wine per week   Drug use: No     No Known Allergies  Health Maintenance  Topic Date Due   OPHTHALMOLOGY EXAM  Never done   COVID-19 Vaccine (6 - 2024-25 season) 05/03/2023   Pneumococcal Vaccine 17-74 Years old (1 of 2 - PCV) 11/17/2024 (Originally 08/04/1995)   INFLUENZA VACCINE  04/01/2024   HEMOGLOBIN A1C  05/20/2024   MAMMOGRAM  09/02/2024   Colonoscopy  10/25/2024   Diabetic kidney evaluation - eGFR measurement  11/17/2024   Diabetic kidney evaluation - Urine ACR  11/17/2024   FOOT EXAM  11/17/2024   Cervical Cancer Screening (HPV/Pap Cotest)  01/31/2026   DTaP/Tdap/Td (3 - Td or Tdap) 08/07/2030   Hepatitis C Screening  Completed   HIV Screening  Addressed   HPV VACCINES  Aged Out   Meningococcal B Vaccine  Aged Out    Chart Review Today: I personally reviewed active problem list, medication list, allergies, family history, social history, health maintenance, notes from last encounter, lab results, imaging with the patient/caregiver today.   Review of Systems  Constitutional: Negative.   HENT: Negative.    Eyes: Negative.   Respiratory: Negative.    Cardiovascular: Negative.   Gastrointestinal: Negative.   Endocrine: Negative.   Genitourinary: Negative.   Musculoskeletal: Negative.   Skin: Negative.   Allergic/Immunologic: Negative.   Neurological: Negative.   Hematological: Negative.   Psychiatric/Behavioral: Negative.    All other systems reviewed and are negative.     Objective:   Vitals:   12/16/23 1047 12/16/23 1103  BP: (!) 162/84 (!) 154/84  Pulse: 78   Resp: 16   SpO2: 97%   Weight: 166 lb (75.3 kg)   Height: 5\' 4"  (1.626 m)     Body mass index is 28.49 kg/m.  Physical Exam Vitals and nursing note reviewed.  Constitutional:      Appearance: She is well-developed.  HENT:     Head: Normocephalic and atraumatic.     Nose: Nose normal.  Eyes:     General:        Right eye: No discharge.        Left eye: No discharge.     Conjunctiva/sclera: Conjunctivae normal.  Neck:  Trachea: No tracheal deviation.  Cardiovascular:     Rate and Rhythm: Normal rate and regular rhythm.  Pulmonary:     Effort: Pulmonary effort is normal. No respiratory distress.     Breath sounds: No stridor.  Musculoskeletal:        General: Normal range of motion.  Skin:    General: Skin is warm and dry.     Findings: No rash.  Neurological:     Mental Status: She is alert.     Motor: No abnormal muscle tone.     Coordination: Coordination normal.  Psychiatric:        Behavior: Behavior normal.        Results for orders placed or performed in visit on 11/18/23  HgB A1c   Collection Time: 11/18/23  9:07 AM  Result Value Ref Range   Hgb A1c MFr Bld 7.0 (H) <5.7 % of total Hgb   Mean Plasma Glucose 154 mg/dL   eAG (mmol/L) 8.5 mmol/L  CBC with Differential/Platelet   Collection Time: 11/18/23  9:07 AM  Result Value Ref Range   WBC 10.8 3.8 - 10.8 Thousand/uL   RBC 4.16 3.80 - 5.10 Million/uL   Hemoglobin 12.9 11.7 - 15.5 g/dL   HCT 16.1 09.6 - 04.5 %   MCV 92.1 80.0 - 100.0 fL   MCH 31.0 27.0 - 33.0 pg   MCHC 33.7 32.0 - 36.0 g/dL   RDW 40.9 81.1 - 91.4 %   Platelets 392 140 - 400 Thousand/uL   MPV 9.1 7.5 - 12.5 fL   Neutro Abs 7,852 (H) 1,500 - 7,800 cells/uL   Absolute Lymphocytes 2,182 850 - 3,900 cells/uL   Absolute Monocytes 680 200 - 950 cells/uL   Eosinophils Absolute 65 15 - 500 cells/uL   Basophils Absolute 22 0 - 200  cells/uL   Neutrophils Relative % 72.7 %   Total Lymphocyte 20.2 %   Monocytes Relative 6.3 %   Eosinophils Relative 0.6 %   Basophils Relative 0.2 %  COMPLETE METABOLIC PANEL WITH GFR   Collection Time: 11/18/23  9:07 AM  Result Value Ref Range   Glucose, Bld 125 (H) 65 - 99 mg/dL   BUN 14 7 - 25 mg/dL   Creat 7.82 9.56 - 2.13 mg/dL   BUN/Creatinine Ratio SEE NOTE: 6 - 22 (calc)   Sodium 139 135 - 146 mmol/L   Potassium 4.7 3.5 - 5.3 mmol/L   Chloride 104 98 - 110 mmol/L   CO2 27 20 - 32 mmol/L   Calcium  9.8 8.6 - 10.2 mg/dL   Total Protein 7.0 6.1 - 8.1 g/dL   Albumin 4.8 3.6 - 5.1 g/dL   Globulin 2.2 1.9 - 3.7 g/dL (calc)   AG Ratio 2.2 1.0 - 2.5 (calc)   Total Bilirubin 0.3 0.2 - 1.2 mg/dL   Alkaline phosphatase (APISO) 81 31 - 125 U/L   AST 14 10 - 35 U/L   ALT 21 6 - 29 U/L  Lipid panel   Collection Time: 11/18/23  9:07 AM  Result Value Ref Range   Cholesterol 163 <200 mg/dL   HDL 65 > OR = 50 mg/dL   Triglycerides 62 <086 mg/dL   LDL Cholesterol (Calc) 84 mg/dL (calc)   Total CHOL/HDL Ratio 2.5 <5.0 (calc)   Non-HDL Cholesterol (Calc) 98 <578 mg/dL (calc)  Microalbumin / creatinine urine ratio   Collection Time: 11/18/23  9:07 AM  Result Value Ref Range   Creatinine, Urine 110 20 - 275 mg/dL  Microalb, Ur 3.8 mg/dL   Microalb Creat Ratio 35 (H) <30 mg/g creat      Assessment & Plan:   Primary hypertension -     amLODIPine  Besylate-Valsartan ; Take 1 tablet by mouth daily.  Dispense: 90 tablet; Refill: 0  Blood pressure medication was increased but unfortunately her blood pressure is still not at goal and remains elevated despite rechecking several times in the office today.  She has only been on the higher dose for a short time Recommend she continue the same dose amlodipine  valsartan  5-320 mg daily and keep working on diet/lifestyle efforts and do some BP readings at home to see if she may be having some whitecoat hypertension  Follow-up in 2  weeks    Adeline Hone, PA-C 12/16/23 11:05 AM

## 2023-12-30 ENCOUNTER — Encounter: Payer: Self-pay | Admitting: Family Medicine

## 2024-01-12 ENCOUNTER — Other Ambulatory Visit (HOSPITAL_COMMUNITY): Payer: Self-pay

## 2024-01-17 ENCOUNTER — Encounter: Payer: Self-pay | Admitting: Family Medicine

## 2024-01-18 MED ORDER — CLOBETASOL PROPIONATE 0.05 % EX OINT: 1.0000 | TOPICAL_OINTMENT | Freq: Two times a day (BID) | CUTANEOUS | 2 refills | Status: AC

## 2024-02-15 ENCOUNTER — Encounter: Payer: Self-pay | Admitting: Family Medicine

## 2024-02-15 ENCOUNTER — Ambulatory Visit: Admitting: Family Medicine

## 2024-02-15 VITALS — BP 136/76 | HR 92 | Resp 16 | Ht 64.0 in | Wt 171.0 lb

## 2024-02-15 DIAGNOSIS — I10 Essential (primary) hypertension: Secondary | ICD-10-CM | POA: Diagnosis not present

## 2024-02-15 DIAGNOSIS — E119 Type 2 diabetes mellitus without complications: Secondary | ICD-10-CM | POA: Diagnosis not present

## 2024-02-15 MED ORDER — AMLODIPINE BESYLATE-VALSARTAN 5-320 MG PO TABS: 1.0000 | ORAL_TABLET | Freq: Every day | ORAL | 1 refills | Status: DC

## 2024-02-15 NOTE — Patient Instructions (Addendum)
 Repeat your labs after 02/18/2024 for the A1c and be ready to do a urine as well You can come any time we're open M-F 8-11:30 and 1:30-3:30  Tell the front office you're there for labs and they will get you in line for labs with [facility]

## 2024-02-15 NOTE — Assessment & Plan Note (Signed)
 BP closer to goal today BP Readings from Last 3 Encounters:  02/15/24 136/76  12/16/23 (!) 152/82  11/18/23 138/78  Home readings showed SBP was 150's but today near goal, will keep her on same dose exforge  5-320, if BP goes any higher we can try to increase to 10-320 She was encouraged to continue DASH as well as med

## 2024-02-15 NOTE — Assessment & Plan Note (Signed)
 Not on meds, A1c gradually increased with mild + UACR Recheck A1c and urine after 6/19 and then pending results pt will work more on diet efforts, she can consult with diabetes educator/RD and +/- meds She wishes to avoid taking more medications, so we was encouraged to work on diet efforts Lab Results  Component Value Date   HGBA1C 7.0 (H) 11/18/2023   HGBA1C 6.8 (H) 03/09/2023   HGBA1C 6.6 (H) 08/28/2022   HGBA1C 6.1 (H) 02/13/2022   HGBA1C 5.9 (H) 05/02/2021

## 2024-02-15 NOTE — Progress Notes (Signed)
 Name: Carrie Cross   MRN: [id number]    DOB: 1976/08/04   Date:02/15/2024       Progress Note  Chief Complaint  Patient presents with   Hypertension   Diabetes     Subjective:   Carrie Cross is a 48 y.o. female, presents to clinic for routine follow up on chronic conditions   Here for BP f/up, we increased her med dose about 2 months ago, her BP continued to be high in clinic so she was to monitor at home BP readings (see if they are falsely elevated in clinic) On exforge  5-320 for more than 2 months BP readings at home that she shows me on her phone are 150's/70-80's  T2DM numbers gradually increasing and pt is not on meds Last were roughtly 3 months ago She has not made any diet efforts but is interested in rechecking labs Lab Results  Component Value Date   HGBA1C 7.0 (H) 11/18/2023       Current Outpatient Medications:    amLODipine -valsartan  (EXFORGE ) 5-320 MG tablet, Take 1 tablet by mouth daily., Disp: 90 tablet, Rfl: 0   atorvastatin  (LIPITOR) 20 MG tablet, Take 1 tablet (20 mg total) by mouth at bedtime., Disp: 90 tablet, Rfl: 1   Baclofen  5 MG TABS, Take 1 tablet (5 mg total) by mouth 3 (three) times daily as needed (muscle stiffness, pain or spasms). Sedating - do not take and drive or operate heavy machinery, Disp: 90 tablet, Rfl: 2   busPIRone  (BUSPAR ) 5 MG tablet, Take 1-3 tablets (5-15 mg total) by mouth 2 (two) times daily as needed., Disp: 30 tablet, Rfl: 0   clobetasol  ointment (TEMOVATE ) 0.05 %, Apply 1 Application topically 2 (two) times daily. Apply to affected area, Disp: 30 g, Rfl: 2   fluticasone  (FLONASE ) 50 MCG/ACT nasal spray, USE 2 SPRAYS IN EACH       NOSTRIL DAILY., Disp: 48 g, Rfl: 0   hydrOXYzine  (VISTARIL ) 25 MG capsule, Take 1 capsule (25 mg total) by mouth daily as needed for anxiety., Disp: 15 capsule, Rfl: 0   ipratropium (ATROVENT ) 0.03 % nasal spray, Place 2 sprays into both nostrils every 12 (twelve) hours., Disp: 30 mL,  Rfl: 0   levonorgestrel  (MIRENA ) 20 MCG/24HR IUD, by Intrauterine route. Reported on 12/13/2015, Disp: , Rfl:    metoprolol  succinate (TOPROL -XL) 100 MG 24 hr tablet, TAKE 1 TABLET DAILY WITH ORIMMEDIATELY FOLLOWING A    MEAL, Disp: 90 tablet, Rfl: 1   Multiple Vitamin (MULTIVITAMIN) capsule, Take 1 capsule by mouth daily., Disp: , Rfl:    omeprazole  (PRILOSEC) 20 MG capsule, Take 1 capsule (20 mg total) by mouth daily., Disp: 90 capsule, Rfl: 3   phenazopyridine  (PYRIDIUM ) 100 MG tablet, Take 1 tablet (100 mg total) by mouth 3 (three) times daily as needed for pain., Disp: 10 tablet, Rfl: 0   valACYclovir  (VALTREX ) 500 MG tablet, TAKE 1 TABLET DAILY, Disp: 90 tablet, Rfl: 0  Patient Active Problem List   Diagnosis Date Noted   Gastric erythema 07/17/2022   Chronic GERD 07/17/2022   Other cervical disc displacement, unspecified cervical region 07/10/2022   Colon cancer screening    Adenomatous polyp of sigmoid colon    Inflammatory arthritis 05/02/2021   Diabetes (HCC) 05/02/2021   Muscle cramps 02/18/2020   Tingling 02/18/2020   IUD (intrauterine device) in place 01/27/2020   Insomnia 11/18/2019   Herpes simplex 08/10/2015   Eczema 05/11/2015   Hypertension    HLD (hyperlipidemia)  GERD (gastroesophageal reflux disease)    Allergic rhinitis     Past Surgical History:  Procedure Laterality Date   BACK SURGERY  [id number]   disc shaved down   CERVICAL DISC ARTHROPLASTY  [id number]   COLONOSCOPY WITH PROPOFOL  N/A 10/25/2021   Procedure: COLONOSCOPY WITH PROPOFOL ;  Surgeon: Selena Daily, MD;  Location: [facility] ENDOSCOPY;  Service: Gastroenterology;  Laterality: N/A;   ESOPHAGOGASTRODUODENOSCOPY (EGD) WITH PROPOFOL  N/A 07/17/2022   Procedure: ESOPHAGOGASTRODUODENOSCOPY (EGD) WITH BIOPSY;  Surgeon: Selena Daily, MD;  Location: [facility];  Service: Endoscopy;  Laterality: N/A;  leave patient second    Family History  Problem Relation Age of Onset   Breast cancer  Maternal Grandmother 80   Heart disease Maternal Grandfather    Stroke Neg Hx    Diabetes Neg Hx    Ovarian cancer Neg Hx    Colon cancer Neg Hx     Social History   Tobacco Use   Smoking status: Former    Current packs/day: 0.00    Average packs/day: 0.5 packs/day for 7.0 years (3.5 ttl pk-yrs)    Types: Cigarettes    Start date: 06/01/2004    Quit date: 06/02/2011    Years since quitting: 12.7   Smokeless tobacco: Never  Vaping Use   Vaping status: Never Used  Substance Use Topics   Alcohol use: Yes    Alcohol/week: 3.0 standard drinks of alcohol    Types: 3 Glasses of wine per week   Drug use: No     No Known Allergies  Health Maintenance  Topic Date Due   OPHTHALMOLOGY EXAM  02/15/2024 (Originally [birth year]/1986)   COVID-19 Vaccine (6 - 2024-25 season) 03/02/2024 (Originally 05/03/2023)   Pneumococcal Vaccine 40-26 Years old (1 of 2 - PCV) 11/17/2024 (Originally 08/04/1995)   INFLUENZA VACCINE  04/01/2024   HEMOGLOBIN A1C  05/20/2024   MAMMOGRAM  09/02/2024   Colonoscopy  10/25/2024   Diabetic kidney evaluation - eGFR measurement  11/17/2024   Diabetic kidney evaluation - Urine ACR  11/17/2024   FOOT EXAM  11/17/2024   Cervical Cancer Screening (HPV/Pap Cotest)  01/31/2026   DTaP/Tdap/Td (3 - Td or Tdap) 08/07/2030   Hepatitis C Screening  Completed   HIV Screening  Addressed   HPV VACCINES  Aged Out   Meningococcal B Vaccine  Aged Out    Chart Review Today: I personally reviewed active problem list, medication list, allergies, family history, social history, health maintenance, notes from last encounter, lab results, imaging with the patient/caregiver today.   Review of Systems  Constitutional: Negative.   HENT: Negative.    Eyes: Negative.   Respiratory: Negative.    Cardiovascular: Negative.   Gastrointestinal: Negative.   Endocrine: Negative.   Genitourinary: Negative.   Musculoskeletal: Negative.   Skin: Negative.   Allergic/Immunologic: Negative.    Neurological: Negative.   Hematological: Negative.   Psychiatric/Behavioral: Negative.    All other systems reviewed and are negative.    Objective:   Vitals:   02/15/24 1520  BP: 136/76  Pulse: 92  Resp: 16  SpO2: 97%  Weight: 171 lb (77.6 kg)  Height: 5' 4 (1.626 m)    Body mass index is 29.35 kg/m.  Physical Exam Vitals and nursing note reviewed.  Constitutional:      General: She is not in acute distress.    Appearance: Normal appearance. She is well-developed. She is not ill-appearing, toxic-appearing or diaphoretic.  HENT:     Head: Normocephalic and atraumatic.  Right Ear: External ear normal.     Left Ear: External ear normal.     Nose: Nose normal.   Eyes:     General: No scleral icterus.       Right eye: No discharge.        Left eye: No discharge.     Conjunctiva/sclera: Conjunctivae normal.   Neck:     Trachea: No tracheal deviation.   Cardiovascular:     Rate and Rhythm: Normal rate and regular rhythm.     Pulses: Normal pulses.     Heart sounds: Normal heart sounds. No murmur heard.    No friction rub. No gallop.  Pulmonary:     Effort: Pulmonary effort is normal. No respiratory distress.     Breath sounds: No stridor.   Musculoskeletal:     Right lower leg: No edema.     Left lower leg: No edema.   Skin:    General: Skin is warm and dry.     Findings: No rash.   Neurological:     Mental Status: She is alert. Mental status is at baseline.     Motor: No abnormal muscle tone.     Coordination: Coordination normal.     Gait: Gait normal.   Psychiatric:        Mood and Affect: Mood normal.        Behavior: Behavior normal.      Functional Status Survey:   Results for orders placed or performed in visit on 11/18/23  HgB A1c   Collection Time: 11/18/23  9:07 AM  Result Value Ref Range   Hgb A1c MFr Bld 7.0 (H) <5.7 % of total Hgb   Mean Plasma Glucose 154 mg/dL   eAG (mmol/L) 8.5 mmol/L  CBC with Differential/Platelet    Collection Time: 11/18/23  9:07 AM  Result Value Ref Range   WBC 10.8 3.8 - 10.8 Thousand/uL   RBC 4.16 3.80 - 5.10 Million/uL   Hemoglobin 12.9 11.7 - 15.5 g/dL   HCT 40.9 81.1 - 91.4 %   MCV 92.1 80.0 - 100.0 fL   MCH 31.0 27.0 - 33.0 pg   MCHC 33.7 32.0 - 36.0 g/dL   RDW 78.2 95.6 - 21.3 %   Platelets 392 140 - 400 Thousand/uL   MPV 9.1 7.5 - 12.5 fL   Neutro Abs 7,852 (H) 1,500 - 7,800 cells/uL   Absolute Lymphocytes 2,182 850 - 3,900 cells/uL   Absolute Monocytes 680 200 - 950 cells/uL   Eosinophils Absolute 65 15 - 500 cells/uL   Basophils Absolute 22 0 - 200 cells/uL   Neutrophils Relative % 72.7 %   Total Lymphocyte 20.2 %   Monocytes Relative 6.3 %   Eosinophils Relative 0.6 %   Basophils Relative 0.2 %  COMPLETE METABOLIC PANEL WITH GFR   Collection Time: 11/18/23  9:07 AM  Result Value Ref Range   Glucose, Bld 125 (H) 65 - 99 mg/dL   BUN 14 7 - 25 mg/dL   Creat 0.86 5.78 - 4.69 mg/dL   BUN/Creatinine Ratio SEE NOTE: 6 - 22 (calc)   Sodium 139 135 - 146 mmol/L   Potassium 4.7 3.5 - 5.3 mmol/L   Chloride 104 98 - 110 mmol/L   CO2 27 20 - 32 mmol/L   Calcium  9.8 8.6 - 10.2 mg/dL   Total Protein 7.0 6.1 - 8.1 g/dL   Albumin 4.8 3.6 - 5.1 g/dL   Globulin 2.2 1.9 - 3.7 g/dL (calc)  AG Ratio 2.2 1.0 - 2.5 (calc)   Total Bilirubin 0.3 0.2 - 1.2 mg/dL   Alkaline phosphatase (APISO) 81 31 - 125 U/L   AST 14 10 - 35 U/L   ALT 21 6 - 29 U/L  Lipid panel   Collection Time: 11/18/23  9:07 AM  Result Value Ref Range   Cholesterol 163 <200 mg/dL   HDL 65 > OR = 50 mg/dL   Triglycerides 62 <161 mg/dL   LDL Cholesterol (Calc) 84 mg/dL (calc)   Total CHOL/HDL Ratio 2.5 <5.0 (calc)   Non-HDL Cholesterol (Calc) 98 <096 mg/dL (calc)  Microalbumin / creatinine urine ratio   Collection Time: 11/18/23  9:07 AM  Result Value Ref Range   Creatinine, Urine 110 20 - 275 mg/dL   Microalb, Ur 3.8 mg/dL   Microalb Creat Ratio 35 (H) <30 mg/g creat      Assessment & Plan:    Primary hypertension Assessment & Plan: BP closer to goal today BP Readings from Last 3 Encounters:  02/15/24 136/76  12/16/23 (!) 152/82  11/18/23 138/78  Home readings showed SBP was 150's but today near goal, will keep her on same dose exforge  5-320, if BP goes any higher we can try to increase to 10-320 She was encouraged to continue DASH as well as med   Orders: -     amLODIPine  Besylate-Valsartan ; Take 1 tablet by mouth daily.  Dispense: 90 tablet; Refill: 1  Type 2 diabetes mellitus without complication, without long-term current use of insulin (HCC) Assessment & Plan: Not on meds, A1c gradually increased with mild + UACR Recheck A1c and urine after 6/19 and then pending results pt will work more on diet efforts, she can consult with diabetes educator/RD and +/- meds She wishes to avoid taking more medications, so we was encouraged to work on diet efforts Lab Results  Component Value Date   HGBA1C 7.0 (H) 11/18/2023   HGBA1C 6.8 (H) 03/09/2023   HGBA1C 6.6 (H) 08/28/2022   HGBA1C 6.1 (H) 02/13/2022   HGBA1C 5.9 (H) 05/02/2021    Orders: -     Microalbumin / creatinine urine ratio -     Hemoglobin A1c   Meds - metformin she is hesitant about due to friends having GI SE, discussed GLP's and she's not interested in injections, with + UACR also discussed jardiance/farxiga which would tx DM and protect renal function    Return in about 6 months (around 08/16/2024) for Routine follow-up.   Adeline Hone, PA-C 02/15/24 3:46 PM

## 2024-02-22 LAB — HEMOGLOBIN A1C
Hgb A1c MFr Bld: 6.7 % — ABNORMAL HIGH (ref <5.7)
Mean Plasma Glucose: 146 mg/dL
eAG (mmol/L): 8.1 mmol/L

## 2024-02-23 ENCOUNTER — Ambulatory Visit: Payer: Self-pay | Admitting: Family Medicine

## 2024-02-23 LAB — MICROALBUMIN / CREATININE URINE RATIO
Creatinine, Urine: 168 mg/dL (ref 20–275)
Microalb Creat Ratio: 26 mg/g{creat} (ref <30)
Microalb, Ur: 4.4 mg/dL

## 2024-03-06 ENCOUNTER — Other Ambulatory Visit: Payer: Self-pay | Admitting: Family Medicine

## 2024-03-08 NOTE — Telephone Encounter (Signed)
 Requested Prescriptions  Pending Prescriptions Disp Refills   fluticasone  (FLONASE ) 50 MCG/ACT nasal spray [Pharmacy Med Name: FLUTICASONE   SPR RX] 48 g 0    Sig: USE 2 SPRAYS IN EACH       NOSTRIL DAILY.     Ear, Nose, and Throat: Nasal Preparations - Corticosteroids Passed - 03/08/2024  3:42 PM      Passed - Valid encounter within last 12 months    Recent Outpatient Visits           3 weeks ago Primary hypertension   [place] [facility] Leavy Mole, PA-C   2 months ago Primary hypertension   [place] [facility] Leavy Mole, PA-C   3 months ago Type 2 diabetes mellitus without complication, without long-term current use of insulin [facility])   [place] [facility] Leavy Mole, PA-C       Future Appointments             In 5 months Tapia, Leisa, PA-C [place] [facility], [facility]

## 2024-03-23 ENCOUNTER — Other Ambulatory Visit: Payer: Self-pay | Admitting: Family Medicine

## 2024-03-24 NOTE — Telephone Encounter (Signed)
 Requested Prescriptions  Pending Prescriptions Disp Refills   valACYclovir  (VALTREX ) 500 MG tablet [Pharmacy Med Name: VALACYCLOVIR  TAB 500MG ] 90 tablet 0    Sig: TAKE 1 TABLET DAILY     Antimicrobials:  Antiviral Agents - Anti-Herpetic Passed - 03/24/2024  4:09 PM      Passed - Valid encounter within last 12 months    Recent Outpatient Visits           1 month ago Primary hypertension   [place] [facility] Leavy Mole, PA-C   3 months ago Primary hypertension   [place] [facility] Leavy Mole, PA-C   4 months ago Type 2 diabetes mellitus without complication, without long-term current use of insulin [facility])   [place] [facility] Leavy Mole, PA-C       Future Appointments             In 4 months Tapia, Leisa, PA-C [place] [facility], [facility]

## 2024-04-11 ENCOUNTER — Telehealth: Admitting: Physician Assistant

## 2024-04-11 DIAGNOSIS — J019 Acute sinusitis, unspecified: Secondary | ICD-10-CM | POA: Diagnosis not present

## 2024-04-11 DIAGNOSIS — B9689 Other specified bacterial agents as the cause of diseases classified elsewhere: Secondary | ICD-10-CM | POA: Diagnosis not present

## 2024-04-11 MED ORDER — AMOXICILLIN-POT CLAVULANATE 875-125 MG PO TABS
1.0000 | ORAL_TABLET | Freq: Two times a day (BID) | ORAL | 0 refills | Status: AC
Start: 2024-04-11 — End: ?

## 2024-04-11 NOTE — Progress Notes (Signed)

## 2024-04-12 ENCOUNTER — Other Ambulatory Visit: Payer: Self-pay

## 2024-04-12 ENCOUNTER — Encounter: Payer: Self-pay | Admitting: Family Medicine

## 2024-04-12 DIAGNOSIS — E782 Mixed hyperlipidemia: Secondary | ICD-10-CM

## 2024-04-12 MED ORDER — ATORVASTATIN CALCIUM 20 MG PO TABS: 20.0000 mg | ORAL_TABLET | Freq: Every day | ORAL | 1 refills | Status: AC

## 2024-06-04 ENCOUNTER — Other Ambulatory Visit: Payer: Self-pay | Admitting: Family Medicine

## 2024-06-04 DIAGNOSIS — I10 Essential (primary) hypertension: Secondary | ICD-10-CM

## 2024-06-06 NOTE — Telephone Encounter (Signed)
 Requested Prescriptions  Pending Prescriptions Disp Refills   fluticasone  (FLONASE ) 50 MCG/ACT nasal spray [Pharmacy Med Name: FLUTICASONE   SPR RX] 48 g 0    Sig: USE 2 SPRAYS IN EACH       NOSTRIL DAILY.     Ear, Nose, and Throat: Nasal Preparations - Corticosteroids Passed - 06/06/2024  1:37 PM      Passed - Valid encounter within last 12 months    Recent Outpatient Visits           3 months ago Primary hypertension   [place] [facility] Leavy Mole, PA-C   5 months ago Primary hypertension   [place] [facility] Leavy Mole, PA-C   6 months ago Type 2 diabetes mellitus without complication, without long-term current use of insulin [facility])   [place] [facility] Leavy Mole, PA-C       Future Appointments             In 2 months Tapia, Leisa, PA-C [place] [facility] [place]             metoprolol  succinate (TOPROL -XL) 100 MG 24 hr tablet [Pharmacy Med Name: METOPROL SUC TAB 100MG  ER] 90 tablet 0    Sig: TAKE 1 TABLET DAILY WITH ORIMMEDIATELY FOLLOWING A    MEAL     Cardiovascular:  Beta Blockers Passed - 06/06/2024  1:37 PM      Passed - Last BP in normal range    BP Readings from Last 1 Encounters:  02/15/24 136/76         Passed - Last Heart Rate in normal range    Pulse Readings from Last 1 Encounters:  02/15/24 92         Passed - Valid encounter within last 6 months    Recent Outpatient Visits           3 months ago Primary hypertension   [place] [facility] Leavy Mole, PA-C   5 months ago Primary hypertension   [facility] Leavy Mole, [facility]-C   6 months ago Type 2 diabetes mellitus without complication, without long-term current use of insulin [facility])   [place] [facility] Leavy Mole, PA-C       Future Appointments             In 2 months Tapia, Leisa, PA-C [place] [facility]  [place]

## 2024-06-21 ENCOUNTER — Other Ambulatory Visit: Payer: Self-pay | Admitting: Family Medicine

## 2024-06-23 NOTE — Telephone Encounter (Signed)
 Requested Prescriptions  Pending Prescriptions Disp Refills   valACYclovir  (VALTREX ) 500 MG tablet [Pharmacy Med Name: VALACYCLOVIR  TAB 500MG ] 90 tablet 0    Sig: TAKE 1 TABLET DAILY     Antimicrobials:  Antiviral Agents - Anti-Herpetic Passed - 06/23/2024  9:41 AM      Passed - Valid encounter within last 12 months    Recent Outpatient Visits           4 months ago Primary hypertension   [place] [facility] Leavy Mole, PA-C   6 months ago Primary hypertension   [place] [facility] Leavy Mole, PA-C   7 months ago Type 2 diabetes mellitus without complication, without long-term current use of insulin [facility])    [facility] Leavy Mole, PA-C       Future Appointments             In 1 month Leavy Mole, PA-C  [facility] [place]

## 2024-08-16 ENCOUNTER — Ambulatory Visit: Admitting: Family Medicine

## 2024-09-02 ENCOUNTER — Telehealth: Payer: Self-pay | Admitting: Family Medicine

## 2024-09-02 NOTE — Telephone Encounter (Signed)
  metoprolol succinate (TOPROL-XL) 100 MG 24 hr tablet   

## 2024-09-02 NOTE — Telephone Encounter (Signed)
Appt sch'd with Dr Andrews 

## 2024-09-02 NOTE — Telephone Encounter (Signed)
 fluticasone  (FLONASE ) 50 MCG/ACT nasal spray

## 2024-09-23 ENCOUNTER — Other Ambulatory Visit: Payer: Self-pay | Admitting: Family Medicine

## 2024-09-23 DIAGNOSIS — I10 Essential (primary) hypertension: Secondary | ICD-10-CM

## 2024-09-23 MED ORDER — METOPROLOL SUCCINATE ER 100 MG PO TB24: ORAL_TABLET | ORAL | 0 refills | Status: AC

## 2024-09-23 MED ORDER — FLUTICASONE PROPIONATE 50 MCG/ACT NA SUSP: 2.0000 | Freq: Every day | NASAL | 0 refills | Status: AC

## 2024-09-23 NOTE — Telephone Encounter (Signed)
 Copied from CRM [id number]. Topic: Clinical - Medication Refill >> Sep 23, 2024  2:05 PM Nathanel BROCKS wrote: Medication: fluticasone  (FLONASE ) 50 MCG/ACT nasal spray metoprolol  succinate (TOPROL -XL) 100 MG 24 hr tablet    Has the patient contacted their pharmacy? Yes   This is the patient's preferred pharmacy:  [facility] [place], [place] - [address] [address] AT [facility] to [facility] [employer] [address] [place] [address] Phone: [phone] Fax: [phone]  Is this the correct pharmacy for this prescription? Yes If no, delete pharmacy and type the correct one.   Has the prescription been filled recently? Yes  Is the patient out of the medication? Yes  Has the patient been seen for an appointment in the last year OR does the patient have an upcoming appointment? Yes  Can we respond through MyChart? No  Agent: Please be advised that Rx refills may take up to 3 business days. We ask that you follow-up with your pharmacy.

## 2024-09-23 NOTE — Telephone Encounter (Signed)
 Patient has an upcoming appt with you, uses mail order ok to do 90 day supply or just sent 30 day supply local pharmacy?

## 2024-09-23 NOTE — Telephone Encounter (Signed)
 Requested medication (s) are due for refill today: yes  Requested medication (s) are on the active medication list: yes  Last refill:  06/06/24  Future visit scheduled: yes  Notes to clinic:  Unable to refill per protocol, last refill by another provider.      Requested Prescriptions  Pending Prescriptions Disp Refills   fluticasone  (FLONASE ) 50 MCG/ACT nasal spray 48 g 0    Sig: Place 2 sprays into both nostrils daily.     Ear, Nose, and Throat: Nasal Preparations - Corticosteroids Passed - 09/23/2024  2:52 PM      Passed - Valid encounter within last 12 months    Recent Outpatient Visits           7 months ago Primary hypertension   [place] [facility] Leavy Mole, PA-C   9 months ago Primary hypertension   [place] [facility] Leavy Mole, PA-C   10 months ago Type 2 diabetes mellitus without complication, without long-term current use of insulin [facility])   [place] [facility] Tapia, Leisa, PA-C               metoprolol  succinate (TOPROL -XL) 100 MG 24 hr tablet 90 tablet 0    Sig: TAKE 1 TABLET DAILY WITH ORIMMEDIATELY FOLLOWING A    MEAL     Cardiovascular:  Beta Blockers Failed - 09/23/2024  2:52 PM      Failed - Valid encounter within last 6 months    Recent Outpatient Visits           7 months ago Primary hypertension   [place] [facility] Leavy Mole, PA-C   9 months ago Primary hypertension   [place] [facility] Leavy Mole, PA-C   10 months ago Type 2 diabetes mellitus without complication, without long-term current use of insulin [facility])   [place] [facility] [place], Mole, PA-C              Passed - Last BP in normal range    BP Readings from Last 1 Encounters:  02/15/24 136/76         Passed - Last Heart Rate in normal range    Pulse Readings from Last 1 Encounters:  02/15/24 92

## 2024-10-04 ENCOUNTER — Other Ambulatory Visit: Payer: Self-pay | Admitting: Family Medicine

## 2024-10-04 DIAGNOSIS — I10 Essential (primary) hypertension: Secondary | ICD-10-CM

## 2024-10-04 NOTE — Telephone Encounter (Signed)
 Copied from CRM [id number]. Topic: Clinical - Medication Refill >> Oct 04, 2024  9:13 AM Travis F wrote: Medication: amLODipine -valsartan  (EXFORGE ) 5-320 MG tablet [[id number]]  Has the patient contacted their pharmacy? Yes  (Agent: If yes, when and what did the pharmacy advise?) pharmacy calling in request   This is the patient's preferred pharmacy:   [id number] [place],  - [address] AT [facility] [address] [employer] [address] [place] [address] Phone: [phone] Fax: [phone]  Is this the correct pharmacy for this prescription? Yes If no, delete pharmacy and type the correct one.   Has the prescription been filled recently? Yes  Is the patient out of the medication? Yes  Has the patient been seen for an appointment in the last year OR does the patient have an upcoming appointment? Yes  Can we respond through MyChart? Yes  Agent: Please be advised that Rx refills may take up to 3 business days. We ask that you follow-up with your pharmacy.

## 2024-10-05 MED ORDER — AMLODIPINE BESYLATE-VALSARTAN 5-320 MG PO TABS: 1.0000 | ORAL_TABLET | Freq: Every day | ORAL | 0 refills | Status: AC

## 2024-10-05 NOTE — Telephone Encounter (Signed)
 Requested Prescriptions  Pending Prescriptions Disp Refills   amLODipine -valsartan  (EXFORGE ) 5-320 MG tablet 90 tablet 0    Sig: Take 1 tablet by mouth daily.     Cardiovascular: CCB + ARB Combos Failed - 10/05/2024  2:48 PM      Failed - K in normal range and within 180 days    Potassium  Date Value Ref Range Status  11/18/2023 4.7 3.5 - 5.3 mmol/L Final         Failed - Cr in normal range and within 180 days    Creat  Date Value Ref Range Status  11/18/2023 0.64 0.50 - 0.99 mg/dL Final   Creatinine, Urine  Date Value Ref Range Status  02/22/2024 168 20 - 275 mg/dL Final         Failed - Na in normal range and within 180 days    Sodium  Date Value Ref Range Status  11/18/2023 139 135 - 146 mmol/L Final  01/31/2021 137 134 - 144 mmol/L Final         Failed - Valid encounter within last 6 months    Recent Outpatient Visits           7 months ago Primary hypertension   [place] [facility] Leavy Mole, PA-C   9 months ago Primary hypertension   [facility] Le[facility] Mole, PA-C   10 months ago Type 2 diabetes mellitus without complication, without long-term current use of insulin [facility])   [place] [facility] [place], Mole, PA-C              Passed - Patient is not pregnant      Passed - Last BP in normal range    BP Readings from Last 1 Encounters:  02/15/24 136/76

## 2024-10-27 ENCOUNTER — Encounter: Admitting: Internal Medicine
